# Patient Record
Sex: Male | Born: 1940
Health system: Southern US, Community
[De-identification: ages and names within clinical notes are randomized; demographics above are authoritative.]

## PROBLEM LIST (undated history)

## (undated) DIAGNOSIS — K219 Gastro-esophageal reflux disease without esophagitis: Secondary | ICD-10-CM

## (undated) DIAGNOSIS — I219 Acute myocardial infarction, unspecified: Secondary | ICD-10-CM

## (undated) DIAGNOSIS — J45909 Unspecified asthma, uncomplicated: Secondary | ICD-10-CM

## (undated) DIAGNOSIS — Z8701 Personal history of pneumonia (recurrent): Secondary | ICD-10-CM

## (undated) DIAGNOSIS — T8092XA Unspecified transfusion reaction, initial encounter: Secondary | ICD-10-CM

## (undated) DIAGNOSIS — E538 Deficiency of other specified B group vitamins: Principal | ICD-10-CM

## (undated) DIAGNOSIS — E039 Hypothyroidism, unspecified: Secondary | ICD-10-CM

## (undated) DIAGNOSIS — N289 Disorder of kidney and ureter, unspecified: Secondary | ICD-10-CM

## (undated) DIAGNOSIS — D472 Monoclonal gammopathy: Secondary | ICD-10-CM

## (undated) DIAGNOSIS — E785 Hyperlipidemia, unspecified: Secondary | ICD-10-CM

## (undated) DIAGNOSIS — I10 Essential (primary) hypertension: Secondary | ICD-10-CM

## (undated) HISTORY — DX: Deficiency of other specified B group vitamins: E53.8

## (undated) HISTORY — DX: Monoclonal gammopathy: D47.2

## (undated) HISTORY — DX: Personal history of pneumonia (recurrent): Z87.01

---
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
## 1977-11-22 DIAGNOSIS — T8092XA Unspecified transfusion reaction, initial encounter: Secondary | ICD-10-CM

## 1977-11-22 HISTORY — DX: Unspecified transfusion reaction, initial encounter: T80.92XA

## 1977-11-22 HISTORY — PX: CHOLECYSTECTOMY: SHX55

## 2001-03-22 ENCOUNTER — Ambulatory Visit (HOSPITAL_COMMUNITY): Admission: RE | Admit: 2001-03-22 | Discharge: 2001-03-22 | Payer: Self-pay | Admitting: Family Medicine

## 2001-03-22 ENCOUNTER — Encounter: Payer: Self-pay | Admitting: Family Medicine

## 2001-03-28 ENCOUNTER — Ambulatory Visit (HOSPITAL_COMMUNITY): Admission: RE | Admit: 2001-03-28 | Discharge: 2001-03-28 | Payer: Self-pay | Admitting: Family Medicine

## 2001-03-28 ENCOUNTER — Encounter: Payer: Self-pay | Admitting: Family Medicine

## 2001-04-10 ENCOUNTER — Encounter: Payer: Self-pay | Admitting: Family Medicine

## 2001-04-10 ENCOUNTER — Ambulatory Visit (HOSPITAL_COMMUNITY): Admission: RE | Admit: 2001-04-10 | Discharge: 2001-04-10 | Payer: Self-pay | Admitting: Family Medicine

## 2001-05-30 ENCOUNTER — Encounter: Payer: Self-pay | Admitting: *Deleted

## 2001-05-30 ENCOUNTER — Emergency Department (HOSPITAL_COMMUNITY): Admission: EM | Admit: 2001-05-30 | Discharge: 2001-05-31 | Payer: Self-pay | Admitting: *Deleted

## 2001-05-31 ENCOUNTER — Ambulatory Visit (HOSPITAL_COMMUNITY): Admission: RE | Admit: 2001-05-31 | Discharge: 2001-05-31 | Payer: Self-pay | Admitting: Internal Medicine

## 2001-05-31 ENCOUNTER — Encounter (INDEPENDENT_AMBULATORY_CARE_PROVIDER_SITE_OTHER): Payer: Self-pay | Admitting: Internal Medicine

## 2001-06-20 ENCOUNTER — Other Ambulatory Visit: Admission: RE | Admit: 2001-06-20 | Discharge: 2001-06-20 | Payer: Self-pay | Admitting: General Surgery

## 2001-07-12 ENCOUNTER — Ambulatory Visit (HOSPITAL_COMMUNITY): Admission: RE | Admit: 2001-07-12 | Discharge: 2001-07-12 | Payer: Self-pay | Admitting: Cardiology

## 2001-11-02 ENCOUNTER — Encounter: Payer: Self-pay | Admitting: *Deleted

## 2001-11-02 ENCOUNTER — Emergency Department (HOSPITAL_COMMUNITY): Admission: EM | Admit: 2001-11-02 | Discharge: 2001-11-02 | Payer: Self-pay | Admitting: *Deleted

## 2001-11-05 ENCOUNTER — Encounter: Payer: Self-pay | Admitting: *Deleted

## 2001-11-05 ENCOUNTER — Inpatient Hospital Stay (HOSPITAL_COMMUNITY): Admission: EM | Admit: 2001-11-05 | Discharge: 2001-11-09 | Payer: Self-pay | Admitting: *Deleted

## 2001-11-22 HISTORY — PX: STOMACH SURGERY: SHX791

## 2001-12-25 ENCOUNTER — Encounter: Payer: Self-pay | Admitting: General Surgery

## 2001-12-26 ENCOUNTER — Observation Stay (HOSPITAL_COMMUNITY): Admission: RE | Admit: 2001-12-26 | Discharge: 2001-12-27 | Payer: Self-pay | Admitting: General Surgery

## 2002-02-06 ENCOUNTER — Other Ambulatory Visit: Admission: RE | Admit: 2002-02-06 | Discharge: 2002-02-06 | Payer: Self-pay | Admitting: General Surgery

## 2003-05-31 ENCOUNTER — Emergency Department (HOSPITAL_COMMUNITY): Admission: EM | Admit: 2003-05-31 | Discharge: 2003-05-31 | Payer: Self-pay | Admitting: Emergency Medicine

## 2003-11-23 HISTORY — PX: HERNIA REPAIR: SHX51

## 2004-12-28 ENCOUNTER — Emergency Department (HOSPITAL_COMMUNITY): Admission: EM | Admit: 2004-12-28 | Discharge: 2004-12-28 | Payer: Self-pay | Admitting: Emergency Medicine

## 2005-11-29 ENCOUNTER — Ambulatory Visit (HOSPITAL_COMMUNITY): Admission: RE | Admit: 2005-11-29 | Discharge: 2005-11-29 | Payer: Self-pay | Admitting: Family Medicine

## 2005-12-18 ENCOUNTER — Emergency Department (HOSPITAL_COMMUNITY): Admission: EM | Admit: 2005-12-18 | Discharge: 2005-12-18 | Payer: Self-pay | Admitting: Emergency Medicine

## 2006-01-12 ENCOUNTER — Ambulatory Visit (HOSPITAL_COMMUNITY): Admission: RE | Admit: 2006-01-12 | Discharge: 2006-01-12 | Payer: Self-pay | Admitting: Family Medicine

## 2006-01-12 ENCOUNTER — Inpatient Hospital Stay (HOSPITAL_COMMUNITY): Admission: AD | Admit: 2006-01-12 | Discharge: 2006-01-15 | Payer: Self-pay | Admitting: Internal Medicine

## 2006-02-17 ENCOUNTER — Inpatient Hospital Stay (HOSPITAL_COMMUNITY): Admission: EM | Admit: 2006-02-17 | Discharge: 2006-02-19 | Payer: Self-pay | Admitting: Emergency Medicine

## 2006-03-15 ENCOUNTER — Ambulatory Visit (HOSPITAL_COMMUNITY): Admission: RE | Admit: 2006-03-15 | Discharge: 2006-03-15 | Payer: Self-pay | Admitting: Family Medicine

## 2006-04-21 ENCOUNTER — Encounter (HOSPITAL_COMMUNITY): Admission: RE | Admit: 2006-04-21 | Discharge: 2006-05-21 | Payer: Self-pay | Admitting: Endocrinology

## 2007-06-19 ENCOUNTER — Ambulatory Visit (HOSPITAL_COMMUNITY): Admission: RE | Admit: 2007-06-19 | Discharge: 2007-06-19 | Payer: Self-pay | Admitting: Family Medicine

## 2008-03-01 ENCOUNTER — Emergency Department (HOSPITAL_COMMUNITY): Admission: EM | Admit: 2008-03-01 | Discharge: 2008-03-01 | Payer: Self-pay | Admitting: Emergency Medicine

## 2008-03-03 ENCOUNTER — Emergency Department (HOSPITAL_COMMUNITY): Admission: EM | Admit: 2008-03-03 | Discharge: 2008-03-03 | Payer: Self-pay | Admitting: Emergency Medicine

## 2008-03-12 ENCOUNTER — Ambulatory Visit (HOSPITAL_COMMUNITY): Admission: RE | Admit: 2008-03-12 | Discharge: 2008-03-12 | Payer: Self-pay | Admitting: Family Medicine

## 2008-03-28 ENCOUNTER — Ambulatory Visit (HOSPITAL_COMMUNITY): Admission: RE | Admit: 2008-03-28 | Discharge: 2008-03-28 | Payer: Self-pay | Admitting: Internal Medicine

## 2008-03-28 ENCOUNTER — Encounter: Payer: Self-pay | Admitting: Internal Medicine

## 2008-03-28 ENCOUNTER — Ambulatory Visit: Payer: Self-pay | Admitting: Internal Medicine

## 2008-03-28 HISTORY — PX: COLONOSCOPY: SHX174

## 2008-12-05 ENCOUNTER — Ambulatory Visit (HOSPITAL_COMMUNITY): Admission: RE | Admit: 2008-12-05 | Discharge: 2008-12-05 | Payer: Self-pay | Admitting: Family Medicine

## 2010-12-13 ENCOUNTER — Encounter: Payer: Self-pay | Admitting: Endocrinology

## 2011-04-06 NOTE — Op Note (Signed)
NAMEMARILYN, [name]                ACCOUNT NO.:  [id number]   MEDICAL RECORD NO.:  [id number]          PATIENT TYPE:  AMB   LOCATION:  DAY                           FACILITY:  [facility]   PHYSICIAN:  R. Roetta Sessions, M.D. DATE OF BIRTH:  [birth year]   DATE OF PROCEDURE:  03/28/2008  DATE OF DISCHARGE:                               OPERATIVE REPORT   INDICATIONS FOR PROCEDURE:  A 70 year old gentleman seen in courtesy of  Dr. Nobie Putnam for colorectal cancer screening.  He is devoid of any lower  GI tract symptoms.  He has had a sigmoidoscopy by me back in 2000, which  revealed diverticula.  No family history of colorectal neoplasia.  Colonoscopy is now being done as standard screening maneuver.  Risks,  benefits, alternatives, and limitations have been reviewed, and  questions were answered.  He is agreeable.  Please see documentation in  the medical record.   PROCEDURE NOTE:  O2 saturation, blood pressure, pulse, and respirations  were monitored throughout the entire procedure.   CONSCIOUS SEDATION:  Versed 5 mg IV and fentanyl 100 mcg IV in divided  doses.   INSTRUMENTATION:  Pentax video chip system.   FINDINGS:  Digital rectal exam revealed no abnormalities.  Endoscopic  findings:  The prep was adequate.  Colon:  Colonic mucosa was surveyed  from the rectosigmoid junction through the left transverse right colon  to this orifice, ileocecal valve, and cecum.  These structures well seen  photographed for the record.  From this level the scope was slowly  cautiously withdrawn.  All previous mentioned mucosal surfaces were  again seen.  The patient was noted to have extensive left-sided  transverse diverticula.  There is a 4-mm polyp in the mid ascending  colon, which was cold biopsied/removed totally with one pass of cold  biopsy forceps.  Remainder colonic mucosa appeared normal.  The scope  was pulled down the rectum with examination of rectal mucosa including  retroflex view of the  anal verge demonstrated no abnormalities.  The  patient tolerated the procedure well and was reactive in endoscopy.   IMPRESSION:  Normal rectum; left-sided transverse diverticula;  diminutive polyp; descending colon, status post cold biopsy removal.  The remainder of colonic mucosa appeared normal.   RECOMMENDATIONS:  1. Diverticulosis/polyp literature provided to [name].  2. Follow up on path.  3. Further recommendations to follow.      [name], M.D.  Electronically Signed     RMR/MEDQ  D:  03/28/2008  T:  03/29/2008  Job:  [id number]   cc:   Patrica Duel, M.D.  Fax: [phone]

## 2011-04-09 NOTE — Consult Note (Signed)
[name], [name]                ACCOUNT NO.:  [id number]   MEDICAL RECORD NO.:  [id number]          PATIENT TYPE:  INP   LOCATION:  A330                          FACILITY:  [facility]   PHYSICIAN:  Edward L. Juanetta Gosling, M.D.DATE OF BIRTH:  May 25, 1941   DATE OF CONSULTATION:  02/18/2006  DATE OF DISCHARGE:  02/19/2006                                   CONSULTATION   INTERNAL MEDICINE CONSULTATION:   PRIMARY CARE PHYSICIANS:  Patient of Dr. Nobie Putnam and Dr. Regino Schultze.   REASON FOR CONSULTATION:  Shortness of breath and wheezing.   HISTORY:  [name] is a 70 year old who has had a several-day history of  shortness of breath.  He says that all of this started in August 2006 while  he was visiting the [facility] area.  He said he had an episode of  wheezing, became very congested and that this has persisted intermittently  since then.  He seemed to get a little better during the wintertime but he  has had more trouble since then.  He was in the hospital in February and was  undergoing cardiac workup.  It is of note that during these episodes he  complains of a feeling of something sitting on his chest that feels like a  pressure-like chest discomfort or a concrete block.  He coughs when he has  these and they almost always occur at night although he says yesterday he  had more trouble he thinks because he was putting out fertilizer and lime in  his yard.  He had been using a nebulizer at home.  When he came to the  emergency room, he was quite tight but got better.   MEDICAL HISTORY:  His past medical history is positive for hyperlipidemia  and hypertension.  He has been on Accupril but he has been off now for about  a month so I do not think that this is any form of ACE-related angioedema or  cough.  He has a history of several surgeries including gallbladder surgery  that apparently was complicated, surgery for abdominal obstruction that  apparently was related to lipomas and a history  of hernia surgery.   MEDICATIONS:  He has been on cephalexin, Vicodin, Singulair 10 mg daily,  Norvasc 5 mg daily, metoprolol 25 mg daily.   ALLERGIES:  He is allergic to [facility].   SOCIAL HISTORY:  He is retired.  He was a Chartered loss adjuster.  He never smoked.  He says he drinks alcohol maybe once every 2 months with a glass of wine.  He has had no drug use.  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   REVIEW OF SYSTEMS:  Review of systems otherwise is pretty much negative  except he says these things happen at night and he thinks he may have some  reflux symptoms.   PHYSICAL EXAMINATION:  GENERAL:  He is a well-developed, well-nourished male  who is in no acute distress now.  VITAL SIGNS:  Temperature is 98, pulse 119, respirations 20, blood pressure  103/66, O2 saturation is 98% on 2 L.  HEENT:  Pupils are equal, round, react to light and accommodation.  His nose  and throat are clear.  His neck is supple without masses.  He does not have  any jugular venous distension.  CHEST:  His chest is actually clear now.  His heart is regular without  gallop.  ABDOMEN:  Soft without masses.  EXTREMITIES:  No edema.  CNS:  Examination is grossly intact.   STUDIES:  Chest x-ray no active disease.  He had a CT angio of the chest in  February of pulmonary embolism protocol which showed normal pulmonary  arteries but did show an area that his thyroid did not look exactly normal.  He had echocardiogram that apparently was normal.   ASSESSMENT AND PLAN:  He has got shortness of breath.  I am concerned that  this may be reflux and aspiration or at least reflux causing bronchospasm.  I agree with use of Protonix.  He may need a GI consultation.  I agree with  going ahead with PFTs.  I do not think there is anything to add as far as  his therapy is concerned.      Edward L.  Juanetta Gosling, M.D.  Electronically Signed     ELH/MEDQ  D:  02/18/2006  T:  02/19/2006  Job:  [id number]

## 2011-04-09 NOTE — Op Note (Signed)
[facility]  Patient:    [name], [name] Visit Number: [id number] MRN: [id number]          Service Type: DSU Location: 3A A336 01 Attending Physician:  Dessa Phi Dictated by:   Elpidio Anis, M.D. Admit Date:  12/26/2001                             Operative Report  PREOPERATIVE DIAGNOSIS:  Left inguinal hernia.  POSTOPERATIVE DIAGNOSIS:  Left inguinal hernia.  PROCEDURE:  Mesh graft repair of left inguinal hernia.  SURGEON:  Elpidio Anis, M.D.  DESCRIPTION:  Under spinal anesthesia, the left inguinal area was prepped and draped in a sterile field.  A curvilinear lower inguinal incision was made. The incision was extended through the aponeurosis.  The aponeurosis was opened.  The cord was mobilized.  There was a very large amount of fat involving the cord.  There were two large lipomas of the cord.  The larger one was the protruding point, which appeared to be hernia.  It was explored, and there was no evidence of hernia sac.  It was dissected back to the internal ring, suture ligated, and excised. A small lipoma of the cord was excised in a similar manner.  Further exploration revealed no evidence of indirect sac. The inguinal floor was markedly attenuated.  A Marlex mesh graft was chosen. It was sutured to the Coopers ligament and the iliopubic tract and the conjoined tendon using running 0 Prolene.  The cord was placed in anatomic position.  The aponeurosis was closed with 3-0 ______.  The skin was closed with subcuticular 4-0 Dexon.  The patient tolerated the procedure well.  Local infiltration with 0.5% Marcaine with epinephrine was carried out.  A sterile dressing was placed.  He was awakened from anesthesia and transferred to a bed and taken to the postanesthetic care unit. Dictated by:   Elpidio Anis, M.D. Attending Physician:  Dessa Phi DD:  12/26/01 TD:  12/26/01 Job: [id number]

## 2011-04-09 NOTE — Discharge Summary (Signed)
[name], [name]                ACCOUNT NO.:  [id number]   MEDICAL RECORD NO.:  [id number]          PATIENT TYPE:  INP   LOCATION:  A208                          FACILITY:  [facility]   PHYSICIAN:  Madelin Rear. Sherwood Gambler, MD  DATE OF BIRTH:  [birth year]   DATE OF ADMISSION:  01/12/2006  DATE OF DISCHARGE:  02/24/2007LH                                 DISCHARGE SUMMARY   DISCHARGE MEDICATIONS:  1.  Keflex 500 mg t.i.d.  2.  Zithromax pack x5 days.  3.  Albuterol two puffs four times a day.  4.  Prednisone taper over 7 days.  5.  Metoprolol 12.5 mg p.o. b.i.d.  6.  Norvasc 5 mg p.o. daily.  7.  Accupril 5 mg p.o. daily.   DISCHARGE DIAGNOSES:  1.  Hypertension.  2.  Chest pain, noncardiac.  3.  Dyspnea secondary to exacerbation of bronchospasm.  4.  Hyperlipidemia.  5.  Hyperglycemia.   HOSPITAL COURSE:  The patient was admitted with shortness of breath and  orthopnea.  He also had some atypical chest pain and was worked up in depth  by cardiology.  He improved gratifyingly through interventions and was  discharged after successful treatment with bronchodilators and antibiotics.   FOLLOW UP:  Follow up in the office.      Madelin Rear. Sherwood Gambler, MD  Electronically Signed     LJF/MEDQ  D:  02/08/2006  T:  02/08/2006  Job:  [id number]

## 2011-04-09 NOTE — H&P (Signed)
NAMEYOBANI, SCHERTZER                ACCOUNT NO.:  [id number]   MEDICAL RECORD NO.:  [id number]          PATIENT TYPE:  INP   LOCATION:  A208                          FACILITY:  [facility]   PHYSICIAN:  Madelin Rear. Sherwood Gambler, MD  DATE OF BIRTH:  28-May-1941   DATE OF ADMISSION:  01/12/2006  DATE OF DISCHARGE:  LH                                HISTORY & PHYSICAL   CHIEF COMPLAINT:  [name] is admitted for shortness of breath,  orthopnea, and chest tightness.   HISTORY OF PRESENT ILLNESS:  Mr. Fedora is a 70 year old white male who is  admitted from our office with a four week history of cough, congestion, and  wheezing.  He has orthopnea and has to sleep in a recliner.  He also has  some chest tightness when he lays down.  The tightness goes away when  standing or sitting.  He has also had some associated head congestion  without mucus production.  He has also had some itching along his trunk off  and on for the past month.  He was seen in the emergency room and given I  believe a nebulizer treatment approximately one month ago with some relief.   REVIEW OF SYSTEMS:  He denies any fevers, chills, weight loss, or night  sweats.  No sore throat or earache.  No productive cough.  No nausea,  vomiting, constipation, diarrhea.  No reflux symptoms or abdominal pain.  No  urinary symptoms are reported.   PAST MEDICAL HISTORY:  1.  Hypertension.  2.  Hyperlipidemia.   PAST SURGICAL HISTORY:  1.  Tonsillectomy.  2.  Appendectomy.  3.  Cholecystectomy.   FAMILY HISTORY:  His dad is alive at 64 years old and has CHF and other  chronic medical conditions and is in declining health.  His mom died at 3  years old and had coronary artery disease.  Within the past year, he has  experienced the death of one of his sisters and a brother most recently in  the past two months.  His other siblings are alive and well except with  diagnosis of hypertension.  No diabetes or cancers are reported.   SOCIAL HISTORY:  Negative for tobacco, alcohol, or drug use.  He is retired  as a Chartered loss adjuster.  He is married with one son.   CURRENT MEDICATIONS:  Accupril 5 mg p.o. daily.   ALLERGIES:  1.  DEMEROL.  2.  AUGMENTIN.  3.  PHENERGAN.  4.  LEVAQUIN.  5.  He states STEROIDS cause fluid retention.   PHYSICAL EXAMINATION:  GENERAL:  He is alert and oriented, in no acute  distress.  Is slightly tearful when talking about the recent death in his  family.  VITAL SIGNS:  Temperature is 96.8, blood pressure is 140/84, weight is 206.  HEENT:  PERRLA.  EOMI.  Buccal mucosa is moist without lesions.  He does  have some post-nasal drip and throat is erythematous without exudate.  NECK:  Supple without adenopathy.  HEART:  Regular rate and rhythm without murmur.  LUNGS:  He does have  a few scattered wheezes throughout the lung fields.  ABDOMEN:  Bowel sounds are positive, soft, nontender, no masses or  organomegaly.  He does have well-healed surgical scars from the  cholecystectomy.  EXTREMITIES:  No cyanosis, clubbing, or edema.  BACK:  Negative for CVA tenderness.  SKIN:  Without rash, warm, and dry.   LABORATORY DATA:  EKG in our office showed nonspecific ST changes which is  slightly different from past examination in June 2006, in our office.  Chest  x-ray was negative for acute findings and no evidence of CHF present.   ASSESSMENT:  1.  Orthopnea.  2.  Dyspnea.  3.  Chest pain.  4.  Hypertension.  5.  Wheezing.   PLAN:  He is admitted for labs, including CBC, CMET, cardiac enzymes,  troponin, and D-dimer.  He will be given Solu-Medrol 20 mg intravenously  q.6h., and Xopenex 1.25 mg per nebulizer q.i.d.  Zithromax 500 mg daily,  Keflex 500 mg q.i.d., Accupril 5 mg p.o. daily, Atarax 25 mg p.o. q.i.d. for  itching, and consult Dr. Domingo Sep in the morning.      Melony Overly, PA      Madelin Rear. Sherwood Gambler, MD  Electronically Signed    TH/MEDQ  D:  01/12/2006  T:  01/12/2006   Job:  [id number]

## 2011-04-09 NOTE — H&P (Signed)
[facility]  Patient:    [name], [name] Visit Number: [id number] MRN: [id number]          Service Type: MED Location: 3A [phone] Attending Physician:  Kirk Ruths Dictated by:   Elpidio Anis, M.D. Admit Date:  11/05/2001 Discharge Date: 11/09/2001   CC:         Patrica Duel, M.D.   History and Physical  CHIEF COMPLAINT:  This is a 70 year old male, with a history of acute onset of pain and swelling in the left inguinal area.  HISTORY OF PRESENT ILLNESS:  The patient noted increased swelling with coughing, straining, and Valsalva movements.  He was seen by Dr. Nobie Putnam and referred to the office, where examination revealed a left inguinal hernia. Because of increasing symptoms he is scheduled for left inguinal hernia repair.  PAST MEDICAL HISTORY:  1. Hypertension.  2. Diagnosis of mesenteric adenitis, diagnosed in July 2001.  He has mild     baseline abdominal discomfort.  3. He was recently treated for pneumonia in December 2002 and this has     resolved.  4. History of peptic ulcer disease.  5. Diverticulosis diagnosed on sigmoidoscopy.  PAST SURGICAL HISTORY:  1. Exploratory laparotomy.  2. Cholecystectomy.  3. Hemorrhoidectomy.  4. Appendectomy.  ALLERGIES:  1. MEPERGAN.  2. CODEINE.  SOCIAL HISTORY:  He is married.  He does not drink or smoke.  He is a retired Merchandiser, retail.  FAMILY HISTORY:  Unremarkable.  PHYSICAL EXAMINATION:  VITAL SIGNS:  Blood pressure 130/80, pulse 80, respirations 20, temperature 98 degrees.  HEENT:  No abnormality noted.  NECK:  Supple.  No JVD or bruits.  No thyromegaly or adenopathy.  CHEST:  Good breath sounds bilaterally.  No rales, rubs, rhonchi, or wheezes.  HEART:  Regular rate and rhythm without murmur, gallop, or rub.  ABDOMEN:  Soft, nontender, no masses.  Normoactive bowel sounds.  Hernia easily palpable, enlarged left inguinal hernia which extends down to  the superior aspect of the scrotum.  Mild tenderness.  GU:  Genitalia reveals no testicular abnormality.  EXTREMITIES:  No joint deformity, no clubbing, cyanosis, or edema.  NEUROLOGIC:  No motor, sensory, or cerebellar deficit.  IMPRESSION:  1. Left inguinal hernia.  2. Hypertension.  3. History of mesenteric adenitis.  4. History of peptic ulcer disease.  5. Diverticulosis.  PLAN:  The patient will have left inguinal hernia repair under spinal anesthesia. Dictated by:   Elpidio Anis, M.D. Attending Physician:  Kirk Ruths DD:  12/25/01 TD:  12/25/01 Job: [id number]

## 2011-04-09 NOTE — Discharge Summary (Signed)
[facility]  Patient:    [name], [name] Visit Number: [id number] MRN: [id number]          Service Type: MED Location: 3A [phone] Attending Physician:  Kirk Ruths Dictated by:   Karleen Hampshire, M.D. Admit Date:  11/05/2001 Discharge Date: 11/09/2001                             Discharge Summary  DISCHARGE DIAGNOSES: 1. Respiratory stress secondary to severe bronchitis. 2. History of hypertension. 3. Left inguinal hernia.  HOSPITAL COURSE:  This 70 year old male was admitted to the emergency room and was seen there 4 days before admission.  She was treated with Augmentin, Robitussin, for upper respiratory infection.  The patient had progressive fever, aches, and chills with productive cough and shortness of breath.  On presentation on the day of admission he had a pO2 of 59 on room air.  X-ray was normal.  White count was 13,000.  The patient was admitted for atypical pneumonia and respiratory toiletry.  The patient on admission was given Solu-Medrol a one time dose and begun on IV Levaquin, and intensive nebulizer treatments and mucolytics.  The patient remained afebrile throughout his stay although his respiratory status was slow and improving.  He initially was placed also on p.o. prednisone 30 mg q.d. and at the time of discharge his lungs had cleared and no respiratory distress.  DISPOSITION/MEDICATIONS:  He is discharged home on his previous medications which included: 1. Accupril for hypertension. 2. Continued on Levaquin for 7 more days 3. Robitussin.  FOLLOWUP:  He will be followed in the office as needed. Dictated by:   Karleen Hampshire, M.D. Attending Physician:  Kirk Ruths DD:  11/09/01 TD:  11/09/01 Job: [id number]

## 2011-04-09 NOTE — Procedures (Signed)
NAMEBRYDON, [name]                ACCOUNT NO.:  [id number]   MEDICAL RECORD NO.:  [id number]          PATIENT TYPE:  INP   LOCATION:  A208                          FACILITY:  [facility]   PHYSICIAN:  Dani Gobble, MD       DATE OF BIRTH:  October 19, 1941   DATE OF PROCEDURE:  01/13/2006  DATE OF DISCHARGE:                                  ECHOCARDIOGRAM   REFERRING PHYSICIAN:  Madelin Rear. Sherwood Gambler, M.D./Ann Domingo Sep, M.D.   INDICATIONS:  A 70 year old gentleman with past medical history of  hypertension who was admitted with chest pain and shortness breath as well  as dyspnea on exertion who is referred for evaluation of LV function.   The technical quality of the study is reasonable.   M-MODE TRACINGS:  The aorta measures normally at 3.3 cm.   The left atrium is at the upper limits of normal at 4.1 cm.  No obvious  clots, masses were appreciated and the patient appeared to be in borderline  sinus tachycardia during the procedure.   The interventricular septum and posterior wall are mildly thickened measured  at 1.4 cm and 1.3 cm, respectively.   The aortic valve is not well-visualized, but is probably a trileaflet valve.  Leaflet excursion appears normal.  No significant aortic insufficiency is  noted.  Doppler interrogation of the aortic valve is within normal limits.   Mitral valve is not well-visualized, but appeared to be grossly structurally  normal.  No obvious mitral valve prolapse is appreciated. Mild mitral  regurgitation is noted.  Doppler interrogation of mitral valve reveals  minimally elevated velocities across the valve itself.   Pulmonic valve is not well-visualized.   Tricuspid valve appears grossly structurally normal.  Mild tricuspid  regurgitation is noted.   The left ventricle is normal in size with the LVIDD measured at 4.2 cm and  the LVISD measured at 2.3 cm.  Overall, left ventricular systolic function  appears quite vigorous to hyperdynamic.  No regional wall  motion  abnormalities are noted.  The presence of diastolic dysfunction is inferred  from pulse wave Doppler across the mitral valve.   The right atrium appears normal in size and the right ventricle appears  moderately dilated with moderate to marked RVH.  Right ventricle systolic  function is normal.   There is a suggestion of a small anterior echo-free space also at the apex  which may represent a fat pad versus a small pericardial effusion.  If  indeed it is a pericardial effusion, there is no hemodynamic compromise.  Additionally, the pericardium itself appears thickened, although not  particularly echogenic.   IMPRESSION:  1.  Left atrium at the upper limits of normal.  2.  Mild concentric left ventricular hypertrophy.  3.  The mitral valve structurally appears reasonably normal, although the      velocities across the valve on Doppler interrogation are mildly      elevated.  There does not appear to be limitation of leaflet excursion      nor does there appear to be significant prolapse.  Mild mitral  regurgitation is noted.  4.  Mild tricuspid regurgitation.  5.  Normal left ventricular size with vigorous to hyperdynamic left      ventricular systolic function.  No regional wall motion abnormalities      noted.  6.  Presence of diastolic dysfunction is inferred from pulsar Doppler across      the mitral valve.  7.  Moderate right ventricular enlargement with moderate to marked right      ventricular hypertrophy and normal right ventricle systolic function.  8.  There appears to be an echo-free space anteriorly and at the apex which      may represent fat pad versus a small pericardial effusion. If it indeed      it is a pericardial effusion, there is no hemodynamic compromise.  9.  Additionally, the pericardium appears itself to be thickened, although      not particularly echogenic.           ______________________________  Dani Gobble, MD     AB/MEDQ  D:   01/13/2006  T:  01/13/2006  Job:  [id number]

## 2011-04-09 NOTE — Discharge Summary (Signed)
NAMEPAULINE, [name]                ACCOUNT NO.:  [id number]   MEDICAL RECORD NO.:  [id number]          PATIENT TYPE:  INP   LOCATION:  A330                          FACILITY:  [facility]   PHYSICIAN:  Kirk Ruths, M.D.DATE OF BIRTH:  11-25-1940   DATE OF ADMISSION:  02/17/2006  DATE OF DISCHARGE:  03/31/2007LH                                 DISCHARGE SUMMARY   DISCHARGE DIAGNOSES:  1.  Status asthmaticus.  2.  Gastroesophageal reflux disease.  3.  Nontoxic thyroid goiter.  4.  Hypertension.   HOSPITAL COURSE:  This is a 70 year old male who had been previously  healthy.  The patient was admitted approximately a month ago for similar  problem breathing and while improved, he is gotten progressively worse over  the last several days.  The patient's blood gases on room air showed a pH  7.29, pCO2 43, pO2 67 on admission.  White count was 11,900 with a  hemoglobin of 14.6.  D-dimer was in the upper limits of 48.  Electrolytes  were within normal range.  Cardiac enzymes were negative.  The patient's  thyroid studies showed TSH slightly low at 0.052.  Free T4 of 1.51, free T3  of 2.8.  Thyroglobulin antibodies were 32.9 which is elevated.  Should be  about 20.  Thyroglobulin was 24.2, within normal range.  The patient's chest  x-ray showed no active disease or infiltrates .   The patient was admitted to the floor and put on IV Solu-Medrol, Xopenex  nebulizers, Xanax, Norvasc, Singulair, and supplemental oxygen.  He was seen  in consultation by Dr. Kari Baars from pulmonary who helped adjust his  medications.  Dr. Juanetta Gosling felt possibly COPD or asthma could be exacerbated  by silent GERD for which he was placed on Protonix.  The patient also was  noted on his previous admission to have a __________  thyroid. Ultrasound at  that time showed a 4.5 x 3 x 3.3 cm, mid/inferior left thyroid nodule,  He  was seen in consultation by Dr. Patrecia Pace for his thyroid problem.  Thyroid  __________   were ordered.  He will be followed by Dr. Patrecia Pace as an  outpatient for further definition of his problem.  The patient improved. He  had significant improvement in his wheezing, maintaining his oxygen  saturations.  He underwent pulmonary function studies which are consistent  with asthma.   CONDITION ON DISCHARGE:  The patient was discharged home in stable  condition.   DISCHARGE MEDICATIONS:  1.  Protonix was added to his regimen.  2.  Singulair 10 mg daily.  3.  Asmanex .  4.  Xopenex nebulizers.  5.  ProAir .   FOLLOW UP:  He will be followed in the office as needed.  An appointment was  made for him.  He is supposed to call Dr. Patrecia Pace office for an  appointment.      Kirk Ruths, M.D.  Electronically Signed     WMM/MEDQ  D:  04/14/2006  T:  04/14/2006  Job:  [id number]

## 2011-04-09 NOTE — H&P (Signed)
NAMEWYETT, NARINE                ACCOUNT NO.:  [id number]   MEDICAL RECORD NO.:  [id number]          PATIENT TYPE:  INP   LOCATION:  A330                          FACILITY:  [facility]   PHYSICIAN:  Kirk Ruths, M.D.DATE OF BIRTH:  [birth year]   DATE OF ADMISSION:  02/17/2006  DATE OF DISCHARGE:  LH                                HISTORY & PHYSICAL   CHIEF COMPLAINT:  Shortness of breath.   HISTORY OF PRESENT ILLNESS:  This is a 70 year old male who has had a  several day history of progressively shortness of breath. He had a similar  admission approximately a month ago. The patient states his home medications  were not relieving his shortness of breath. He presented to the emergency  room where he was found to be extremely tight with junky sounding congested  lungs. The patient finally responded to second nebulizer treatment and  morphine and Zofran. His blood gases are abnormal, and with the recurrent  nature of this significant problem, he is admitted for further evaluation  and treatment.   PAST MEDICAL HISTORY:  1.  He has a history of hyperlipidemia.  2.  Hypertension.  3.  Previous history of asthmatic bronchitis.   MEDICATIONS:  1.  Cephalexin.  2.  Vicodin as needed.  3.  Singulair 10 mg daily.  4.  Norvasc 5 mg daily.  5.  Metoprolol 25 once a day.   ALLERGIES:  The patient states he is allergic to [facility] IV; it makes him  go blind and wild.   REVIEW OF SYSTEMS:  Denies chest pain or nausea or vomiting or diarrhea.   SOCIAL HISTORY:  The patient is retired married state trooper who has never  smoked or drank or had drug use.   PHYSICAL EXAMINATION:  GENERAL:  Well-developed, middle-aged male who is  apprehensive.  VITAL SIGNS:  He is afebrile. Blood pressure is 140/80, pulse is 100 and  regular. Respirations 24 and somewhat shallow. O2 saturations are 98% on 2  liters.  HEENT:  TMs are normal. Pupils are equal and reactive to accommodation.  Oropharynx  benign.  NECK:  Supple without JVD, bruit or thyromegaly.  LUNGS:  Diffuse wheezing and rhonchi throughout.  HEART:  Regular sinus rhythm.  ABDOMEN:  Soft and nontender.  EXTREMITIES:  Without clubbing, cyanosis, or edema.  NEUROLOGICAL:  Grossly intact.   ASSESSMENT:  1.  Asthmatic bronchitis.  2.  Hypertension.   PLAN:  Will admit. Will hold Lopressor. Treat with IV steroids and  nebulizers. Pulmonary consult.      Kirk Ruths, M.D.  Electronically Signed     WMM/MEDQ  D:  02/17/2006  T:  02/18/2006  Job:  [id number]

## 2011-04-09 NOTE — Procedures (Signed)
[name], [name]                ACCOUNT NO.:  [id number]   MEDICAL RECORD NO.:  [id number]          PATIENT TYPE:  INP   LOCATION:  A330                          FACILITY:  [facility]   PHYSICIAN:  Edward L. Juanetta Gosling, M.D.DATE OF BIRTH:  October 09, 1941   DATE OF PROCEDURE:  DATE OF DISCHARGE:  02/19/2006                              PULMONARY FUNCTION TEST   1.  Spirometry shows a mild ventilatory defect with evidence of airflow      obstruction.  2.  Lung volumes are normal.  3.  DLCO is normal.  4.  There is significant bronchodilator response.      Edward L. Juanetta Gosling, M.D.  Electronically Signed     ELH/MEDQ  D:  02/21/2006  T:  02/21/2006  Job:  [id number]   cc:   Kirk Ruths, M.D.  Fax: [phone]

## 2011-04-09 NOTE — Group Therapy Note (Signed)
NAMEHERSON, PRICHARD                ACCOUNT NO.:  [id number]   MEDICAL RECORD NO.:  [id number]          PATIENT TYPE:  INP   LOCATION:  A330                          FACILITY:  [facility]   PHYSICIAN:  Edward L. Juanetta Gosling, M.D.DATE OF BIRTH:  03-15-1941   DATE OF PROCEDURE:  02/19/2006  DATE OF DISCHARGE:  02/19/2006                                   PROGRESS NOTE   Patient of Dr. Edison Simon.   Mr. Stamos says he feels great and wants to go home.  He had his pulmonary  function test done yesterday and it did show that he had what appears to be  asthma.  I think still he has asthma that is precipitated by him having  reflux and I think we should treat the reflux.  I have discussed all this  with the patient and with Dr. Regino Schultze.  Plan is for him to be discharged  home today on Protonix 40 mg daily, to continue with his other medications  as before.      Edward L. Juanetta Gosling, M.D.  Electronically Signed     ELH/MEDQ  D:  02/19/2006  T:  02/21/2006  Job:  [id number]

## 2011-04-09 NOTE — H&P (Signed)
[facility]  Patient:    [name], [name] Visit Number: [id number] MRN: [id number]          Service Type: EMS Location: ED Attending Physician:  Rosalyn Charters Dictated by:   Karleen Hampshire, M.D. Admit Date:  11/02/2001 Discharge Date: 11/02/2001                           History and Physical  CHIEF COMPLAINT:  Aching and fever.  HISTORY OF PRESENT ILLNESS:  This is a 70 year old white male who had been seen in the emergency room approximately four days before admission and treated with Augmentin and Robitussin for upper respiratory tract infection. The patient has progressively had aching and fever with progressive cough.  In the emergency room, he was found to have a diminished pO2 of 59 with a pH of 7.42 and a pCO2 of 42.  The patients chest x-ray was not significant.  The white count was 13,000.  He is admitted for presumable atypical pneumonia.  ALLERGIES:  He is allergic to [facility].  It makes his throat close up in the IV form.  Also, CODEINE makes him hurt in his chest.  The patient can tolerate Vicodin.  PAST MEDICAL HISTORY:  The patient has hypertension which is controlled with Accupril 10 mg.  The patient underwent an exploratory laparotomy approximately a year ago for lysis of adhesions.  SOCIAL HISTORY:  The patient is a retired Chartered loss adjuster.  He denies smoking and alcohol use.  PHYSICAL EXAMINATION:  GENERAL APPEARANCE:  A middle-aged white male who appears miserable.  VITAL SIGNS:  Temperature 98.6 degrees, pulse 110 and regular, respirations 20 and unlabored, but swallow, blood pressure 130/75.  The patients O2 saturation on 2 L is 96%.  HEENT:  Pupils are equal to light and accommodation.  Oropharynx benign.  NECK:  Supple without JVD, bruit, or thyromegaly.  LUNGS:  Rales, wheezes, and rhonchi, especially on the right.  HEART:  Regular sinus rhythm without murmurs, rubs, or gallops.  ABDOMEN:  Soft and  nontender.  EXTREMITIES:  Without clubbing, cyanosis, or edema.  NEUROLOGIC:  Grossly intact.  ASSESSMENT: 1. Atypical pneumonia. 2. Hypertension. Dictated by:   Karleen Hampshire, M.D. Attending Physician:  Rosalyn Charters DD:  11/05/01 TD:  11/05/01 Job: [id number]

## 2011-05-06 ENCOUNTER — Ambulatory Visit (HOSPITAL_COMMUNITY)
Admission: RE | Admit: 2011-05-06 | Discharge: 2011-05-06 | Disposition: A | Discharge status: Home / Self Care | Payer: BC Managed Care – PPO | Source: Ambulatory Visit | Attending: Orthopedic Surgery | Admitting: Orthopedic Surgery

## 2011-05-06 DIAGNOSIS — R262 Difficulty in walking, not elsewhere classified: Secondary | ICD-10-CM | POA: Insufficient documentation

## 2011-05-06 DIAGNOSIS — IMO0001 Reserved for inherently not codable concepts without codable children: Secondary | ICD-10-CM | POA: Insufficient documentation

## 2011-05-06 DIAGNOSIS — M25673 Stiffness of unspecified ankle, not elsewhere classified: Secondary | ICD-10-CM | POA: Insufficient documentation

## 2011-05-06 DIAGNOSIS — M25579 Pain in unspecified ankle and joints of unspecified foot: Secondary | ICD-10-CM | POA: Insufficient documentation

## 2011-05-06 DIAGNOSIS — M25676 Stiffness of unspecified foot, not elsewhere classified: Secondary | ICD-10-CM | POA: Insufficient documentation

## 2011-05-06 DIAGNOSIS — I10 Essential (primary) hypertension: Secondary | ICD-10-CM | POA: Insufficient documentation

## 2011-05-06 DIAGNOSIS — M6281 Muscle weakness (generalized): Secondary | ICD-10-CM | POA: Insufficient documentation

## 2011-05-12 ENCOUNTER — Ambulatory Visit (HOSPITAL_COMMUNITY)
Admission: RE | Admit: 2011-05-12 | Discharge: 2011-05-12 | Disposition: A | Discharge status: Home / Self Care | Payer: BC Managed Care – PPO | Source: Ambulatory Visit | Attending: Family Medicine | Admitting: Family Medicine

## 2011-05-13 ENCOUNTER — Ambulatory Visit (HOSPITAL_COMMUNITY)
Admission: RE | Admit: 2011-05-13 | Discharge: 2011-05-13 | Disposition: A | Discharge status: Home / Self Care | Payer: BC Managed Care – PPO | Source: Ambulatory Visit | Attending: Family Medicine | Admitting: Family Medicine

## 2011-05-19 ENCOUNTER — Ambulatory Visit (HOSPITAL_COMMUNITY): Payer: BC Managed Care – PPO | Admitting: Physical Therapy

## 2011-05-24 ENCOUNTER — Ambulatory Visit (HOSPITAL_COMMUNITY)
Admission: RE | Admit: 2011-05-24 | Discharge: 2011-05-24 | Disposition: A | Discharge status: Home / Self Care | Payer: BC Managed Care – PPO | Source: Ambulatory Visit | Attending: Family Medicine | Admitting: Family Medicine

## 2011-05-24 DIAGNOSIS — M6281 Muscle weakness (generalized): Secondary | ICD-10-CM | POA: Insufficient documentation

## 2011-05-24 DIAGNOSIS — M25676 Stiffness of unspecified foot, not elsewhere classified: Secondary | ICD-10-CM | POA: Insufficient documentation

## 2011-05-24 DIAGNOSIS — M25673 Stiffness of unspecified ankle, not elsewhere classified: Secondary | ICD-10-CM | POA: Insufficient documentation

## 2011-05-24 DIAGNOSIS — M25579 Pain in unspecified ankle and joints of unspecified foot: Secondary | ICD-10-CM | POA: Insufficient documentation

## 2011-05-24 DIAGNOSIS — R262 Difficulty in walking, not elsewhere classified: Secondary | ICD-10-CM | POA: Insufficient documentation

## 2011-05-24 DIAGNOSIS — IMO0001 Reserved for inherently not codable concepts without codable children: Secondary | ICD-10-CM | POA: Insufficient documentation

## 2011-05-24 DIAGNOSIS — I10 Essential (primary) hypertension: Secondary | ICD-10-CM | POA: Insufficient documentation

## 2011-05-27 ENCOUNTER — Ambulatory Visit (HOSPITAL_COMMUNITY)
Admission: RE | Admit: 2011-05-27 | Discharge: 2011-05-27 | Disposition: A | Discharge status: Home / Self Care | Payer: BC Managed Care – PPO | Source: Ambulatory Visit | Attending: Family Medicine | Admitting: Family Medicine

## 2011-06-02 ENCOUNTER — Ambulatory Visit (HOSPITAL_COMMUNITY): Payer: BC Managed Care – PPO | Admitting: *Deleted

## 2011-06-04 ENCOUNTER — Ambulatory Visit (HOSPITAL_COMMUNITY): Payer: BC Managed Care – PPO | Admitting: Physical Therapy

## 2011-06-07 ENCOUNTER — Ambulatory Visit (HOSPITAL_COMMUNITY)
Admission: RE | Admit: 2011-06-07 | Discharge: 2011-06-07 | Disposition: A | Discharge status: Home / Self Care | Payer: BC Managed Care – PPO | Source: Ambulatory Visit | Attending: Family Medicine | Admitting: Family Medicine

## 2011-06-10 ENCOUNTER — Ambulatory Visit (HOSPITAL_COMMUNITY)
Admission: RE | Admit: 2011-06-10 | Discharge: 2011-06-10 | Disposition: A | Discharge status: Home / Self Care | Payer: BC Managed Care – PPO | Source: Ambulatory Visit | Attending: Family Medicine | Admitting: Family Medicine

## 2011-06-10 DIAGNOSIS — S93409A Sprain of unspecified ligament of unspecified ankle, initial encounter: Secondary | ICD-10-CM | POA: Insufficient documentation

## 2011-06-10 DIAGNOSIS — M25579 Pain in unspecified ankle and joints of unspecified foot: Secondary | ICD-10-CM | POA: Insufficient documentation

## 2011-06-10 NOTE — Progress Notes (Signed)
Physical Therapy Treatment Patient Name: [name] [id number] Date: 06/10/2011 Initial Eval: 05/06/11 Re-eval completed on 06/07/11, next one due 06/21/11  Time: 9:05-935 Visit: #7 Charges: 30 min TE  HPI: Symptoms/Limitations Symptoms: "I was really well after the last visit on Monday.  I felt great on Tuesday and then I went out and played golf yesterday and my ankle was all swollen so I iced it for about 90 minutes" Pain Assessment Currently in Pain?: Yes Pain Score:   5 Pain Location: Ankle Pain Orientation: Left Pain Type: Acute pain Multiple Pain Sites: No   Mobility (including Balance) Ambulation/Gait Ambulation/Gait: Yes Ambulation/Gait Assistance: 7: Independent Gait Pattern: Antalgic;Decreased stance time - left     Exercise/Treatments Bike 6 min @ 3.0 STANDING Gastroc Stretch  3x30 sec Soleus Stretch 3x30 sec. SLS BAPS Level 3 A/P, EV/IN x10 ea BAPS Level 3 Clockwise/Counterwise 5x ea Tandem Gait 5RT Toe Walks 3RT Heel walks 3 RT Rebounder 20x Red ball Squats on BOSU 2x10 SEATED  Gastroc Press 3.5PL 2x10 Goals PT Short Term Goals Short Term Goal 1: Pt will be I with HEP in order to maximize therapeutic effect Short Term Goal 1 Progress: Met Short Term Goal 2: Pt will report pain less than or equal to 2/10 for 50% of his day. Short Term Goal 2 Progress: Met Short Term Goal 3: Pt will present with decreased pain and tenderness to L ankle. Short Term Goal 3 Progress: Met PT Long Term Goals Long Term Goal 1: Pt will report pain less than or equal to 2/10 while i walking for 60 minutes in order to participate in golf activities Long Term Goal 1 Progress: Not met Long Term Goal 2: Pt will demonstrate tandem walking on uneven surface x50 ft. in order to participate safely in outdoor activities.  Long Term Goal 2 Progress: Met Long Term Goal 4: Pt will ambulate with reports of decreased edeam for improved quality of life.  Long Term Goal 4 Progress: Partly  met End of Session Patient Active Problem List  Diagnoses  . Pain in joint, ankle and foot  . Sprain of ankle, unspecified site   PT - End of Session Activity Tolerance: Patient tolerated treatment well General Behavior During Session: [facility] for tasks performed Cognition: [facility] for tasks performed PT Assessment and Plan Clinical Impression Statement: Pt was able to complete all acitivities without an increase in pain.  Pt has improved his overall static and dynamic balance and had decreased edeam today.  Still has subjective reports of edema after  strenous golf activities.  PT Plan: Cont to progress strength and add hi-volt (for edema) or IFC e-stim (for pain control) if necessary.  Pt to continue for 2 more treatments and discuss D/C.    Eldene Plocher 06/10/2011, 9:44 AM

## 2011-06-10 NOTE — Patient Instructions (Signed)
Educated patient on proper use of ice modalities

## 2011-06-16 ENCOUNTER — Ambulatory Visit (HOSPITAL_COMMUNITY)
Admission: RE | Admit: 2011-06-16 | Discharge: 2011-06-16 | Disposition: A | Discharge status: Home / Self Care | Payer: BC Managed Care – PPO | Source: Ambulatory Visit | Attending: Family Medicine | Admitting: Family Medicine

## 2011-06-16 NOTE — Progress Notes (Signed)
Physical Therapy Treatment Patient Name: [name] JYNWG'N Date: 06/16/2011  Time In: 3:30 Time Out: 4:25 Visit #: 8 Next Re-eval: 06/21/2011, next session Charge: therex 40 Retro massage: 7 min  Subjective: Symptoms/Limitations Symptoms: I am not in any pain today just sore L heel,  L ankle ankle swollen.  Received letter in mail from [employer] stating that I may possibly be taking too high a doseage, have contacted MD believe that may be partial reason for swelling.  Pain Assessment Currently in Pain?: No/denies Pain Score: 0-No pain Pain Location: Ankle Pain Orientation: Left Pain Type: Acute pain Multiple Pain Sites: No  Objective:  Exercise/Treatments Bike 6 min @ 3.0  STANDING  Gastroc Stretch 3x30 sec  Soleus Stretch 3x30 sec.  SLS  34" Rockerboard A/P, R/L 10 reps BAPS Level 3 A/P, EV/IN x10 ea  BAPS Level 3 Clockwise/Counterwise 5x ea  Tandem Gait 3RT  Toe Walks 3RT  Heel walks 3 RT  Rebounder 15x yellow ball  Squats on BOSU 2x10  SEATED  Gastroc Press 3.5PL 2x10  Additional Hip Exercises SLS:  (34") Rebounder:  (15x yellow ball L SLS) Rocker Board:  (A/P, R/L 10reps) Additional Knee Exercises Functional Squat: 20 reps;Other (comment) (on BOSU) Rocker Board:  (A/P, R/L 10reps) SLS:  (34") Rebounder:  (15x yellow ball L SLS) Ankle Exercises Ankle Dorsiflexion:  (Gastroc press 3.5 PL 2setsx 10 reps) Ankle Plantar Flexion:  (Plantar fascia st 3x 30") Additional Ankle Exercises BAPS: Level 3;10 reps;Standing (10 reps DF/PF/INV/EV, 5 reps CW, CCW) SLS:  (34") Rebounder:  (15x yellow ball L SLS) Rocker Board:  (A/P, R/L 10reps) Heel Walk (Round Trip): 3RT Toe Walk (Round Trip): 3 RT Balance Exercises Tandem Walking: 3 round trips    Goals   End of Session Patient Active Problem List  Diagnoses  . Pain in joint, ankle and foot  . Sprain of ankle, unspecified site   PT - End of Session Activity Tolerance: Patient tolerated treatment  well General Behavior During Session: [facility] for tasks performed Cognition: [facility] for tasks performed PT Assessment and Plan Clinical Impression Statement: Pt able to complete all activities with no increase in pain, minor vc required for positioning.  Pt with improved gluteal and hamstring strength and improving ankle strategy shown with rockerboard/BAPS.  Pt tender on medial soleus with prone retro massage done in plantar fascia st position. Rehab Potential: Good PT Frequency: Min 2X/week PT Plan: Re-eval next session for possible D/C to HEP.  Juel Burrow 06/16/2011, 4:55 PM

## 2011-06-17 ENCOUNTER — Inpatient Hospital Stay (HOSPITAL_COMMUNITY): Admission: RE | Admit: 2011-06-17 | Payer: BC Managed Care – PPO | Source: Ambulatory Visit

## 2011-06-22 ENCOUNTER — Ambulatory Visit (HOSPITAL_COMMUNITY)
Admission: RE | Admit: 2011-06-22 | Discharge: 2011-06-22 | Disposition: A | Discharge status: Home / Self Care | Payer: BC Managed Care – PPO | Source: Ambulatory Visit | Attending: Family Medicine | Admitting: Family Medicine

## 2011-06-22 NOTE — Progress Notes (Signed)
  Patient Name: [name] MRN: [id number] Today's Date: 06/22/2011 Time: 4098-1191 Charges: 15 min TE There-ex Bike 6 min @ 5.0 Gastroc Stretch 3x30 sec Soleous Stretch 3x30 sec Plantar fascia Stretch 3x30 sec Tandem Gait on Grass x100 ft.  Golfer lift/half kneel to stand 5x - for demonstration to decrease risk of secondary impairments.    Physical Therapy Discharge Summary  Diagnosis: L ankle pain and plantar fascitis  ICD-9 Code: 719.47, 845.09 Referring practitioner: Lestine Box Date of next MD visit: unscheduled Date of initial PT Visit: 05/06/11 Patient seen for 6 sessions  Subjective:    Patient's response to therapy: Pt reports he is able to complete a full day of work with minimal swelling. Pt is able to independently demonstrate his HEP.    Objective:   Current condition: Standing: some stiffness in the am, able to stand for hours; Walk: hours.  Pt was able to go out and golf with moderate swelling without an increase in pain to his L foot and ankle, able to tandem walk on grass surface and demonstrate half kneel to stand for proper mechanics to retrieve a golf ball without an increase in pain.  He has been able to use his HEP in order to decrease his overall pain and improve his function.  Pt is able to perform higher level balance activities without an increase in pain.       Assessment:   Summary/analysis of evaluation: Pt was referred to PT for L ankle pain and L plantar fascitis.  At his initial evaluation he had body structure impairments including increased pain, impaired balance and decreased LE flexibility which have all been resolved with outpatient PT.    Progress toward previous goals:Pt has met all STG and LTG  Plan:   Plan D/C from PT w/HEP   Mayda Shippee 06/22/2011, 12:23 PM

## 2011-12-24 DIAGNOSIS — I4949 Other premature depolarization: Secondary | ICD-10-CM | POA: Diagnosis not present

## 2011-12-24 DIAGNOSIS — I10 Essential (primary) hypertension: Secondary | ICD-10-CM | POA: Diagnosis not present

## 2011-12-24 DIAGNOSIS — E782 Mixed hyperlipidemia: Secondary | ICD-10-CM | POA: Diagnosis not present

## 2011-12-24 DIAGNOSIS — R9431 Abnormal electrocardiogram [ECG] [EKG]: Secondary | ICD-10-CM | POA: Diagnosis not present

## 2012-01-28 DIAGNOSIS — Z6830 Body mass index (BMI) 30.0-30.9, adult: Secondary | ICD-10-CM | POA: Diagnosis not present

## 2012-01-28 DIAGNOSIS — R05 Cough: Secondary | ICD-10-CM | POA: Diagnosis not present

## 2012-01-28 DIAGNOSIS — J069 Acute upper respiratory infection, unspecified: Secondary | ICD-10-CM | POA: Diagnosis not present

## 2012-03-02 DIAGNOSIS — I2510 Atherosclerotic heart disease of native coronary artery without angina pectoris: Secondary | ICD-10-CM | POA: Diagnosis not present

## 2012-03-02 DIAGNOSIS — E782 Mixed hyperlipidemia: Secondary | ICD-10-CM | POA: Diagnosis not present

## 2012-03-02 DIAGNOSIS — R9431 Abnormal electrocardiogram [ECG] [EKG]: Secondary | ICD-10-CM | POA: Diagnosis not present

## 2012-06-27 ENCOUNTER — Other Ambulatory Visit (HOSPITAL_COMMUNITY): Payer: Self-pay | Admitting: Physician Assistant

## 2012-06-27 ENCOUNTER — Ambulatory Visit (HOSPITAL_COMMUNITY)
Admission: RE | Admit: 2012-06-27 | Discharge: 2012-06-27 | Disposition: A | Discharge status: Home / Self Care | Payer: Medicare Other | Source: Ambulatory Visit | Attending: Physician Assistant | Admitting: Physician Assistant

## 2012-06-27 DIAGNOSIS — M549 Dorsalgia, unspecified: Secondary | ICD-10-CM | POA: Diagnosis not present

## 2012-06-27 DIAGNOSIS — M79609 Pain in unspecified limb: Secondary | ICD-10-CM | POA: Diagnosis not present

## 2012-06-27 DIAGNOSIS — M779 Enthesopathy, unspecified: Secondary | ICD-10-CM

## 2012-06-27 DIAGNOSIS — M7989 Other specified soft tissue disorders: Secondary | ICD-10-CM | POA: Insufficient documentation

## 2012-06-27 DIAGNOSIS — Z6829 Body mass index (BMI) 29.0-29.9, adult: Secondary | ICD-10-CM | POA: Diagnosis not present

## 2012-07-06 DIAGNOSIS — M5126 Other intervertebral disc displacement, lumbar region: Secondary | ICD-10-CM | POA: Diagnosis not present

## 2012-07-13 DIAGNOSIS — M5126 Other intervertebral disc displacement, lumbar region: Secondary | ICD-10-CM | POA: Diagnosis not present

## 2012-07-14 DIAGNOSIS — M5126 Other intervertebral disc displacement, lumbar region: Secondary | ICD-10-CM | POA: Diagnosis not present

## 2012-07-17 ENCOUNTER — Other Ambulatory Visit: Payer: Self-pay | Admitting: Specialist

## 2012-07-17 DIAGNOSIS — M5126 Other intervertebral disc displacement, lumbar region: Secondary | ICD-10-CM | POA: Diagnosis not present

## 2012-07-18 ENCOUNTER — Ambulatory Visit (HOSPITAL_COMMUNITY)
Admission: RE | Admit: 2012-07-18 | Discharge: 2012-07-18 | Disposition: A | Discharge status: Home / Self Care | Payer: Medicare Other | Source: Ambulatory Visit | Attending: Specialist | Admitting: Specialist

## 2012-07-18 ENCOUNTER — Encounter (HOSPITAL_COMMUNITY): Payer: Self-pay

## 2012-07-18 ENCOUNTER — Encounter (HOSPITAL_COMMUNITY)
Admission: RE | Admit: 2012-07-18 | Discharge: 2012-07-18 | Disposition: A | Discharge status: Home / Self Care | Payer: Medicare Other | Source: Ambulatory Visit | Attending: Specialist | Admitting: Specialist

## 2012-07-18 ENCOUNTER — Encounter (HOSPITAL_COMMUNITY): Payer: Self-pay | Admitting: Pharmacy Technician

## 2012-07-18 ENCOUNTER — Other Ambulatory Visit: Payer: Self-pay | Admitting: Specialist

## 2012-07-18 DIAGNOSIS — M51379 Other intervertebral disc degeneration, lumbosacral region without mention of lumbar back pain or lower extremity pain: Secondary | ICD-10-CM | POA: Insufficient documentation

## 2012-07-18 DIAGNOSIS — M5137 Other intervertebral disc degeneration, lumbosacral region: Secondary | ICD-10-CM | POA: Insufficient documentation

## 2012-07-18 DIAGNOSIS — Z01812 Encounter for preprocedural laboratory examination: Secondary | ICD-10-CM | POA: Diagnosis not present

## 2012-07-18 DIAGNOSIS — M48061 Spinal stenosis, lumbar region without neurogenic claudication: Secondary | ICD-10-CM | POA: Diagnosis not present

## 2012-07-18 DIAGNOSIS — Z01818 Encounter for other preprocedural examination: Secondary | ICD-10-CM | POA: Diagnosis not present

## 2012-07-18 HISTORY — DX: Unspecified transfusion reaction, initial encounter: T80.92XA

## 2012-07-18 HISTORY — DX: Acute myocardial infarction, unspecified: I21.9

## 2012-07-18 HISTORY — DX: Hypothyroidism, unspecified: E03.9

## 2012-07-18 HISTORY — DX: Unspecified asthma, uncomplicated: J45.909

## 2012-07-18 HISTORY — DX: Essential (primary) hypertension: I10

## 2012-07-18 HISTORY — DX: Gastro-esophageal reflux disease without esophagitis: K21.9

## 2012-07-18 HISTORY — DX: Hyperlipidemia, unspecified: E78.5

## 2012-07-18 LAB — COMPREHENSIVE METABOLIC PANEL
ALT: 24 U/L (ref 0–53)
AST: 19 U/L (ref 0–37)
CO2: 28 mEq/L (ref 19–32)
Chloride: 101 mEq/L (ref 96–112)
Creatinine, Ser: 1.51 mg/dL — ABNORMAL HIGH (ref 0.50–1.35)
GFR calc non Af Amer: 45 mL/min — ABNORMAL LOW (ref >90)
Glucose, Bld: 110 mg/dL — ABNORMAL HIGH (ref 70–99)
Total Bilirubin: 0.7 mg/dL (ref 0.3–1.2)

## 2012-07-18 LAB — URINALYSIS, ROUTINE W REFLEX MICROSCOPIC
Bilirubin Urine: NEGATIVE
Glucose, UA: NEGATIVE mg/dL
Ketones, ur: NEGATIVE mg/dL
pH: 5.5 (ref 5.0–8.0)

## 2012-07-18 LAB — CBC
HCT: 44.2 % (ref 39.0–52.0)
MCV: 100.9 fL — ABNORMAL HIGH (ref 78.0–100.0)
Platelets: 292 10*3/uL (ref 150–400)

## 2012-07-18 LAB — PROTIME-INR: INR: 0.91 (ref 0.00–1.49)

## 2012-07-18 NOTE — Pre-Procedure Instructions (Signed)
EKG 12-24-11 [facility] on chart stress 03-02-12 on chart last office note Dr. Alanda Amass 21-13 on chart

## 2012-07-18 NOTE — Progress Notes (Signed)
07/18/12 1059  OBSTRUCTIVE SLEEP APNEA  Have you ever been diagnosed with sleep apnea through a sleep study? No  Do you snore loudly (loud enough to be heard through closed doors)?  1  Do you often feel tired, fatigued, or sleepy during the daytime? 0  Has anyone observed you stop breathing during your sleep? 0  Do you have, or are you being treated for high blood pressure? 1  BMI more than 35 kg/m2? 0  Age over 69 years old? 1  Neck circumference greater than 40 cm/18 inches? 0  Gender: 1  Obstructive Sleep Apnea Score 4   Score 4 or greater  Updated health history;Results sent to PCP

## 2012-07-18 NOTE — Patient Instructions (Signed)
20 WYN NETTLE  07/18/2012   Your procedure is scheduled on:  07-26-2012 surgery is 1100 am to 1309 pm  Report to [facility] at  0830 AM.  Call this number if you have problems the morning of surgery: [phone]   Remember:   Do not eat food or drink liquids:After Midnight.  .  Take these medicines the morning of surgery with A SIP OF WATER: proventil nebulizer if needed   Do not wear jewelry or make up.  Do not wear lotions, powders, or perfumes.Do not wear deodorant.    Do not bring valuables to the hospital.  Contacts, dentures or bridgework may not be worn into surgery.  Leave suitcase in the car. After surgery it may be brought to your room.  For patients admitted to the hospital, checkout time is 11:00 AM the day of discharge                             Patients discharged the day of surgery will not be allowed to drive home. If going home same day of surgery, you must have someone stay with you the first 24 hours at home and arrange for some one to drive you home from hospital.    Special Instructions: CHG Shower Use Special Wash: 1/2 bottle night before surgery and 1/2 bottle morning  of surgery, use regular soap on face and front and back private area. Women do not shave legs or underarms for 2 days before showers. Men may shave face morning of surgery.    Please read over the following fact sheets that you were given: MRSA Information, bllod fact sheet, incentive spirometer fact sheet  [facility] pre op nurse phone number [phone], call if needed

## 2012-07-20 ENCOUNTER — Other Ambulatory Visit: Payer: Self-pay | Admitting: Specialist

## 2012-07-20 DIAGNOSIS — E785 Hyperlipidemia, unspecified: Secondary | ICD-10-CM | POA: Diagnosis not present

## 2012-07-20 DIAGNOSIS — E039 Hypothyroidism, unspecified: Secondary | ICD-10-CM | POA: Diagnosis not present

## 2012-07-20 DIAGNOSIS — Z0000 Encounter for general adult medical examination without abnormal findings: Secondary | ICD-10-CM | POA: Diagnosis not present

## 2012-07-20 DIAGNOSIS — Z125 Encounter for screening for malignant neoplasm of prostate: Secondary | ICD-10-CM | POA: Diagnosis not present

## 2012-07-20 DIAGNOSIS — Z6830 Body mass index (BMI) 30.0-30.9, adult: Secondary | ICD-10-CM | POA: Diagnosis not present

## 2012-07-25 MED ORDER — CEFAZOLIN SODIUM-DEXTROSE 2-3 GM-% IV SOLR
2.0000 g | INTRAVENOUS | Status: AC
  Administered 2012-07-26: 2 g via INTRAVENOUS

## 2012-07-26 ENCOUNTER — Encounter (HOSPITAL_COMMUNITY): Payer: Self-pay | Admitting: Orthopedic Surgery

## 2012-07-26 ENCOUNTER — Encounter (HOSPITAL_COMMUNITY): Payer: Self-pay | Admitting: Anesthesiology

## 2012-07-26 ENCOUNTER — Ambulatory Visit (HOSPITAL_COMMUNITY): Payer: Medicare Other

## 2012-07-26 ENCOUNTER — Encounter (HOSPITAL_COMMUNITY): Payer: Self-pay | Admitting: *Deleted

## 2012-07-26 ENCOUNTER — Ambulatory Visit (HOSPITAL_COMMUNITY): Payer: Medicare Other | Admitting: Anesthesiology

## 2012-07-26 ENCOUNTER — Encounter (HOSPITAL_COMMUNITY)
Admission: RE | Disposition: A | Discharge status: Home / Self Care | Payer: Self-pay | Source: Ambulatory Visit | Attending: Specialist

## 2012-07-26 ENCOUNTER — Observation Stay (HOSPITAL_COMMUNITY)
Admission: RE | Admit: 2012-07-26 | Discharge: 2012-07-28 | Disposition: A | Discharge status: Home / Self Care | Payer: Medicare Other | Source: Ambulatory Visit | Attending: Specialist | Admitting: Specialist

## 2012-07-26 DIAGNOSIS — J45909 Unspecified asthma, uncomplicated: Secondary | ICD-10-CM | POA: Diagnosis not present

## 2012-07-26 DIAGNOSIS — K219 Gastro-esophageal reflux disease without esophagitis: Secondary | ICD-10-CM | POA: Diagnosis not present

## 2012-07-26 DIAGNOSIS — E785 Hyperlipidemia, unspecified: Secondary | ICD-10-CM | POA: Insufficient documentation

## 2012-07-26 DIAGNOSIS — M5126 Other intervertebral disc displacement, lumbar region: Principal | ICD-10-CM | POA: Insufficient documentation

## 2012-07-26 DIAGNOSIS — E039 Hypothyroidism, unspecified: Secondary | ICD-10-CM | POA: Diagnosis not present

## 2012-07-26 DIAGNOSIS — I10 Essential (primary) hypertension: Secondary | ICD-10-CM | POA: Insufficient documentation

## 2012-07-26 DIAGNOSIS — Z79899 Other long term (current) drug therapy: Secondary | ICD-10-CM | POA: Insufficient documentation

## 2012-07-26 DIAGNOSIS — M48062 Spinal stenosis, lumbar region with neurogenic claudication: Secondary | ICD-10-CM

## 2012-07-26 DIAGNOSIS — M48061 Spinal stenosis, lumbar region without neurogenic claudication: Secondary | ICD-10-CM | POA: Diagnosis not present

## 2012-07-26 DIAGNOSIS — Z4789 Encounter for other orthopedic aftercare: Secondary | ICD-10-CM | POA: Diagnosis not present

## 2012-07-26 DIAGNOSIS — I252 Old myocardial infarction: Secondary | ICD-10-CM | POA: Diagnosis not present

## 2012-07-26 HISTORY — PX: LUMBAR LAMINECTOMY/DECOMPRESSION MICRODISCECTOMY: SHX5026

## 2012-07-26 LAB — TYPE AND SCREEN
ABO/RH(D): B POS
Antibody Screen: NEGATIVE

## 2012-07-26 LAB — ABO/RH: ABO/RH(D): B POS

## 2012-07-26 SURGERY — LUMBAR LAMINECTOMY/DECOMPRESSION MICRODISCECTOMY
Anesthesia: General | Site: Back | Wound class: Clean

## 2012-07-26 MED ORDER — SODIUM CHLORIDE 0.9 % IR SOLN
Status: DC | PRN
  Administered 2012-07-26: 11:00:00

## 2012-07-26 MED ORDER — ACETAMINOPHEN 650 MG RE SUPP: 650.0000 mg | RECTAL | Status: DC | PRN

## 2012-07-26 MED ORDER — ONDANSETRON HCL 4 MG/2ML IJ SOLN
4.0000 mg | INTRAMUSCULAR | Status: DC | PRN
  Administered 2012-07-27: 4 mg via INTRAVENOUS
  Filled 2012-07-26: qty 2

## 2012-07-26 MED ORDER — SODIUM CHLORIDE 0.9 % IJ SOLN: 3.0000 mL | Freq: Two times a day (BID) | INTRAMUSCULAR | Status: DC

## 2012-07-26 MED ORDER — PHENYLEPHRINE HCL 10 MG/ML IJ SOLN
INTRAMUSCULAR | Status: DC | PRN
  Administered 2012-07-26: 40 ug via INTRAVENOUS
  Administered 2012-07-26 (×2): 20 ug via INTRAVENOUS
  Administered 2012-07-26: 40 ug via INTRAVENOUS
  Administered 2012-07-26: 20 ug via INTRAVENOUS

## 2012-07-26 MED ORDER — NEOSTIGMINE METHYLSULFATE 1 MG/ML IJ SOLN
INTRAMUSCULAR | Status: DC | PRN
  Administered 2012-07-26: 1 mg via INTRAVENOUS

## 2012-07-26 MED ORDER — LIDOCAINE HCL (CARDIAC) 20 MG/ML IV SOLN
INTRAVENOUS | Status: DC | PRN
  Administered 2012-07-26: 100 mg via INTRAVENOUS

## 2012-07-26 MED ORDER — SODIUM CHLORIDE 0.45 % IV SOLN
INTRAVENOUS | Status: DC
  Administered 2012-07-26 (×2): via INTRAVENOUS

## 2012-07-26 MED ORDER — HYDROCODONE-ACETAMINOPHEN 5-325 MG PO TABS: 1.0000 | ORAL_TABLET | ORAL | Status: AC | PRN

## 2012-07-26 MED ORDER — ALBUTEROL SULFATE (5 MG/ML) 0.5% IN NEBU: 2.5000 mg | INHALATION_SOLUTION | Freq: Four times a day (QID) | RESPIRATORY_TRACT | Status: DC | PRN

## 2012-07-26 MED ORDER — MIDAZOLAM HCL 5 MG/5ML IJ SOLN
INTRAMUSCULAR | Status: DC | PRN
  Administered 2012-07-26: 2 mg via INTRAVENOUS

## 2012-07-26 MED ORDER — LEVOTHYROXINE SODIUM 75 MCG PO TABS
75.0000 ug | ORAL_TABLET | Freq: Every day | ORAL | Status: DC
  Administered 2012-07-26 – 2012-07-27 (×2): 75 ug via ORAL
  Filled 2012-07-26 (×3): qty 1

## 2012-07-26 MED ORDER — ONDANSETRON HCL 4 MG/2ML IJ SOLN
INTRAMUSCULAR | Status: DC | PRN
  Administered 2012-07-26 (×2): 2 mg via INTRAVENOUS

## 2012-07-26 MED ORDER — PROMETHAZINE HCL 25 MG/ML IJ SOLN
6.2500 mg | INTRAMUSCULAR | Status: DC | PRN
  Administered 2012-07-26: 6.25 mg via INTRAVENOUS

## 2012-07-26 MED ORDER — EPHEDRINE SULFATE 50 MG/ML IJ SOLN
INTRAMUSCULAR | Status: DC | PRN
  Administered 2012-07-26: 10 mg via INTRAVENOUS

## 2012-07-26 MED ORDER — RAMIPRIL 2.5 MG PO CAPS
2.5000 mg | ORAL_CAPSULE | Freq: Every day | ORAL | Status: DC
  Administered 2012-07-26 – 2012-07-27 (×2): 2.5 mg via ORAL
  Filled 2012-07-26 (×4): qty 1

## 2012-07-26 MED ORDER — PROPOFOL 10 MG/ML IV EMUL
INTRAVENOUS | Status: DC | PRN
  Administered 2012-07-26: 150 mg via INTRAVENOUS

## 2012-07-26 MED ORDER — AMLODIPINE BESYLATE 10 MG PO TABS
10.0000 mg | ORAL_TABLET | Freq: Every day | ORAL | Status: DC
  Administered 2012-07-26 – 2012-07-27 (×2): 10 mg via ORAL
  Filled 2012-07-26 (×3): qty 1

## 2012-07-26 MED ORDER — OXYCODONE-ACETAMINOPHEN 5-325 MG PO TABS: 1.0000 | ORAL_TABLET | ORAL | Status: DC | PRN

## 2012-07-26 MED ORDER — HYDROMORPHONE HCL PF 1 MG/ML IJ SOLN
0.2500 mg | INTRAMUSCULAR | Status: DC | PRN
  Administered 2012-07-26 (×2): 0.5 mg via INTRAVENOUS

## 2012-07-26 MED ORDER — GLYCOPYRROLATE 0.2 MG/ML IJ SOLN
INTRAMUSCULAR | Status: DC | PRN
  Administered 2012-07-26: 0.2 mg via INTRAVENOUS

## 2012-07-26 MED ORDER — ACETAMINOPHEN 325 MG PO TABS: 650.0000 mg | ORAL_TABLET | ORAL | Status: DC | PRN

## 2012-07-26 MED ORDER — ACETAMINOPHEN 10 MG/ML IV SOLN
INTRAVENOUS | Status: DC | PRN
  Administered 2012-07-26: 1000 mg via INTRAVENOUS

## 2012-07-26 MED ORDER — PHENOL 1.4 % MT LIQD: 1.0000 | OROMUCOSAL | Status: DC | PRN

## 2012-07-26 MED ORDER — FENTANYL CITRATE 0.05 MG/ML IJ SOLN
INTRAMUSCULAR | Status: DC | PRN
  Administered 2012-07-26: 50 ug via INTRAVENOUS
  Administered 2012-07-26 (×2): 100 ug via INTRAVENOUS
  Administered 2012-07-26: 50 ug via INTRAVENOUS

## 2012-07-26 MED ORDER — THROMBIN 5000 UNITS EX SOLR
CUTANEOUS | Status: DC | PRN
  Administered 2012-07-26: 5000 [IU] via TOPICAL

## 2012-07-26 MED ORDER — CISATRACURIUM BESYLATE (PF) 10 MG/5ML IV SOLN
INTRAVENOUS | Status: DC | PRN
  Administered 2012-07-26: 2 mg via INTRAVENOUS
  Administered 2012-07-26: 8 mg via INTRAVENOUS
  Administered 2012-07-26: 2 mg via INTRAVENOUS

## 2012-07-26 MED ORDER — METHOCARBAMOL 500 MG PO TABS: 500.0000 mg | ORAL_TABLET | Freq: Three times a day (TID) | ORAL | Status: AC | PRN

## 2012-07-26 MED ORDER — METHOCARBAMOL 100 MG/ML IJ SOLN
500.0000 mg | Freq: Four times a day (QID) | INTRAVENOUS | Status: DC | PRN
  Administered 2012-07-26: 500 mg via INTRAVENOUS
  Filled 2012-07-26: qty 5

## 2012-07-26 MED ORDER — METHOCARBAMOL 500 MG PO TABS
500.0000 mg | ORAL_TABLET | Freq: Four times a day (QID) | ORAL | Status: DC | PRN
  Administered 2012-07-26 – 2012-07-28 (×3): 500 mg via ORAL
  Filled 2012-07-26 (×4): qty 1

## 2012-07-26 MED ORDER — CEFAZOLIN SODIUM-DEXTROSE 2-3 GM-% IV SOLR
2.0000 g | Freq: Three times a day (TID) | INTRAVENOUS | Status: AC
  Administered 2012-07-26 – 2012-07-27 (×2): 2 g via INTRAVENOUS
  Filled 2012-07-26 (×2): qty 50

## 2012-07-26 MED ORDER — BUPIVACAINE-EPINEPHRINE 0.5% -1:200000 IJ SOLN
INTRAMUSCULAR | Status: DC | PRN
  Administered 2012-07-26: 15 mL

## 2012-07-26 MED ORDER — MENTHOL 3 MG MT LOZG: 1.0000 | LOZENGE | OROMUCOSAL | Status: DC | PRN

## 2012-07-26 MED ORDER — SODIUM CHLORIDE 0.9 % IJ SOLN: 3.0000 mL | INTRAMUSCULAR | Status: DC | PRN

## 2012-07-26 MED ORDER — HYDROCODONE-ACETAMINOPHEN 5-325 MG PO TABS
1.0000 | ORAL_TABLET | ORAL | Status: DC | PRN
  Administered 2012-07-26 (×2): 1 via ORAL
  Administered 2012-07-27: 2 via ORAL
  Administered 2012-07-27 (×2): 1 via ORAL
  Administered 2012-07-27: 2 via ORAL
  Administered 2012-07-28: 1 via ORAL
  Filled 2012-07-26: qty 1
  Filled 2012-07-26: qty 2
  Filled 2012-07-26 (×2): qty 1
  Filled 2012-07-26: qty 2
  Filled 2012-07-26: qty 1
  Filled 2012-07-26: qty 2

## 2012-07-26 MED ORDER — DOCUSATE SODIUM 100 MG PO CAPS
100.0000 mg | ORAL_CAPSULE | Freq: Two times a day (BID) | ORAL | Status: DC
  Administered 2012-07-26 – 2012-07-28 (×3): 100 mg via ORAL

## 2012-07-26 MED ORDER — LACTATED RINGERS IV SOLN
INTRAVENOUS | Status: DC
  Administered 2012-07-26 (×3): via INTRAVENOUS

## 2012-07-26 MED ORDER — DEXAMETHASONE SODIUM PHOSPHATE 10 MG/ML IJ SOLN
INTRAMUSCULAR | Status: DC | PRN
  Administered 2012-07-26: 10 mg via INTRAVENOUS

## 2012-07-26 MED ORDER — HYDROMORPHONE HCL PF 1 MG/ML IJ SOLN: 0.5000 mg | INTRAMUSCULAR | Status: DC | PRN

## 2012-07-26 MED ORDER — SODIUM CHLORIDE 0.9 % IV SOLN: 250.0000 mL | INTRAVENOUS | Status: DC

## 2012-07-26 SURGICAL SUPPLY — 53 items
APL SKNCLS STERI-STRIP NONHPOA (GAUZE/BANDAGES/DRESSINGS) ×1
BAG SPEC THK2 15X12 ZIP CLS (MISCELLANEOUS) ×1
BAG ZIPLOCK 12X15 (MISCELLANEOUS) ×2 IMPLANT
BENZOIN TINCTURE PRP APPL 2/3 (GAUZE/BANDAGES/DRESSINGS) ×3 IMPLANT
CHLORAPREP W/TINT 26ML (MISCELLANEOUS) IMPLANT
CLEANER TIP ELECTROSURG 2X2 (MISCELLANEOUS) ×2 IMPLANT
CLOTH BEACON ORANGE TIMEOUT ST (SAFETY) ×2 IMPLANT
DECANTER SPIKE VIAL GLASS SM (MISCELLANEOUS) ×2 IMPLANT
DRAPE LG THREE QUARTER DISP (DRAPES) ×2 IMPLANT
DRAPE MICROSCOPE LEICA (MISCELLANEOUS) ×2 IMPLANT
DRAPE POUCH INSTRU U-SHP 10X18 (DRAPES) ×2 IMPLANT
DRAPE SURG 17X11 SM STRL (DRAPES) ×2 IMPLANT
DRSG EMULSION OIL 3X3 NADH (GAUZE/BANDAGES/DRESSINGS) IMPLANT
DRSG PAD ABDOMINAL 8X10 ST (GAUZE/BANDAGES/DRESSINGS) IMPLANT
DRSG TELFA 4X5 ISLAND ADH (GAUZE/BANDAGES/DRESSINGS) ×2 IMPLANT
DURAPREP 26ML APPLICATOR (WOUND CARE) ×2 IMPLANT
ELECT BLADE TIP CTD 4 INCH (ELECTRODE) ×1 IMPLANT
ELECT REM PT RETURN 9FT ADLT (ELECTROSURGICAL) ×2
ELECTRODE REM PT RTRN 9FT ADLT (ELECTROSURGICAL) ×1 IMPLANT
GLOVE BIOGEL PI IND STRL 8 (GLOVE) ×1 IMPLANT
GLOVE BIOGEL PI INDICATOR 8 (GLOVE) ×2
GLOVE ECLIPSE 6.5 STRL STRAW (GLOVE) ×1 IMPLANT
GLOVE ECLIPSE 8.0 STRL XLNG CF (GLOVE) ×2 IMPLANT
GLOVE INDICATOR 6.5 STRL GRN (GLOVE) ×2 IMPLANT
GLOVE SURG SS PI 7.5 STRL IVOR (GLOVE) ×2 IMPLANT
GLOVE SURG SS PI 8.0 STRL IVOR (GLOVE) ×4 IMPLANT
GOWN PREVENTION PLUS LG XLONG (DISPOSABLE) ×2 IMPLANT
GOWN STRL REIN XL XLG (GOWN DISPOSABLE) ×2 IMPLANT
KIT BASIN OR (CUSTOM PROCEDURE TRAY) ×3 IMPLANT
KIT POSITIONING SURG ANDREWS (MISCELLANEOUS) ×2 IMPLANT
MANIFOLD NEPTUNE II (INSTRUMENTS) ×2 IMPLANT
NDL SPNL 18GX3.5 QUINCKE PK (NEEDLE) ×3 IMPLANT
NEEDLE SPNL 18GX3.5 QUINCKE PK (NEEDLE) ×6 IMPLANT
PATTIES SURGICAL .5 X.5 (GAUZE/BANDAGES/DRESSINGS) IMPLANT
PATTIES SURGICAL .75X.75 (GAUZE/BANDAGES/DRESSINGS) IMPLANT
PATTIES SURGICAL 1X1 (DISPOSABLE) IMPLANT
SPONGE SURGIFOAM ABS GEL 100 (HEMOSTASIS) ×2 IMPLANT
STAPLER VISISTAT (STAPLE) IMPLANT
STRIP CLOSURE SKIN 1/2X4 (GAUZE/BANDAGES/DRESSINGS) ×2 IMPLANT
SUT PROLENE 3 0 PS 2 (SUTURE) ×1 IMPLANT
SUT VIC AB 0 CT1 27 (SUTURE)
SUT VIC AB 0 CT1 27XBRD ANTBC (SUTURE) IMPLANT
SUT VIC AB 1 CT1 27 (SUTURE)
SUT VIC AB 1 CT1 27XBRD ANTBC (SUTURE) ×1 IMPLANT
SUT VIC AB 1-0 CT2 27 (SUTURE) ×1 IMPLANT
SUT VIC AB 2-0 CT1 27 (SUTURE)
SUT VIC AB 2-0 CT1 TAPERPNT 27 (SUTURE) ×1 IMPLANT
SUT VIC AB 2-0 CT2 27 (SUTURE) ×3 IMPLANT
SUT VICRYL 0 27 CT2 27 ABS (SUTURE) ×2 IMPLANT
SUT VICRYL 0 UR6 27IN ABS (SUTURE) IMPLANT
SYRINGE 10CC LL (SYRINGE) ×4 IMPLANT
TRAY LAMINECTOMY (CUSTOM PROCEDURE TRAY) ×2 IMPLANT
YANKAUER SUCT BULB TIP NO VENT (SUCTIONS) ×1 IMPLANT

## 2012-07-26 NOTE — Preoperative (Signed)
Beta Blockers   Reason not to administer Beta Blockers:Not Applicable 

## 2012-07-26 NOTE — Anesthesia Preprocedure Evaluation (Signed)
Anesthesia Evaluation  Patient identified by MRN, date of birth, ID band Patient awake    Reviewed: Allergy & Precautions, H&P , NPO status , Patient's Chart, lab work & pertinent test results  Airway Mallampati: II TM Distance: >3 FB Neck ROM: Full    Dental No notable dental hx.    Pulmonary asthma ,  breath sounds clear to auscultation  Pulmonary exam normal       Cardiovascular hypertension, Pt. on medications + Past MI Rhythm:Regular Rate:Normal     Neuro/Psych negative neurological ROS  negative psych ROS   GI/Hepatic Neg liver ROS, GERD-  ,  Endo/Other  negative endocrine ROSHypothyroidism   Renal/GU negative Renal ROS  negative genitourinary   Musculoskeletal negative musculoskeletal ROS (+)   Abdominal (+) + obese,   Peds negative pediatric ROS (+)  Hematology negative hematology ROS (+)   Anesthesia Other Findings   Reproductive/Obstetrics negative OB ROS                           Anesthesia Physical Anesthesia Plan  ASA: III  Anesthesia Plan: General   Post-op Pain Management:    Induction: Intravenous  Airway Management Planned: Oral ETT  Additional Equipment:   Intra-op Plan:   Post-operative Plan: Extubation in OR  Informed Consent: I have reviewed the patients History and Physical, chart, labs and discussed the procedure including the risks, benefits and alternatives for the proposed anesthesia with the patient or authorized representative who has indicated his/her understanding and acceptance.   Dental advisory given  Plan Discussed with: CRNA  Anesthesia Plan Comments:         Anesthesia Quick Evaluation

## 2012-07-26 NOTE — Brief Op Note (Signed)
07/26/2012  12:42 PM  PATIENT:  [name]  71 y.o. male  PRE-OPERATIVE DIAGNOSIS:  stenosis l4-5  POST-OPERATIVE DIAGNOSIS:  stenosis l4-5  PROCEDURE:  Procedure(s) (LRB) with comments: LUMBAR LAMINECTOMY/DECOMPRESSION MICRODISCECTOMY (N/A) - Lumbar Decompression L4-L5  SURGEON:  Surgeon(s) and Role:    * Javier Docker, MD - Primary    * Drucilla Schmidt, MD - Assisting  PHYSICIAN ASSISTANT:   ASSISTANTS: [place]   ANESTHESIA:   general  EBL:  Total I/O In: 1000 [I.V.:1000] Out: 100 [Blood:100]  BLOOD ADMINISTERED:none  DRAINS: none   LOCAL MEDICATIONS USED:  MARCAINE     SPECIMEN:  No Specimen  DISPOSITION OF SPECIMEN:  N/A  COUNTS:  YES  TOURNIQUET:  * No tourniquets in log *  DICTATION: .Other Dictation: Dictation Number [id number]  PLAN OF CARE: Admit for overnight observation  PATIENT DISPOSITION:  PACU - hemodynamically stable.   Delay start of Pharmacological VTE agent (>24hrs) due to surgical blood loss or risk of bleeding: yes

## 2012-07-26 NOTE — Anesthesia Postprocedure Evaluation (Signed)
  Anesthesia Post-op Note  Patient: [name]  Procedure(s) Performed: Procedure(s) (LRB): LUMBAR LAMINECTOMY/DECOMPRESSION MICRODISCECTOMY (N/A)  Patient Location: PACU  Anesthesia Type: General  Level of Consciousness: awake and alert   Airway and Oxygen Therapy: Patient Spontanous Breathing  Post-op Pain: mild  Post-op Assessment: Post-op Vital signs reviewed, Patient's Cardiovascular Status Stable, Respiratory Function Stable, Patent Airway and No signs of Nausea or vomiting  Post-op Vital Signs: stable  Complications: No apparent anesthesia complications

## 2012-07-26 NOTE — Transfer of Care (Signed)
Immediate Anesthesia Transfer of Care Note  Patient: [name]  Procedure(s) Performed: Procedure(s) (LRB) with comments: LUMBAR LAMINECTOMY/DECOMPRESSION MICRODISCECTOMY (N/A) - Lumbar Decompression L4-L5  Patient Location: PACU  Anesthesia Type: General  Level of Consciousness: awake, oriented, patient cooperative, lethargic and responds to stimulation  Airway & Oxygen Therapy: Patient Spontanous Breathing and Patient connected to face mask oxygen  Post-op Assessment: Report given to PACU RN, Post -op Vital signs reviewed and stable and Patient moving all extremities  Post vital signs: Reviewed and stable  Complications: No apparent anesthesia complications

## 2012-07-26 NOTE — H&P (Signed)
[name] is an 71 y.o. male.   Chief Complaint: left leg pain numbness weakness HPI: Severe pain due to HNP stenosis L45 refractory  Past Medical History  Diagnosis Date  . Hypertension   . Myocardial infarction     hx of abnormal ekg showed prior myocardial infarction  . Hyperlipidemia   . Asthma   . Hypothyroidism   . GERD (gastroesophageal reflux disease)     occasional  . Blood transfusion abn reaction or complication, no procedure mishap 1979    reaction due to wrong blood type given    Past Surgical History  Procedure Date  . Tonsillectomy 1962  . Appendectomy 1963  . Cholecystectomy 1979  . Stomach surgery 2003  . Hernia repair 2005    No family history on file. Social History:  reports that he has never smoked. He has never used smokeless tobacco. He reports that he drinks alcohol. He reports that he does not use illicit drugs.  Allergies:  Allergies  Allergen Reactions  . Meperidine And Related Other (See Comments)    Makes him feel like he is having a heart attack  . Sulfa Antibiotics Rash    Medications Prior to Admission  Medication Sig Dispense Refill  . albuterol (PROVENTIL) (2.5 MG/3ML) 0.083% nebulizer solution Take 2.5 mg by nebulization every 6 (six) hours as needed. Wheezing and shortness of breath      . amLODipine (NORVASC) 10 MG tablet Take 10 mg by mouth at bedtime.      Marland Kitchen ibuprofen (ADVIL,MOTRIN) 200 MG tablet Take 400 mg by mouth every 6 (six) hours as needed. Pain      . levothyroxine (SYNTHROID, LEVOTHROID) 75 MCG tablet Take 75 mcg by mouth at bedtime.      . predniSONE (DELTASONE) 5 MG tablet Take 5 mg by mouth daily.      . ramipril (ALTACE) 2.5 MG capsule Take 2.5 mg by mouth at bedtime.      . simvastatin (ZOCOR) 40 MG tablet Take 40 mg by mouth at bedtime.        No results found for this or any previous visit (from the past 48 hour(s)). No results found.  Review of Systems  Neurological: Positive for sensory change and focal  weakness.  All other systems reviewed and are negative.    Blood pressure 146/82, pulse 82, temperature 97 F (36.1 C), temperature source Oral, resp. rate 18, SpO2 99.00%. Physical Exam  Vitals reviewed. Constitutional: He is oriented to person, place, and time. He appears well-developed.  HENT:  Head: Normocephalic.  Eyes: Pupils are equal, round, and reactive to light.  Neck: Normal range of motion.  Cardiovascular: Normal rate.   Respiratory: Effort normal.  GI: Soft.  Musculoskeletal: He exhibits tenderness.       +SLR bilat. EHL 5-/5. -DVT. 1+ DP PT  Neurological: He is alert and oriented to person, place, and time. No cranial nerve deficit.  Skin: Skin is warm and dry.  Psychiatric: He has a normal mood and affect.   MRI stenosis L45 HNP L45 left.  Assessment/Plan L5 radiculopathy due to L45 stenosis and HNP left refractory. Plan decompression L45 possible L5S1 left. Risks discussed.  Sariah Henkin C 07/26/2012, 8:19 AM

## 2012-07-27 ENCOUNTER — Encounter (HOSPITAL_COMMUNITY): Payer: Self-pay | Admitting: Specialist

## 2012-07-27 LAB — BASIC METABOLIC PANEL
CO2: 26 mEq/L (ref 19–32)
Calcium: 9 mg/dL (ref 8.4–10.5)
GFR calc Af Amer: 56 mL/min — ABNORMAL LOW (ref >90)
GFR calc non Af Amer: 48 mL/min — ABNORMAL LOW (ref >90)
Sodium: 137 mEq/L (ref 135–145)

## 2012-07-27 LAB — CBC
MCV: 99.7 fL (ref 78.0–100.0)
Platelets: 262 10*3/uL (ref 150–400)
RBC: 3.72 MIL/uL — ABNORMAL LOW (ref 4.22–5.81)
RDW: 12.9 % (ref 11.5–15.5)
WBC: 13.5 10*3/uL — ABNORMAL HIGH (ref 4.0–10.5)

## 2012-07-27 MED ORDER — METHOCARBAMOL 500 MG PO TABS: 500.0000 mg | ORAL_TABLET | Freq: Four times a day (QID) | ORAL | Status: AC | PRN

## 2012-07-27 MED ORDER — HYDROCODONE-ACETAMINOPHEN 5-325 MG PO TABS: 1.0000 | ORAL_TABLET | ORAL | Status: AC | PRN

## 2012-07-27 MED FILL — Thrombin For Soln 5000 Unit: CUTANEOUS | Qty: 5000 | Status: AC

## 2012-07-27 NOTE — Progress Notes (Signed)
Physical Therapy Treatment Patient Details Name: [name] MRN: [id number] DOB: 08-14-1941 Today's Date: 07/27/2012 Time: 1150-1207 PT Time Calculation (min): 17 min  PT Assessment / Plan / Recommendation Comments on Treatment Session  Improvement in dizziness this session. Able to ambulate without holding onto IV pole as well. Pt plans to stay overnight and d/c in am. Do not feel pt will need any follow-up PT.     Follow Up Recommendations  Supervision for mobility/OOB;No PT follow up    Barriers to Discharge        Equipment Recommendations  3 in 1 bedside comode    Recommendations for Other Services OT consult  Frequency Min 5X/week   Plan Discharge plan remains appropriate    Precautions / Restrictions Precautions Precautions: Back Precaution Comments: Verbally reviewd back precautions Restrictions Weight Bearing Restrictions: No   Pertinent Vitals/Pain 4/10 back    Mobility  Transfers Transfers: Sit to Stand;Stand to Sit Sit to Stand: With upper extremity assist;With armrests;From chair/3-in-1;5: Supervision Stand to Sit: With upper extremity assist;With armrests;To chair/3-in-1;5: Supervision Details for Transfer Assistance: VCs safety, technique, hand placement.  Ambulation/Gait Ambulation/Gait Assistance: 4: Min guard Ambulation Distance (Feet): 100 Feet Assistive device: None Ambulation/Gait Assistance Details: Pt able to ambulate without assistive device/IVpole this session. Reports dizziness is not nearly as bad as this a.m. Do not feel pt will need RW. Noted slight limp, with pt favoring L LE-pt  denied pain.  Gait Pattern: Step-through pattern    Exercises     PT Diagnosis: Difficulty walking  PT Problem List: Decreased activity tolerance;Decreased mobility;Decreased knowledge of precautions;Pain PT Treatment Interventions: Gait training;Stair training;Functional mobility training;Therapeutic activities;Therapeutic exercise;Patient/family education    PT Goals Acute Rehab PT Goals PT Goal Formulation: With patient/family Time For Goal Achievement: 08/03/12 Potential to Achieve Goals: Good Pt will go Supine/Side to Sit: with modified independence PT Goal: Supine/Side to Sit - Progress: Goal set today Pt will go Sit to Stand: with modified independence PT Goal: Sit to Stand - Progress: Progressing toward goal Pt will Ambulate: >150 feet;with modified independence;with least restrictive assistive device PT Goal: Ambulate - Progress: Progressing toward goal Pt will Go Up / Down Stairs: 1-2 stairs;with supervision;with least restrictive assistive device PT Goal: Up/Down Stairs - Progress: Goal set today  Visit Information  Last PT Received On: 07/27/12 Assistance Needed: +1    Subjective Data  Subjective: "Not nearly as dizzy as before" Patient Stated Goal: Home   Cognition  Overall Cognitive Status: Appears within functional limits for tasks assessed/performed Arousal/Alertness: Awake/[name] Orientation Level: Appears intact for tasks assessed Behavior During Session: [facility] for tasks performed    Balance     End of Session PT - End of Session Equipment Utilized During Treatment: Gait belt Activity Tolerance: Patient tolerated treatment well Patient left: in chair;with call bell/phone within reach;with family/visitor present   GP Functional Assessment Tool Used: Clinical judgement Functional Limitation: Mobility: Walking and moving around Mobility: Walking and Moving Around Current Status [phone]): At least 1 percent but less than 20 percent impaired, limited or restricted Mobility: Walking and Moving Around Goal Status [phone]): 0 percent impaired, limited or restricted   [name] [facility] 07/27/2012, 12:23 PM [phone]

## 2012-07-27 NOTE — Evaluation (Signed)
Occupational Therapy Evaluation Patient Details Name: [name] MRN: [id number] DOB: 1940/12/10 Today's Date: 07/27/2012 Time: 7846-9629 OT Time Calculation (min): 25 min  OT Assessment / Plan / Recommendation Clinical Impression  Pt is a 71 yo male 71 yo male s/p decompression, discectomy L4-L5. All edcuation completed. Pt will have necessary level of A from family upon d/c.     OT Assessment  Patient does not need any further OT services    Follow Up Recommendations  No OT follow up    Barriers to Discharge      Equipment Recommendations  None recommended by OT;Other (comment) (Pt is able to easily rise from std toilet. 3:1 not needed.)    Recommendations for Other Services    Frequency       Precautions / Restrictions Precautions Precautions: Back Precaution Booklet Issued: Yes (comment) Precaution Comments: Verbally reviewd back precautions Restrictions Weight Bearing Restrictions: No   Pertinent Vitals/Pain Pt c/o mild dizziness. BP 120/75.    ADL  Grooming: Simulated;Supervision/safety Where Assessed - Grooming: Unsupported standing Upper Body Bathing: Simulated;Supervision/safety Where Assessed - Upper Body Bathing: Unsupported standing Lower Body Bathing: Simulated;Supervision/safety Where Assessed - Lower Body Bathing: Unsupported sit to stand Upper Body Dressing: Simulated;Supervision/safety Where Assessed - Upper Body Dressing: Unsupported standing Lower Body Dressing: Simulated;Supervision/safety Where Assessed - Lower Body Dressing: Unsupported sit to stand Toilet Transfer: Performed;Supervision/safety Toilet Transfer Method: Sit to Barista: Regular height toilet Toileting - Clothing Manipulation and Hygiene: Simulated;Supervision/safety Where Assessed - Toileting Clothing Manipulation and Hygiene: Sit to stand from 3-in-1 or toilet Equipment Used: Gait belt Transfers/Ambulation Related to ADLs: Pt ambulated to the bathroom  with supervison. ADL Comments: Pt educated how to complete LB ADLs by crossing ankle over opposite knee. Also informed pt where to obtain tub transfer bench although this therapist does not believe he will actually need it.    OT Diagnosis:    OT Problem List:   OT Treatment Interventions:     OT Goals    Visit Information  Last OT Received On: 07/27/12 Assistance Needed: +1    Subjective Data  Subjective: Im not as dizzy Patient Stated Goal: I want to get back to playing golf.   Prior Functioning  Vision/Perception  Home Living Lives With: Spouse Available Help at Discharge: Family Type of Home: House Home Access: Stairs to enter [employer] of Steps: 2 Entrance Stairs-Rails: None Home Layout: Two level;Able to live on main level with bedroom/bathroom;Laundry or work area in basement [employer] Shower/Tub: Network engineer: None Prior Function Level of Independence: Independent Able to Take Stairs?: Yes Driving: Yes Vocation: Retired Musician: No difficulties Dominant Hand: Right      Cognition  Overall Cognitive Status: Appears within functional limits for tasks assessed/performed Arousal/Alertness: Awake/alert Orientation Level: Appears intact for tasks assessed Behavior During Session: [facility] for tasks performed    Extremity/Trunk Assessment Right Upper Extremity Assessment RUE ROM/Strength/Tone: [facility] for tasks assessed Left Upper Extremity Assessment LUE ROM/Strength/Tone: WFL for tasks assessed Right Lower Extremity Assessment RLE ROM/Strength/Tone: [facility] for tasks assessed Left Lower Extremity Assessment LLE ROM/Strength/Tone: [facility] for tasks assessed   Mobility  Shoulder Instructions  Bed Mobility Bed Mobility: Sit to Sidelying Right Rolling Right: 6: Modified independent (Device/Increase time) Right Sidelying to Sit: HOB flat;6: Modified independent (Device/Increase time) Sit  to Sidelying Right: 6: Modified independent (Device/Increase time);HOB flat Details for Bed Mobility Assistance: Pt was able to safely complete log roll Transfers Sit to Stand: 5:  Supervision;With upper extremity assist;From toilet;From bed Stand to Sit: 5: Supervision;With upper extremity assist;To toilet;To bed;To chair/3-in-1 Details for Transfer Assistance: VCs safety, technique, hand placement.        Exercise     Balance     End of Session OT - End of Session Equipment Utilized During Treatment: Gait belt Activity Tolerance: Patient tolerated treatment well Patient left: in bed;with call bell/phone within reach  GO Functional Assessment Tool Used: Clinical Observation Functional Limitation: Self care Self Care Current Status (Z6109): At least 20 percent but less than 40 percent impaired, limited or restricted Self Care Goal Status (U0454): At least 1 percent but less than 20 percent impaired, limited or restricted Self Care Discharge Status [phone]): At least 1 percent but less than 20 percent impaired, limited or restricted   [name] A OTR/L [phone] 07/27/2012, 1:32 PM

## 2012-07-27 NOTE — Discharge Summary (Signed)
Physician Discharge Summary   Patient ID: [name] MRN: [id number] DOB/AGE: Dec 12, 1940 70 y.o.  Admit date: 07/26/2012 Discharge date: 07/27/2012  Primary Diagnosis:   stenosis l4-5  Admission Diagnoses:  Past Medical History  Diagnosis Date  . Hypertension   . Myocardial infarction     hx of abnormal ekg showed prior myocardial infarction  . Hyperlipidemia   . Asthma   . Hypothyroidism   . GERD (gastroesophageal reflux disease)     occasional  . Blood transfusion abn reaction or complication, no procedure mishap 1979    reaction due to wrong blood type given   Discharge Diagnoses:  Same Active Problems:  * No active hospital problems. *   Procedure:  Procedure(s) (LRB): LUMBAR LAMINECTOMY/DECOMPRESSION MICRODISCECTOMY (N/A)   Consults: None  HPI:  See  H&P    Laboratory Data: Hospital Outpatient Visit on 07/18/2012  Component Date Value Range Status  . WBC 07/18/2012 9.1  4.0 - 10.5 K/uL Final  . RBC 07/18/2012 4.38  4.22 - 5.81 MIL/uL Final  . Hemoglobin 07/18/2012 15.1  13.0 - 17.0 g/dL Final  . HCT 40/98/1191 44.2  39.0 - 52.0 % Final  . MCV 07/18/2012 100.9* 78.0 - 100.0 fL Final  . MCH 07/18/2012 34.5* 26.0 - 34.0 pg Final  . MCHC 07/18/2012 34.2  30.0 - 36.0 g/dL Final  . RDW 47/82/9562 13.4  11.5 - 15.5 % Final  . Platelets 07/18/2012 292  150 - 400 K/uL Final  . Sodium 07/18/2012 139  135 - 145 mEq/L Final  . Potassium 07/18/2012 4.3  3.5 - 5.1 mEq/L Final  . Chloride 07/18/2012 101  96 - 112 mEq/L Final  . CO2 07/18/2012 28  19 - 32 mEq/L Final  . Glucose, Bld 07/18/2012 110* 70 - 99 mg/dL Final  . BUN 13/06/6577 19  6 - 23 mg/dL Final  . Creatinine, Ser 07/18/2012 1.51* 0.50 - 1.35 mg/dL Final  . Calcium 46/96/2952 9.6  8.4 - 10.5 mg/dL Final  . Total Protein 07/18/2012 7.7  6.0 - 8.3 g/dL Final  . Albumin 84/13/2440 4.1  3.5 - 5.2 g/dL Final  . AST 09/18/2535 19  0 - 37 U/L Final  . ALT 07/18/2012 24  0 - 53 U/L Final  . Alkaline  Phosphatase 07/18/2012 84  39 - 117 U/L Final  . Total Bilirubin 07/18/2012 0.7  0.3 - 1.2 mg/dL Final  . GFR calc non Af Amer 07/18/2012 45* >90 mL/min Final  . GFR calc Af Amer 07/18/2012 53* >90 mL/min Final   Comment:                                 The eGFR has been calculated                          using the CKD EPI equation.                          This calculation has not been                          validated in all clinical                          situations.  eGFR's persistently                          <90 mL/min signify                          possible Chronic Kidney Disease.  Marland Kitchen Prothrombin Time 07/18/2012 12.5  11.6 - 15.2 seconds Final  . INR 07/18/2012 0.91  0.00 - 1.49 Final  . Color, Urine 07/18/2012 YELLOW  YELLOW Final  . APPearance 07/18/2012 CLEAR  CLEAR Final  . Specific Gravity, Urine 07/18/2012 1.023  1.005 - 1.030 Final  . pH 07/18/2012 5.5  5.0 - 8.0 Final  . Glucose, UA 07/18/2012 NEGATIVE  NEGATIVE mg/dL Final  . Hgb urine dipstick 07/18/2012 NEGATIVE  NEGATIVE Final  . Bilirubin Urine 07/18/2012 NEGATIVE  NEGATIVE Final  . Ketones, ur 07/18/2012 NEGATIVE  NEGATIVE mg/dL Final  . Protein, ur 40/98/1191 NEGATIVE  NEGATIVE mg/dL Final  . Urobilinogen, UA 07/18/2012 0.2  0.0 - 1.0 mg/dL Final  . Nitrite 47/82/9562 NEGATIVE  NEGATIVE Final  . Leukocytes, UA 07/18/2012 NEGATIVE  NEGATIVE Final   MICROSCOPIC NOT DONE ON URINES WITH NEGATIVE PROTEIN, BLOOD, LEUKOCYTES, NITRITE, OR GLUCOSE <1000 mg/dL.  Marland Kitchen MRSA, PCR 07/18/2012 NEGATIVE  NEGATIVE Final  . Staphylococcus aureus 07/18/2012 NEGATIVE  NEGATIVE Final   Comment:                                 The Xpert SA Assay (FDA                          approved for NASAL specimens                          in patients over 72 years of age),                          is one component of                          a comprehensive surveillance                          program.  Test  performance has                          been validated by [employer] for patients greater                          than or equal to 6 year old.                          It is not intended                          to diagnose infection nor to                          guide or monitor treatment.  Basename 07/27/12 0353  HGB 12.7*    Basename 07/27/12 0353  WBC 13.5*  RBC 3.72*  HCT 37.1*  PLT 262    Basename 07/27/12 0353  NA 137  K 4.3  CL 101  CO2 26  BUN 16  CREATININE 1.44*  GLUCOSE 149*  CALCIUM 9.0   No results found for this basename: LABPT:2,INR:2 in the last 72 hours  X-Rays:Dg Chest 2 View  07/18/2012  *RADIOLOGY REPORT*  Clinical Data: Preoperative respiratory examination for back surgery.  CHEST - 2 VIEW  Comparison: 12/05/2008 radiographs.  Findings: The heart size and mediastinal contours are normal. The lungs are clear. There is no pleural effusion or pneumothorax. No acute osseous findings are identified.  There is conventional thoracic anatomy with 12 rib-bearing vertebral bodies.  Mild thoracic spine osteophyte formation appears stable. Cholecystectomy clips are noted.  IMPRESSION: Stable examination.  No active cardiopulmonary process.   Original Report Authenticated By: Gerrianne Scale, M.D.    Dg Lumbar Spine 2-3 Views  07/18/2012  *RADIOLOGY REPORT*  Clinical Data: 71 year old male preoperative study for lumbar surgery.  LUMBAR SPINE - 2-3 VIEW  Comparison: CT abdomen and pelvis 11/29/2005.  Findings: Normal lumbar segmentation.  Vertebral height and alignment appear stable.  Disc space loss and endplate spurring and sclerosis at L5-L1.  Disc space loss and lesser endplate changes at L2-L3. Visualized sacral ala and SI joints are within normal limits.  IMPRESSION: Normal lumbar segmentation.  Chronic disc degeneration. The lumbar levels on these images were numbered in anticipation of surgery.   Original Report Authenticated By:  Harley Hallmark, M.D.    US Venous Img Lower Unilateral Left  06/27/2012  *RADIOLOGY REPORT*  Clinical Data: The left lower extremity swelling and pain involving the calf and ankle  LEFT LOWER EXTREMITY VENOUS DUPLEX ULTRASOUND  Technique:  Gray-scale sonography with graded compression, as well as color Doppler and duplex ultrasound were performed to evaluate the deep venous system of the lower extremity from the level of the common femoral vein through the popliteal and proximal calf veins. Spectral Doppler was utilized to evaluate flow at rest and with distal augmentation maneuvers.  Comparison:  None.  Findings:  Normal compressibility of the common femoral, superficial femoral, and popliteal veins is demonstrated, as well as the visualized proximal calf veins.  No filling defects to suggest DVT on grayscale or color Doppler imaging.  Doppler waveforms show normal direction of venous flow, normal respiratory phasicity and response to augmentation.  There is edema of the lateral left lower extremity at site of injury.  IMPRESSION: No evidence of lower extremity deep vein thrombosis.  Original Report Authenticated By: Genevive Bi, M.D.   Dg Spine Portable 1 View  07/26/2012  *RADIOLOGY REPORT*  Clinical Data: 71 year old male undergoing lumbar surgery.  PORTABLE SPINE - 1 VIEW  Comparison: 1126 hours the same day and earlier.  Findings: Film #3 at 1227 hours.  Portable cross-table lateral intraoperative view.  Surgical probe now projects over the L4-L5 disc space where there is some vacuum disc phenomena.  A second surgical probe projects over the L5 pedicle.  IMPRESSION: Intraoperative localization as above.   Original Report Authenticated By: Harley Hallmark, M.D.    Dg Spine Portable 1 View  07/26/2012  *RADIOLOGY REPORT*  Clinical Data: L4-5 surgery  PORTABLE SPINE - 1 VIEW  Comparison: 07/18/2012  Findings: Upper needle is at the level of the spinous process L4. Lower level is directed at the lower  edge of the  spinous process L5.  IMPRESSION: Spinous processes L4 and L5 localized.   Original Report Authenticated By: Thomasenia Sales, M.D.    Dg Spine Portable 1 View  07/26/2012  *RADIOLOGY REPORT*  Clinical Data: L4-5 surgery  PORTABLE SPINE - 1 VIEW  Comparison: 07/18/2010  Findings: Tissue spreaders are in place posterior to L5 with a probe directed at the pedicle level of L5.  There is a clamp on the spinous process of L4.  IMPRESSION: Clamp on the spinous process of L4.  Probe directed at the pedicle level of L5.   Original Report Authenticated By: Thomasenia Sales, M.D.     EKG:No orders found for this or any previous visit.   Hospital Course: Patient was admitted to [facility] and taken to the OR and underwent the above state procedure without complications.  Patient tolerated the procedure well and was later transferred to the recovery room and then to the orthopaedic floor for postoperative care.  They were given PO and IV analgesics for pain control following their surgery.  They were given 24 hours of postoperative antibiotics.   PT was consulted postop to assist with mobility and transfers.  The patient was allowed to be WBAT with therapy and was taught back precautions. Discharge planning was consulted to help with postop disposition and equipment needs.  Patient had a good night on the evening of surgery and started to get up OOB with therapy on day one. Patient was seen in rounds and was ready to go home on day one.  They were given discharge instructions and dressing directions.  They were instructed on when to follow up in the office with Dr. Shelle Iron. Also F/U medical MD for glucose and Cr.  Discharge Medications: Prior to Admission medications   Medication Sig Start Date End Date Taking? Authorizing Provider  amLODipine (NORVASC) 10 MG tablet Take 10 mg by mouth at bedtime.   Yes Historical Provider, MD  levothyroxine (SYNTHROID, LEVOTHROID) 75 MCG tablet Take 75 mcg by mouth  at bedtime.   Yes Historical Provider, MD  ramipril (ALTACE) 2.5 MG capsule Take 2.5 mg by mouth at bedtime.   Yes Historical Provider, MD  simvastatin (ZOCOR) 40 MG tablet Take 40 mg by mouth at bedtime.   Yes Historical Provider, MD  albuterol (PROVENTIL) (2.5 MG/3ML) 0.083% nebulizer solution Take 2.5 mg by nebulization every 6 (six) hours as needed. Wheezing and shortness of breath    Historical Provider, MD  HYDROcodone-acetaminophen (NORCO) 5-325 MG per tablet Take 1-2 tablets by mouth every 4 (four) hours as needed for pain. 07/26/12 08/05/12  Javier Docker, MD  HYDROcodone-acetaminophen (NORCO/VICODIN) 5-325 MG per tablet Take 1-2 tablets by mouth every 4 (four) hours as needed. 07/27/12 08/06/12  Javier Docker, MD  methocarbamol (ROBAXIN) 500 MG tablet Take 1 tablet (500 mg total) by mouth 3 (three) times daily as needed. 07/26/12 08/05/12  Javier Docker, MD  methocarbamol (ROBAXIN) 500 MG tablet Take 1 tablet (500 mg total) by mouth every 6 (six) hours as needed. 07/27/12 08/06/12  Javier Docker, MD    Diet: Regular diet Activity:WBAT Follow-up:in 10 days Disposition - Home Discharged Condition: good    Medication List  As of 07/27/2012  6:43 AM   STOP taking these medications         ibuprofen 200 MG tablet      predniSONE 5 MG tablet         TAKE these medications  albuterol (2.5 MG/3ML) 0.083% nebulizer solution   Commonly known as: PROVENTIL   Take 2.5 mg by nebulization every 6 (six) hours as needed. Wheezing and shortness of breath      amLODipine 10 MG tablet   Commonly known as: NORVASC   Take 10 mg by mouth at bedtime.      HYDROcodone-acetaminophen 5-325 MG per tablet   Commonly known as: NORCO/VICODIN   Take 1-2 tablets by mouth every 4 (four) hours as needed for pain.      HYDROcodone-acetaminophen 5-325 MG per tablet   Commonly known as: NORCO/VICODIN   Take 1-2 tablets by mouth every 4 (four) hours as needed.      levothyroxine 75 MCG tablet    Commonly known as: SYNTHROID, LEVOTHROID   Take 75 mcg by mouth at bedtime.      methocarbamol 500 MG tablet   Commonly known as: ROBAXIN   Take 1 tablet (500 mg total) by mouth 3 (three) times daily as needed.      methocarbamol 500 MG tablet   Commonly known as: ROBAXIN   Take 1 tablet (500 mg total) by mouth every 6 (six) hours as needed.      ramipril 2.5 MG capsule   Commonly known as: ALTACE   Take 2.5 mg by mouth at bedtime.      simvastatin 40 MG tablet   Commonly known as: ZOCOR   Take 40 mg by mouth at bedtime.           Follow-up Information    Follow up with Vada Swift C, MD in 10 days.   Contact information:   [facility] [address], [address] [place] [address] [phone]          Signed: Javier Docker 07/27/2012, 6:43 AM

## 2012-07-27 NOTE — Progress Notes (Signed)
Subjective: 1 Day Post-Op Procedure(s) (LRB): LUMBAR LAMINECTOMY/DECOMPRESSION MICRODISCECTOMY (N/A) Patient reports pain as 2 on 0-10 scale.    Objective: Vital signs in last 24 hours: Temp:  [97 F (36.1 C)-98.6 F (37 C)] 98.2 F (36.8 C) (09/05 0145) Pulse Rate:  [73-90] 73  (09/05 0145) Resp:  [14-20] 16  (09/05 0145) BP: (101-146)/(61-90) 101/61 mmHg (09/05 0145) SpO2:  [94 %-99 %] 95 % (09/05 0145) Weight:  [99.338 kg (219 lb)] 99.338 kg (219 lb) (09/04 1414)  Intake/Output from previous day: 09/04 0701 - 09/05 0700 In: 3256.3 [P.O.:240; I.V.:2911.3; IV Piggyback:105] Out: 2230 [Urine:2130; Blood:100] Intake/Output this shift: Total I/O In: 471.3 [I.V.:471.3] Out: 1525 [Urine:1525]   Basename 07/27/12 0353  HGB 12.7*    Basename 07/27/12 0353  WBC 13.5*  RBC 3.72*  HCT 37.1*  PLT 262    Basename 07/27/12 0353  NA 137  K 4.3  CL 101  CO2 26  BUN 16  CREATININE 1.44*  GLUCOSE 149*  CALCIUM 9.0   No results found for this basename: LABPT:2,INR:2 in the last 72 hours  Dorsiflexion/Plantar flexion intact Incision: scant drainage Improve dorsiflexion and sensation. -DVT. Assessment/Plan: 1 Day Post-Op Procedure(s) (LRB): LUMBAR LAMINECTOMY/DECOMPRESSION MICRODISCECTOMY (N/A) Discharge home with home health No home health needed. Discussed with patient. F/U medical MD for sl increase in Cr. Dsg supplies.  Zane Pellecchia C 07/27/2012, 6:35 AM

## 2012-07-27 NOTE — Evaluation (Signed)
Physical Therapy Evaluation Patient Details Name: [name] MRN: [id number] DOB: 30-Aug-1941 Today's Date: 07/27/2012 Time: 0930-0950 PT Time Calculation (min): 20 min  PT Assessment / Plan / Recommendation Clinical Impression  71 yo male s/p decompression, discectomy L4-L5. Mobilizing fairly well but limited by dizziness, nausea. Will follow-up for a 2nd session today. Ambulated fairly well without RW, but pt did rely on IV pole for support. Will assess gait again during next session.     PT Assessment  Patient needs continued PT services    Follow Up Recommendations  Supervision for mobility/OOB;No PT follow up    Barriers to Discharge        Equipment Recommendations  3 in 1 bedside comode (RW need to be determined on next session)    Recommendations for Other Services OT consult   Frequency Min 5X/week    Precautions / Restrictions Precautions Precautions: Fall;Back Precaution Comments: Verbally reviewd back precautions Restrictions Weight Bearing Restrictions: No   Pertinent Vitals/Pain 4/10 back      Mobility  Bed Mobility Bed Mobility: Rolling Right;Right Sidelying to Sit Rolling Right: 5: Supervision Right Sidelying to Sit: 4: Min assist;HOB flat Details for Bed Mobility Assistance: Assist for trunk to upright due to pt too close to EOB.  Transfers Transfers: Sit to Stand;Stand to Sit Sit to Stand: 4: Min guard;With upper extremity assist;With armrests;From bed Stand to Sit: 4: Min guard;With upper extremity assist;With armrests;To chair/3-in-1 Details for Transfer Assistance: VCs safety, technique, hand placement. Pt reports dizziness with activity that does not resolve. Ambulation/Gait Ambulation/Gait Assistance: 4: Min guard Ambulation Distance (Feet): 200 Feet Assistive device: 1 person hand held assist Ambulation/Gait Assistance Details: Pt pushed IV pole for stability. Very guarded. Continues to report dizziness (min-mod) that does not resolve.  Followed with recliner for safety.  Gait Pattern: Step-through pattern    Exercises     PT Diagnosis: Difficulty walking  PT Problem List: Decreased activity tolerance;Decreased mobility;Decreased knowledge of precautions;Pain PT Treatment Interventions: Gait training;Stair training;Functional mobility training;Therapeutic activities;Therapeutic exercise;Patient/family education   PT Goals Acute Rehab PT Goals PT Goal Formulation: With patient/family Time For Goal Achievement: 08/03/12 Potential to Achieve Goals: Good Pt will go Supine/Side to Sit: with modified independence PT Goal: Supine/Side to Sit - Progress: Goal set today Pt will go Sit to Stand: with modified independence PT Goal: Sit to Stand - Progress: Goal set today Pt will Ambulate: >150 feet;with modified independence;with least restrictive assistive device PT Goal: Ambulate - Progress: Goal set today Pt will Go Up / Down Stairs: 1-2 stairs;with supervision;with least restrictive assistive device PT Goal: Up/Down Stairs - Progress: Goal set today  Visit Information  Last PT Received On: 07/27/12 Assistance Needed: +1    Subjective Data  Subjective: "The pain is not that bad. I just feel dizzy" Patient Stated Goal: Home today or tomorrow   Prior Functioning  Home Living Lives With: Spouse Available Help at Discharge: Family Type of Home: House Home Access: Stairs to enter [employer] of Steps: 2 Entrance Stairs-Rails: None Home Layout: Two level;Laundry or work area in basement;Able to live on main level with bedroom/bathroom Bathroom Shower/Tub: Network engineer: None Prior Function Level of Independence: Independent Able to Take Stairs?: Yes Driving: Yes Communication Communication: No difficulties    Cognition  Overall Cognitive Status: Appears within functional limits for tasks assessed/performed Arousal/Alertness: Awake/alert Orientation  Level: Appears intact for tasks assessed Behavior During Session: [facility] for tasks performed    Extremity/Trunk Assessment Right  Lower Extremity Assessment RLE ROM/Strength/Tone: [facility] for tasks assessed Left Lower Extremity Assessment LLE ROM/Strength/Tone: WFL for tasks assessed   Balance    End of Session PT - End of Session Equipment Utilized During Treatment: Gait belt Activity Tolerance:  (Limited by dizziness) Patient left: in chair;with call bell/phone within reach;with family/visitor present  GP Functional Assessment Tool Used: Clinical judgement Functional Limitation: Mobility: Walking and moving around Mobility: Walking and Moving Around Current Status ([id number]): At least 1 percent but less than 20 percent impaired, limited or restricted Mobility: Walking and Moving Around Goal Status [phone]): 0 percent impaired, limited or restricted   [name] [facility] 07/27/2012, 11:17 AM [phone]

## 2012-07-27 NOTE — Op Note (Signed)
NAMEMEDHANSH, BRINKMEIER NO.:  [id number]  MEDICAL RECORD NO.:  [id number]  LOCATION:  1615                         FACILITY:  [facility]  PHYSICIAN:  Jene Every, M.D.    DATE OF BIRTH:  July 11, 1941  DATE OF PROCEDURE:  07/26/2012 DATE OF DISCHARGE:                              OPERATIVE REPORT   PREOPERATIVE DIAGNOSES:  Herniated nucleus pulposus, spinal stenosis at L4-5.  POSTOPERATIVE DIAGNOSES:  Herniated nucleus pulposus, spinal stenosis at L4-5.  PROCEDURES PERFORMED: 1. Central decompression at L4-5 with bilateral hemi-laminotomies,     foraminotomies of L4 and L5. 2. Microdiskectomy at L4-5, left.  ANESTHESIA:  General.  ASSISTANT:  Marlowe Kays, MD  HISTORY:  A 71 year old with severe left lower extremity radicular pain. Extruded fragment at 4-5 to the left migrating into the foramen associated with severe lateral recess stenosis bilaterally at 4-5, symptomatic on the left, EHL, weakness.  No tension signs.  Apparently, a small interlaminar window with retrolisthesis at 4 on 5.  We felt a central decompression would facilitate retrieval of the free fragment laterally without undue compression on the thecal sac.  Risks and benefits discussed including bleeding, infection, damage to neurovascular structures, CSF leakage, epidural fibrosis, adjacent segment disease, need for fusion in the future, DVT, PE, anesthetic complications, etc.  TECHNIQUE:  With the patient in supine position and after induction of adequate general anesthesia, 2 g Kefzol, placed prone on the [place] frame.  All bony prominences well padded.  Lumbar region was prepped and draped in usual sterile fashion.  Two 18-gauge spinal needles were utilized to localize 4-5 interspace, confirmed with x-ray.  Incision was made from the spinous process of 4 to below 5.  Subcutaneous tissue was dissected.  Electrocautery was utilized to achieve hemostasis.  A 0.25% Marcaine with  epinephrine was infiltrated in the paraspinous musculature.  Paraspinous muscle elevated from lamina of 4 and 5. McCullough retractor was placed.  Kochers confirmed the 4-5 interspace. Operating microscope was draped, brought into the surgical field.  A very small interlaminar window was noted.  Therefore, we removed a portion of the spinous processes of L4 and L5 and the interspinous ligament which was attenuated.  Hypertrophic ligamentum flavum was noted.  We detached the ligamentum flavum from the cephalad edge of L5 with a straight curette.  Hemi-laminotomies at the caudad edge of 4 were performed bilaterally.  We entered centrally first, and with neural protection, we removed ligamentum flavum meticulously bilaterally. Severe lateral recess stenosis was noted bilaterally.  We decompressed the right first to allow access to the free fragment on the left.  Neuro Patty was placed beneath the ligamentum flavum.  After decompressing the lateral recess just to the medial border of the pedicle and performing foraminotomies at 5, we had severe compression of the 5 root noted. There was a large extruded fragment migrating caudad into the foramen of 5.  This was retrieved with a micropituitary and a micro-nerve hook with multiple fragments that were retrieved.  Approximately 7 fragments were retrieved out into the foramen under the thecal sac and into the disk space.  There was no fragment within the axilla noted.  We did trace the  fragment to the disk space, performed an annulotomy, and removed disk material from the disk space with a micropituitary, further mobilized with an Epstein.  Following the decompression, we utilized bipolar cautery for the epidural venous plexus.  Following that, there was 1 cm of excursion of the 5 root medial to the pedicle without tension.  Then, a probe passed freely up the foramen of 4 and 5 beneath thecal sac and the axilla without residual disk herniation  noted.  The fragment was seen migrating caudad behind the vertebral body of 5 consistent with that seen on the MRI and confirmed by a final confirmatory radiograph. Disk space copiously irrigated with antibiotic irrigation.  Inspection revealed no evidence of CSF leakage or active bleeding.  Thrombin-soaked Gelfoam was placed in laminotomy defect.  McCullough retractor was removed.  Paraspinous muscles inspected, no evidence of active bleeding. We copiously irrigated the paraspinous musculature, repaired the dorsolumbar fascia with 1 Vicryl interrupted figure-of-eight sutures, subcu with 2-0 Vicryl simple sutures.  Skin was reapproximated with 4-0 subcuticular Prolene.  Wound reinforced with Steri-Strips.  Sterile dressing applied.  Placed supine on the hospital bed, extubated without difficulty, and transported to the recovery room in satisfactory condition.  The patient tolerated the procedure well.  No complications.  Blood loss was 20 mL.     Jene Every, M.D.     Cordelia Pen  D:  07/26/2012  T:  07/27/2012  Job:  [id number]

## 2012-07-27 NOTE — Progress Notes (Signed)
Utilization review completed.  

## 2012-07-28 NOTE — Care Management Note (Signed)
    Page 1 of 2   07/28/2012     2:45:55 PM   CARE MANAGEMENT NOTE 07/28/2012  Patient:  ARLANDO, LEISINGER   Account Number:  [id number]  Date Initiated:  07/28/2012  Documentation initiated by:  Colleen Can  Subjective/Objective Assessment:   DX SPINAL STENOSIS L4-5; LUMABAR LAMINECTOMY/DECOMPRESSION     Action/Plan:   HOME UPON DISCHARGE. NO DME OR HH NEEDS   Anticipated DC Date:  07/28/2012   Anticipated DC Plan:  HOME/SELF CARE  In-house referral  NA      DC Planning Services  NA      PAC Choice  NA   Choice offered to / List presented to:  NA   DME arranged  NA      DME agency  NA     HH arranged  NA      HH agency  NA   Status of service:  Completed, signed off Medicare Important Message given?  NA - LOS <3 / Initial given by admissions (If response is "NO", the following Medicare IM given date fields will be blank) Date Medicare IM given:   Date Additional Medicare IM given:    Discharge Disposition:  HOME/SELF CARE  Per UR Regulation:    If discussed at Long Length of Stay Meetings, dates discussed:    Comments:

## 2012-07-28 NOTE — Discharge Summary (Signed)
Physician Discharge Summary   Patient ID: CORDARIOUS ZEEK MRN: [id number] DOB/AGE: 05/20/1941 71 y.o.  Admit date: 07/26/2012 Discharge date: 07/28/2012  Primary Diagnosis:   stenosis l4-5  Admission Diagnoses:  Past Medical History  Diagnosis Date  . Hypertension   . Myocardial infarction     hx of abnormal ekg showed prior myocardial infarction  . Hyperlipidemia   . Asthma   . Hypothyroidism   . GERD (gastroesophageal reflux disease)     occasional  . Blood transfusion abn reaction or complication, no procedure mishap 1979    reaction due to wrong blood type given   Discharge Diagnoses:  Same Active Problems:  * No active hospital problems. *   Procedure:  Procedure(s) (LRB): LUMBAR LAMINECTOMY/DECOMPRESSION MICRODISCECTOMY (N/A)   Consults: None  HPI:  HNP stenosis L45    Laboratory Data: Hospital Outpatient Visit on 07/18/2012  Component Date Value Range Status  . WBC 07/18/2012 9.1  4.0 - 10.5 K/uL Final  . RBC 07/18/2012 4.38  4.22 - 5.81 MIL/uL Final  . Hemoglobin 07/18/2012 15.1  13.0 - 17.0 g/dL Final  . HCT 56/21/3086 44.2  39.0 - 52.0 % Final  . MCV 07/18/2012 100.9* 78.0 - 100.0 fL Final  . MCH 07/18/2012 34.5* 26.0 - 34.0 pg Final  . MCHC 07/18/2012 34.2  30.0 - 36.0 g/dL Final  . RDW 57/84/6962 13.4  11.5 - 15.5 % Final  . Platelets 07/18/2012 292  150 - 400 K/uL Final  . Sodium 07/18/2012 139  135 - 145 mEq/L Final  . Potassium 07/18/2012 4.3  3.5 - 5.1 mEq/L Final  . Chloride 07/18/2012 101  96 - 112 mEq/L Final  . CO2 07/18/2012 28  19 - 32 mEq/L Final  . Glucose, Bld 07/18/2012 110* 70 - 99 mg/dL Final  . BUN 95/28/4132 19  6 - 23 mg/dL Final  . Creatinine, Ser 07/18/2012 1.51* 0.50 - 1.35 mg/dL Final  . Calcium 44/11/270 9.6  8.4 - 10.5 mg/dL Final  . Total Protein 07/18/2012 7.7  6.0 - 8.3 g/dL Final  . Albumin 53/66/4403 4.1  3.5 - 5.2 g/dL Final  . AST 47/42/5956 19  0 - 37 U/L Final  . ALT 07/18/2012 24  0 - 53 U/L Final  . Alkaline  Phosphatase 07/18/2012 84  39 - 117 U/L Final  . Total Bilirubin 07/18/2012 0.7  0.3 - 1.2 mg/dL Final  . GFR calc non Af Amer 07/18/2012 45* >90 mL/min Final  . GFR calc Af Amer 07/18/2012 53* >90 mL/min Final   Comment:                                 The eGFR has been calculated                          using the CKD EPI equation.                          This calculation has not been                          validated in all clinical                          situations.  eGFR's persistently                          <90 mL/min signify                          possible Chronic Kidney Disease.  Marland Kitchen Prothrombin Time 07/18/2012 12.5  11.6 - 15.2 seconds Final  . INR 07/18/2012 0.91  0.00 - 1.49 Final  . Color, Urine 07/18/2012 YELLOW  YELLOW Final  . APPearance 07/18/2012 CLEAR  CLEAR Final  . Specific Gravity, Urine 07/18/2012 1.023  1.005 - 1.030 Final  . pH 07/18/2012 5.5  5.0 - 8.0 Final  . Glucose, UA 07/18/2012 NEGATIVE  NEGATIVE mg/dL Final  . Hgb urine dipstick 07/18/2012 NEGATIVE  NEGATIVE Final  . Bilirubin Urine 07/18/2012 NEGATIVE  NEGATIVE Final  . Ketones, ur 07/18/2012 NEGATIVE  NEGATIVE mg/dL Final  . Protein, ur 16/08/9603 NEGATIVE  NEGATIVE mg/dL Final  . Urobilinogen, UA 07/18/2012 0.2  0.0 - 1.0 mg/dL Final  . Nitrite 54/07/8118 NEGATIVE  NEGATIVE Final  . Leukocytes, UA 07/18/2012 NEGATIVE  NEGATIVE Final   MICROSCOPIC NOT DONE ON URINES WITH NEGATIVE PROTEIN, BLOOD, LEUKOCYTES, NITRITE, OR GLUCOSE <1000 mg/dL.  Marland Kitchen MRSA, PCR 07/18/2012 NEGATIVE  NEGATIVE Final  . Staphylococcus aureus 07/18/2012 NEGATIVE  NEGATIVE Final   Comment:                                 The Xpert SA Assay (FDA                          approved for NASAL specimens                          in patients over 30 years of age),                          is one component of                          a comprehensive surveillance                          program.  Test  performance has                          been validated by [employer] for patients greater                          than or equal to 52 year old.                          It is not intended                          to diagnose infection nor to                          guide or monitor treatment.  Basename 07/27/12 0353  HGB 12.7*    Basename 07/27/12 0353  WBC 13.5*  RBC 3.72*  HCT 37.1*  PLT 262    Basename 07/27/12 0353  NA 137  K 4.3  CL 101  CO2 26  BUN 16  CREATININE 1.44*  GLUCOSE 149*  CALCIUM 9.0   No results found for this basename: LABPT:2,INR:2 in the last 72 hours  X-Rays:Dg Chest 2 View  07/18/2012  *RADIOLOGY REPORT*  Clinical Data: Preoperative respiratory examination for back surgery.  CHEST - 2 VIEW  Comparison: 12/05/2008 radiographs.  Findings: The heart size and mediastinal contours are normal. The lungs are clear. There is no pleural effusion or pneumothorax. No acute osseous findings are identified.  There is conventional thoracic anatomy with 12 rib-bearing vertebral bodies.  Mild thoracic spine osteophyte formation appears stable. Cholecystectomy clips are noted.  IMPRESSION: Stable examination.  No active cardiopulmonary process.   Original Report Authenticated By: Gerrianne Scale, M.D.    Dg Lumbar Spine 2-3 Views  07/18/2012  *RADIOLOGY REPORT*  Clinical Data: 71 year old male preoperative study for lumbar surgery.  LUMBAR SPINE - 2-3 VIEW  Comparison: CT abdomen and pelvis 11/29/2005.  Findings: Normal lumbar segmentation.  Vertebral height and alignment appear stable.  Disc space loss and endplate spurring and sclerosis at L5-L1.  Disc space loss and lesser endplate changes at L2-L3. Visualized sacral ala and SI joints are within normal limits.  IMPRESSION: Normal lumbar segmentation.  Chronic disc degeneration. The lumbar levels on these images were numbered in anticipation of surgery.   Original Report Authenticated By:  Harley Hallmark, M.D.    Dg Spine Portable 1 View  07/26/2012  *RADIOLOGY REPORT*  Clinical Data: 71 year old male undergoing lumbar surgery.  PORTABLE SPINE - 1 VIEW  Comparison: 1126 hours the same day and earlier.  Findings: Film #3 at 1227 hours.  Portable cross-table lateral intraoperative view.  Surgical probe now projects over the L4-L5 disc space where there is some vacuum disc phenomena.  A second surgical probe projects over the L5 pedicle.  IMPRESSION: Intraoperative localization as above.   Original Report Authenticated By: Harley Hallmark, M.D.    Dg Spine Portable 1 View  07/26/2012  *RADIOLOGY REPORT*  Clinical Data: L4-5 surgery  PORTABLE SPINE - 1 VIEW  Comparison: 07/18/2012  Findings: Upper needle is at the level of the spinous process L4. Lower level is directed at the lower edge of the spinous process L5.  IMPRESSION: Spinous processes L4 and L5 localized.   Original Report Authenticated By: Thomasenia Sales, M.D.    Dg Spine Portable 1 View  07/26/2012  *RADIOLOGY REPORT*  Clinical Data: L4-5 surgery  PORTABLE SPINE - 1 VIEW  Comparison: 07/18/2010  Findings: Tissue spreaders are in place posterior to L5 with a probe directed at the pedicle level of L5.  There is a clamp on the spinous process of L4.  IMPRESSION: Clamp on the spinous process of L4.  Probe directed at the pedicle level of L5.   Original Report Authenticated By: Thomasenia Sales, M.D.     EKG:No orders found for this or any previous visit.   Hospital Course: Patient was admitted to [facility] and taken to the OR and underwent the above state procedure without complications.  Patient tolerated the procedure well and was later transferred to the recovery room and then to the orthopaedic floor for postoperative care.  They were given PO and IV analgesics for pain control following their surgery.  They were given 24 hours of postoperative antibiotics.   PT was consulted postop to assist with mobility and transfers.   The patient was allowed to be WBAT with therapy and was taught back precautions. Discharge planning was consulted to help with postop disposition and equipment needs.  Patient had a good night on the evening of surgery and started to get up OOB with therapy on day one. Patient was seen in rounds and was ready to go home on day two.  They were given discharge instructions and dressing directions.  They were instructed on when to follow up in the office with Dr. Shelle Iron. One episode of dizziness when up with PT. Resolved.  Discharge Medications: Prior to Admission medications   Medication Sig Start Date End Date Taking? Authorizing Provider  amLODipine (NORVASC) 10 MG tablet Take 10 mg by mouth at bedtime.   Yes Historical Provider, MD  levothyroxine (SYNTHROID, LEVOTHROID) 75 MCG tablet Take 75 mcg by mouth at bedtime.   Yes Historical Provider, MD  ramipril (ALTACE) 2.5 MG capsule Take 2.5 mg by mouth at bedtime.   Yes Historical Provider, MD  simvastatin (ZOCOR) 40 MG tablet Take 40 mg by mouth at bedtime.   Yes Historical Provider, MD  albuterol (PROVENTIL) (2.5 MG/3ML) 0.083% nebulizer solution Take 2.5 mg by nebulization every 6 (six) hours as needed. Wheezing and shortness of breath    Historical Provider, MD  HYDROcodone-acetaminophen (NORCO) 5-325 MG per tablet Take 1-2 tablets by mouth every 4 (four) hours as needed for pain. 07/26/12 08/05/12  Javier Docker, MD  HYDROcodone-acetaminophen (NORCO/VICODIN) 5-325 MG per tablet Take 1-2 tablets by mouth every 4 (four) hours as needed. 07/27/12 08/06/12  Javier Docker, MD  methocarbamol (ROBAXIN) 500 MG tablet Take 1 tablet (500 mg total) by mouth 3 (three) times daily as needed. 07/26/12 08/05/12  Javier Docker, MD  methocarbamol (ROBAXIN) 500 MG tablet Take 1 tablet (500 mg total) by mouth every 6 (six) hours as needed. 07/27/12 08/06/12  Javier Docker, MD    Diet: Regular diet Activity:WBAT  Follow-up:in 10 days Disposition - Home Discharged  Condition: good   Discharge Orders    Future Orders Please Complete By Expires   Diet - low sodium heart healthy      Call MD / Call 911      Comments:   If you experience chest pain or shortness of breath, CALL 911 and be transported to the hospital emergency room.  If you develope a fever above 101 F, pus (white drainage) or increased drainage or redness at the wound, or calf pain, call your surgeon's office.   Constipation Prevention      Comments:   Drink plenty of fluids.  Prune juice may be helpful.  You may use a stool softener, such as Colace (over the counter) 100 mg twice a day.  Use MiraLax (over the counter) for constipation as needed.   Increase activity slowly as tolerated        Medication List  As of 07/28/2012  6:58 AM   STOP taking these medications         ibuprofen 200 MG tablet      predniSONE 5 MG tablet         TAKE these medications         albuterol (2.5 MG/3ML) 0.083% nebulizer solution   Commonly known as: PROVENTIL   Take 2.5 mg by nebulization every 6 (six) hours as needed. Wheezing and shortness of breath  amLODipine 10 MG tablet   Commonly known as: NORVASC   Take 10 mg by mouth at bedtime.      HYDROcodone-acetaminophen 5-325 MG per tablet   Commonly known as: NORCO/VICODIN   Take 1-2 tablets by mouth every 4 (four) hours as needed for pain.      HYDROcodone-acetaminophen 5-325 MG per tablet   Commonly known as: NORCO/VICODIN   Take 1-2 tablets by mouth every 4 (four) hours as needed.      levothyroxine 75 MCG tablet   Commonly known as: SYNTHROID, LEVOTHROID   Take 75 mcg by mouth at bedtime.      methocarbamol 500 MG tablet   Commonly known as: ROBAXIN   Take 1 tablet (500 mg total) by mouth 3 (three) times daily as needed.      methocarbamol 500 MG tablet   Commonly known as: ROBAXIN   Take 1 tablet (500 mg total) by mouth every 6 (six) hours as needed.      ramipril 2.5 MG capsule   Commonly known as: ALTACE   Take 2.5 mg  by mouth at bedtime.      simvastatin 40 MG tablet   Commonly known as: ZOCOR   Take 40 mg by mouth at bedtime.           Follow-up Information    Follow up with Shavelle Runkel C, MD in 10 days.   Contact information:   [facility] [address], [address] [place] [address] [phone]         F/U with medical MD to monitor glucose and creatinine. Signed: Josephina Melcher C 07/28/2012, 6:58 AM

## 2012-07-28 NOTE — Progress Notes (Addendum)
Subjective: 2 Days Post-Op Procedure(s) (LRB): LUMBAR LAMINECTOMY/DECOMPRESSION MICRODISCECTOMY (N/A) Patient reports pain as 2 on 0-10 scale.    Objective: Vital signs in last 24 hours: Temp:  [97.6 F (36.4 C)-97.9 F (36.6 C)] 97.9 F (36.6 C) (09/06 0604) Pulse Rate:  [61-67] 67  (09/06 0604) Resp:  [14-16] 16  (09/06 0604) BP: (127-130)/(75) 130/75 mmHg (09/06 0604) SpO2:  [95 %-97 %] 97 % (09/06 0604)  Intake/Output from previous day: 09/05 0701 - 09/06 0700 In: 1200 [P.O.:1200] Out: 2525 [Urine:2525] Intake/Output this shift: Total I/O In: 240 [P.O.:240] Out: 1225 [Urine:1225]   Basename 07/27/12 0353  HGB 12.7*    Basename 07/27/12 0353  WBC 13.5*  RBC 3.72*  HCT 37.1*  PLT 262    Basename 07/27/12 0353  NA 137  K 4.3  CL 101  CO2 26  BUN 16  CREATININE 1.44*  GLUCOSE 149*  CALCIUM 9.0   No results found for this basename: LABPT:2,INR:2 in the last 72 hours  Neurovascular intact Sensation intact distally Dorsiflexion/Plantar flexion intact Incision: dressing C/D/I  Assessment/Plan: 2 Days Post-Op Procedure(s) (LRB): LUMBAR LAMINECTOMY/DECOMPRESSION MICRODISCECTOMY (N/A) Discharge home with home health No dizziness CP or nausea DC instructions given WBC elevated due to steroid. Cr less than preop. [name] C 07/28/2012, 6:51 AM

## 2012-07-28 NOTE — Progress Notes (Signed)
Physical Therapy Treatment Patient Details Name: [name] MRN: [id number] DOB: September 19, 1941 Today's Date: 07/28/2012 Time: 2725-3664 PT Time Calculation (min): 14 min  PT Assessment / Plan / Recommendation Comments on Treatment Session  Continuing to perform well. Reports some increased soreness today along incision site. Reviewed back precautions and car transfer technique. All education completed. Plan to d/c home today.     Follow Up Recommendations  No PT follow up    Barriers to Discharge        Equipment Recommendations  None recommended by PT    Recommendations for Other Services    Frequency Min 5X/week   Plan Discharge plan remains appropriate    Precautions / Restrictions Precautions Precautions: Back Precaution Comments: Verbally reviewed back precautions.  Restrictions Weight Bearing Restrictions: No   Pertinent Vitals/Pain 4/10 lower back    Mobility  Bed Mobility Bed Mobility: Not assessed Details for Bed Mobility Assistance: Pt sitting in recliner. Able to verbalize logroll technique Transfers Transfers: Sit to Stand;Stand to Sit Sit to Stand: 6: Modified independent (Device/Increase time) Stand to Sit: 6: Modified independent (Device/Increase time) Details for Transfer Assistance: Slow to rise, descend. Good use of hands.  Ambulation/Gait Ambulation/Gait Assistance: 5: Supervision Ambulation Distance (Feet): 175 Feet Assistive device: None Ambulation/Gait Assistance Details: slow gait speed. Decreased arm swing. No LOB. Pt still somewhat guarded.  Gait Pattern: Step-through pattern;Decreased stride length;Decreased step length - right;Decreased step length - left Stairs: Yes Stairs Assistance: 4: Min assist Stairs Assistance Details (indicate cue type and reason): Son present and provided 1 HHA.  Stair Management Technique: No rails;Forwards Number of Stairs: 2     Exercises     PT Diagnosis:    PT Problem List:   PT Treatment Interventions:      PT Goals Acute Rehab PT Goals Pt will go Sit to Stand: with modified independence PT Goal: Sit to Stand - Progress: Met Pt will Ambulate: >150 feet;with modified independence;with least restrictive assistive device PT Goal: Ambulate - Progress: Progressing toward goal Pt will Go Up / Down Stairs: 1-2 stairs;with supervision;with least restrictive assistive device PT Goal: Up/Down Stairs - Progress: Progressing toward goal  Visit Information  Last PT Received On: 07/28/12 Assistance Needed: +1    Subjective Data  Subjective: "Just really sore today" Patient Stated Goal: Home   Cognition  Overall Cognitive Status: Appears within functional limits for tasks assessed/performed Arousal/Alertness: Awake/[name] Orientation Level: Appears intact for tasks assessed Behavior During Session: [name] for tasks performed    Balance     End of Session PT - End of Session Equipment Utilized During Treatment: Gait belt Activity Tolerance: Patient tolerated treatment well Patient left: in chair;with call bell/phone within reach;with family/visitor present   GP     [name] [name] 07/28/2012, 9:48 AM [phone]

## 2012-07-28 NOTE — Progress Notes (Signed)
Pt to d/c home. AVS reviewed. Pt capable of verbalizing medications and follow-up appointments. Remains hemodynamically stable. No signs and symptoms of distress. Educated pt to return to ER in the case of SOB, dizziness, or chest pain.   

## 2012-08-15 DIAGNOSIS — M5126 Other intervertebral disc displacement, lumbar region: Secondary | ICD-10-CM | POA: Diagnosis not present

## 2012-08-21 DIAGNOSIS — M5126 Other intervertebral disc displacement, lumbar region: Secondary | ICD-10-CM | POA: Diagnosis not present

## 2012-08-25 DIAGNOSIS — M5126 Other intervertebral disc displacement, lumbar region: Secondary | ICD-10-CM | POA: Diagnosis not present

## 2012-08-28 DIAGNOSIS — M5126 Other intervertebral disc displacement, lumbar region: Secondary | ICD-10-CM | POA: Diagnosis not present

## 2012-09-04 DIAGNOSIS — M5126 Other intervertebral disc displacement, lumbar region: Secondary | ICD-10-CM | POA: Diagnosis not present

## 2012-09-11 DIAGNOSIS — Z23 Encounter for immunization: Secondary | ICD-10-CM | POA: Diagnosis not present

## 2013-01-25 ENCOUNTER — Other Ambulatory Visit (HOSPITAL_COMMUNITY): Payer: Medicare Other

## 2013-01-25 DIAGNOSIS — N429 Disorder of prostate, unspecified: Secondary | ICD-10-CM | POA: Diagnosis not present

## 2013-01-25 DIAGNOSIS — R5381 Other malaise: Secondary | ICD-10-CM | POA: Diagnosis not present

## 2013-01-25 DIAGNOSIS — E782 Mixed hyperlipidemia: Secondary | ICD-10-CM | POA: Diagnosis not present

## 2013-02-01 ENCOUNTER — Ambulatory Visit (HOSPITAL_COMMUNITY)
Admission: RE | Admit: 2013-02-01 | Discharge: 2013-02-01 | Disposition: A | Discharge status: Home / Self Care | Payer: Medicare Other | Source: Ambulatory Visit | Attending: Cardiovascular Disease | Admitting: Cardiovascular Disease

## 2013-02-01 DIAGNOSIS — R011 Cardiac murmur, unspecified: Secondary | ICD-10-CM | POA: Diagnosis not present

## 2013-02-01 DIAGNOSIS — I452 Bifascicular block: Secondary | ICD-10-CM | POA: Diagnosis not present

## 2013-02-01 NOTE — Progress Notes (Signed)
*  PRELIMINARY RESULTS* Echocardiogram 2D Echocardiogram has been performed.  Conrad Gillette 02/01/2013, 9:58 AM

## 2013-03-26 DIAGNOSIS — J45909 Unspecified asthma, uncomplicated: Secondary | ICD-10-CM | POA: Diagnosis not present

## 2013-03-26 DIAGNOSIS — Z6830 Body mass index (BMI) 30.0-30.9, adult: Secondary | ICD-10-CM | POA: Diagnosis not present

## 2013-03-26 DIAGNOSIS — J019 Acute sinusitis, unspecified: Secondary | ICD-10-CM | POA: Diagnosis not present

## 2013-03-26 DIAGNOSIS — E039 Hypothyroidism, unspecified: Secondary | ICD-10-CM | POA: Diagnosis not present

## 2013-03-26 DIAGNOSIS — E785 Hyperlipidemia, unspecified: Secondary | ICD-10-CM | POA: Diagnosis not present

## 2013-03-30 DIAGNOSIS — M5126 Other intervertebral disc displacement, lumbar region: Secondary | ICD-10-CM | POA: Diagnosis not present

## 2013-04-18 ENCOUNTER — Encounter: Payer: Self-pay | Admitting: Internal Medicine

## 2013-05-03 ENCOUNTER — Encounter: Payer: Self-pay | Admitting: Internal Medicine

## 2013-05-07 ENCOUNTER — Ambulatory Visit: Payer: Medicare Other | Admitting: Gastroenterology

## 2013-05-21 ENCOUNTER — Encounter: Payer: Self-pay | Admitting: Gastroenterology

## 2013-05-21 ENCOUNTER — Ambulatory Visit (INDEPENDENT_AMBULATORY_CARE_PROVIDER_SITE_OTHER): Payer: Medicare Other | Admitting: Gastroenterology

## 2013-05-21 ENCOUNTER — Encounter (HOSPITAL_COMMUNITY): Payer: Self-pay | Admitting: Pharmacy Technician

## 2013-05-21 VITALS — BP 128/71 | HR 69 | Temp 97.8°F | Ht 70.5 in | Wt 219.6 lb

## 2013-05-21 DIAGNOSIS — Z860100 Personal history of colon polyps, unspecified: Secondary | ICD-10-CM

## 2013-05-21 MED ORDER — PEG-KCL-NACL-NASULF-NA ASC-C 100 G PO SOLR: 1.0000 | ORAL | Status: DC

## 2013-05-21 NOTE — Progress Notes (Signed)
Cc PCP 

## 2013-05-21 NOTE — Progress Notes (Signed)
Primary Care Physician:  Kirk Ruths, MD  Primary Gastroenterologist:  Roetta Sessions, MD   Chief Complaint  Patient presents with  . Colonoscopy    HPI:  [name] is a 72 y.o. male here to schedule his surveillance colonoscopy. He has a history of colonic adenomas. His last colonoscopy was in 2009 by Dr. Jena Gauss. He had left-sided transverse diverticula, 4 mm adenomatous polyp in the mid descending colon.  No constipation, diarrhea, melena, brbpr. Rare heartburn with certain foods, Zantac. No abdominal pain. No dysphagia.   He has seen Dr. Theodoro Doing within the last few months. EKG showed RBBB. Stress test, no blockage.   Current Outpatient Prescriptions  Medication Sig Dispense Refill  . albuterol (PROVENTIL) (2.5 MG/3ML) 0.083% nebulizer solution Take 2.5 mg by nebulization every 6 (six) hours as needed. Wheezing and shortness of breath      . amLODipine (NORVASC) 10 MG tablet Take 10 mg by mouth at bedtime.      Marland Kitchen aspirin 81 MG tablet Take 81 mg by mouth daily.      Marland Kitchen levothyroxine (SYNTHROID, LEVOTHROID) 75 MCG tablet Take 75 mcg by mouth at bedtime.      . ramipril (ALTACE) 2.5 MG capsule Take 2.5 mg by mouth at bedtime.      . simvastatin (ZOCOR) 40 MG tablet Take 40 mg by mouth at bedtime.      . triamterene-hydrochlorothiazide (DYAZIDE) 37.5-25 MG per capsule Take 1 capsule by mouth every other day.       . vitamin B-12 (CYANOCOBALAMIN) 1000 MCG tablet Take 1,000 mcg by mouth daily.       No current facility-administered medications for this visit.    Allergies as of 05/21/2013 - Review Complete 05/21/2013  Allergen Reaction Noted  . Meperidine and related Other (See Comments) 07/18/2012  . Sulfa antibiotics Rash 07/18/2012    Past Medical History  Diagnosis Date  . Hypertension   . Myocardial infarction     hx of abnormal ekg showed prior myocardial infarction  . Hyperlipidemia   . Asthma   . Hypothyroidism   . GERD (gastroesophageal reflux disease)      occasional  . Blood transfusion abn reaction or complication, no procedure mishap 1979    reaction due to wrong blood type given    Past Surgical History  Procedure Laterality Date  . Tonsillectomy  1962  . Appendectomy  1963  . Cholecystectomy  1979  . Stomach surgery  2003    exploratory surgery, fatty tumors with obstruction?  . Hernia repair  2005  . Lumbar laminectomy/decompression microdiscectomy  07/26/2012    Procedure: LUMBAR LAMINECTOMY/DECOMPRESSION MICRODISCECTOMY;  Surgeon: Javier Docker, MD;  Location: [facility] ORS;  Service: Orthopedics;  Laterality: N/A;  Lumbar Decompression L4-L5  . Colonoscopy  03/28/2008    ZOX:WRUEAV rectum; left-sided transverse diverticula diminutive polyp; descending colon. Adenomas.    Family History  Problem Relation Age of Onset  . Colon cancer Neg Hx   . Thyroid disease Mother   . Heart disease Mother     History   Social History  . Marital Status: Married    Spouse Name: N/A    Number of Children: N/A  . Years of Education: N/A   Occupational History  . Not on file.   Social History Main Topics  . Smoking status: Never Smoker   . Smokeless tobacco: Never Used  . Alcohol Use: Yes     Comment: occasional  . Drug Use: No  . Sexually Active:  Other Topics Concern  . Not on file   Social History Narrative  . No narrative on file      ROS:  General: Negative for anorexia, weight loss, fever, chills, fatigue, weakness. Eyes: Negative for vision changes.  ENT: Negative for hoarseness, difficulty swallowing , nasal congestion. CV: Negative for chest pain, angina, palpitations, dyspnea on exertion, peripheral edema.  Respiratory: Negative for dyspnea at rest, dyspnea on exertion, cough, sputum, wheezing.  GI: See history of present illness. GU:  Negative for dysuria, hematuria, urinary incontinence, urinary frequency, nocturnal urination.  MS: Negative for joint pain, low back pain.  Derm: Negative for rash or itching.   Neuro: Negative for weakness, abnormal sensation, seizure, frequent headaches, memory loss, confusion.  Psych: Negative for anxiety, depression, suicidal ideation, hallucinations.  Endo: Negative for unusual weight change.  Heme: Negative for bruising or bleeding. Allergy: Negative for rash or hives.    Physical Examination:  BP 128/71  Pulse 69  Temp(Src) 97.8 F (36.6 C) (Oral)  Ht 5' 10.5" (1.791 m)  Wt 219 lb 9.6 oz (99.61 kg)  BMI 31.05 kg/m2   General: Well-nourished, well-developed in no acute distress.  Head: Normocephalic, atraumatic.   Eyes: Conjunctiva pink, no icterus. Mouth: Oropharyngeal mucosa moist and pink , no lesions erythema or exudate. Neck: Supple without thyromegaly, masses, or lymphadenopathy.  Lungs: Clear to auscultation bilaterally.  Heart: Regular rate and rhythm, no murmurs rubs or gallops.  Abdomen: Bowel sounds are normal, nontender, nondistended, no hepatosplenomegaly or masses, no abdominal bruits or    hernia , no rebound or guarding.   Rectal: not performed Extremities: No lower extremity edema. No clubbing or deformities.  Neuro: Alert and oriented x 4 , grossly normal neurologically.  Skin: Warm and dry, no rash or jaundice.   Psych: Alert and cooperative, normal mood and affect.

## 2013-05-21 NOTE — Patient Instructions (Addendum)
1. Colonoscopy as schedule. Please see separate instructions.

## 2013-05-21 NOTE — Assessment & Plan Note (Signed)
Due for 5 years surveillance colonoscopy.  I have discussed the risks, alternatives, benefits with regards to but not limited to the risk of reaction to medication, bleeding, infection, perforation and the patient is agreeable to proceed. Written consent to be obtained.

## 2013-06-07 ENCOUNTER — Encounter (HOSPITAL_COMMUNITY)
Admission: RE | Disposition: A | Discharge status: Home / Self Care | Payer: Self-pay | Source: Ambulatory Visit | Attending: Internal Medicine

## 2013-06-07 ENCOUNTER — Ambulatory Visit (HOSPITAL_COMMUNITY)
Admission: RE | Admit: 2013-06-07 | Discharge: 2013-06-07 | Disposition: A | Discharge status: Home / Self Care | Payer: Medicare Other | Source: Ambulatory Visit | Attending: Internal Medicine | Admitting: Internal Medicine

## 2013-06-07 ENCOUNTER — Encounter (HOSPITAL_COMMUNITY): Payer: Self-pay

## 2013-06-07 DIAGNOSIS — Z79899 Other long term (current) drug therapy: Secondary | ICD-10-CM | POA: Diagnosis not present

## 2013-06-07 DIAGNOSIS — Z860100 Personal history of colon polyps, unspecified: Secondary | ICD-10-CM | POA: Insufficient documentation

## 2013-06-07 DIAGNOSIS — K5730 Diverticulosis of large intestine without perforation or abscess without bleeding: Secondary | ICD-10-CM | POA: Insufficient documentation

## 2013-06-07 DIAGNOSIS — J45909 Unspecified asthma, uncomplicated: Secondary | ICD-10-CM | POA: Diagnosis not present

## 2013-06-07 DIAGNOSIS — E785 Hyperlipidemia, unspecified: Secondary | ICD-10-CM | POA: Insufficient documentation

## 2013-06-07 DIAGNOSIS — K219 Gastro-esophageal reflux disease without esophagitis: Secondary | ICD-10-CM | POA: Insufficient documentation

## 2013-06-07 DIAGNOSIS — Z1211 Encounter for screening for malignant neoplasm of colon: Secondary | ICD-10-CM | POA: Diagnosis not present

## 2013-06-07 DIAGNOSIS — Z888 Allergy status to other drugs, medicaments and biological substances status: Secondary | ICD-10-CM | POA: Diagnosis not present

## 2013-06-07 DIAGNOSIS — I4510 Unspecified right bundle-branch block: Secondary | ICD-10-CM | POA: Insufficient documentation

## 2013-06-07 DIAGNOSIS — D126 Benign neoplasm of colon, unspecified: Secondary | ICD-10-CM | POA: Insufficient documentation

## 2013-06-07 DIAGNOSIS — K621 Rectal polyp: Secondary | ICD-10-CM

## 2013-06-07 DIAGNOSIS — I10 Essential (primary) hypertension: Secondary | ICD-10-CM | POA: Diagnosis not present

## 2013-06-07 DIAGNOSIS — Z882 Allergy status to sulfonamides status: Secondary | ICD-10-CM | POA: Insufficient documentation

## 2013-06-07 DIAGNOSIS — E039 Hypothyroidism, unspecified: Secondary | ICD-10-CM | POA: Diagnosis not present

## 2013-06-07 DIAGNOSIS — K620 Anal polyp: Secondary | ICD-10-CM | POA: Diagnosis not present

## 2013-06-07 DIAGNOSIS — I252 Old myocardial infarction: Secondary | ICD-10-CM | POA: Diagnosis not present

## 2013-06-07 HISTORY — PX: COLONOSCOPY: SHX5424

## 2013-06-07 SURGERY — COLONOSCOPY
Anesthesia: Moderate Sedation

## 2013-06-07 MED ORDER — MIDAZOLAM HCL 5 MG/5ML IJ SOLN
INTRAMUSCULAR | Status: AC
  Filled 2013-06-07: qty 10

## 2013-06-07 MED ORDER — FENTANYL CITRATE 0.05 MG/ML IJ SOLN
INTRAMUSCULAR | Status: AC
  Filled 2013-06-07: qty 4

## 2013-06-07 MED ORDER — MIDAZOLAM HCL 5 MG/5ML IJ SOLN
INTRAMUSCULAR | Status: DC | PRN
  Administered 2013-06-07 (×2): 1 mg via INTRAVENOUS
  Administered 2013-06-07 (×2): 2 mg via INTRAVENOUS

## 2013-06-07 MED ORDER — ONDANSETRON HCL 4 MG/2ML IJ SOLN
INTRAMUSCULAR | Status: AC
  Filled 2013-06-07: qty 2

## 2013-06-07 MED ORDER — FENTANYL CITRATE 0.05 MG/ML IJ SOLN
INTRAMUSCULAR | Status: DC | PRN
  Administered 2013-06-07 (×2): 50 ug via INTRAVENOUS

## 2013-06-07 MED ORDER — MEPERIDINE HCL 100 MG/ML IJ SOLN
INTRAMUSCULAR | Status: AC
  Filled 2013-06-07: qty 2

## 2013-06-07 MED ORDER — SODIUM CHLORIDE 0.9 % IV SOLN
INTRAVENOUS | Status: DC
  Administered 2013-06-07: 09:00:00 via INTRAVENOUS

## 2013-06-07 MED ORDER — ONDANSETRON HCL 4 MG/2ML IJ SOLN
INTRAMUSCULAR | Status: DC | PRN
  Administered 2013-06-07: 4 mg via INTRAVENOUS

## 2013-06-07 MED ORDER — STERILE WATER FOR IRRIGATION IR SOLN
Status: DC | PRN
  Administered 2013-06-07: 10:00:00

## 2013-06-07 NOTE — Op Note (Signed)
[facility] [address] [place], [address]   COLONOSCOPY PROCEDURE REPORT  PATIENT: [name], [name]  MR#:         [id number] BIRTHDATE: [birth year] , 72  yrs. old GENDER: Male ENDOSCOPIST: R.  Roetta Sessions, MD FACP FACG REFERRED BY:  Karleen Hampshire, M.D. PROCEDURE DATE:  06/07/2013 PROCEDURE:     Colonoscopy with biopsy  INDICATIONS: history of colonic adenoma  INFORMED CONSENT:  The risks, benefits, alternatives and imponderables including but not limited to bleeding, perforation as well as the possibility of a missed lesion have been reviewed.  The potential for biopsy, lesion removal, etc. have also been discussed.  Questions have been answered.  All parties agreeable. Please see the history and physical in the medical record for more information.  MEDICATIONS: Versed 6 mg IV and fentanyl 100 mcg IV in divided doses. Zofran 4 mg IV  DESCRIPTION OF PROCEDURE:  After a digital rectal exam was performed, the EC-[id number] ([id number])  colonoscope was advanced from the anus through the rectum and colon to the area of the cecum, ileocecal valve and appendiceal orifice.  The cecum was deeply intubated.  These structures were well-seen and photographed for the record.  From the level of the cecum and ileocecal valve, the scope was slowly and cautiously withdrawn.  The mucosal surfaces were carefully surveyed utilizing scope tip deflection to facilitate fold flattening as needed.  The scope was pulled down into the rectum where a thorough examination including retroflexion was performed.    FINDINGS:  Suboptimal preparation. (1) diminutive polyp in the rectum at 12 cm from the anal verge; otherwise, the remainder of the colonic mucosa appeared normal.   (2) 4 mm polyps in the base of the cecum; (1) 4 mm polyp in the mid ascending segment. The patient has pan-colonic diverticulosis. Remainder of colonic mucosa appeared normal although relatively poor prep of the  right side made exam more difficult.  THERAPEUTIC / DIAGNOSTIC MANEUVERS PERFORMED:  The above-mentioned posture cold biopsied/removed.  COMPLICATIONS: none  CECAL WITHDRAWAL TIME:  15 minutes  IMPRESSION:  Colonic diverticulosis. Rectal and colonic polyps-removed as described above  RECOMMENDATIONS:   Followup on pathology.   _______________________________ eSigned:  R. Roetta Sessions, MD FACP [facility] 06/07/2013 10:53 AM   CC:

## 2013-06-07 NOTE — H&P (View-Only) (Signed)
Primary Care Physician:  MCGOUGH,WILLIAM M, MD  Primary Gastroenterologist:  Michael Rourk, MD   Chief Complaint  Patient presents with  . Colonoscopy    HPI:  [name] is a 72 y.o. male here to schedule his surveillance colonoscopy. He has a history of colonic adenomas. His last colonoscopy was in 2009 by Dr. Rourk. He had left-sided transverse diverticula, 4 mm adenomatous polyp in the mid descending colon.  No constipation, diarrhea, melena, brbpr. Rare heartburn with certain foods, Zantac. No abdominal pain. No dysphagia.   He has seen Dr. Weintrub within the last few months. EKG showed RBBB. Stress test, no blockage.   Current Outpatient Prescriptions  Medication Sig Dispense Refill  . albuterol (PROVENTIL) (2.5 MG/3ML) 0.083% nebulizer solution Take 2.5 mg by nebulization every 6 (six) hours as needed. Wheezing and shortness of breath      . amLODipine (NORVASC) 10 MG tablet Take 10 mg by mouth at bedtime.      . aspirin 81 MG tablet Take 81 mg by mouth daily.      . levothyroxine (SYNTHROID, LEVOTHROID) 75 MCG tablet Take 75 mcg by mouth at bedtime.      . ramipril (ALTACE) 2.5 MG capsule Take 2.5 mg by mouth at bedtime.      . simvastatin (ZOCOR) 40 MG tablet Take 40 mg by mouth at bedtime.      . triamterene-hydrochlorothiazide (DYAZIDE) 37.5-25 MG per capsule Take 1 capsule by mouth every other day.       . vitamin B-12 (CYANOCOBALAMIN) 1000 MCG tablet Take 1,000 mcg by mouth daily.       No current facility-administered medications for this visit.    Allergies as of 05/21/2013 - Review Complete 05/21/2013  Allergen Reaction Noted  . Meperidine and related Other (See Comments) 07/18/2012  . Sulfa antibiotics Rash 07/18/2012    Past Medical History  Diagnosis Date  . Hypertension   . Myocardial infarction     hx of abnormal ekg showed prior myocardial infarction  . Hyperlipidemia   . Asthma   . Hypothyroidism   . GERD (gastroesophageal reflux disease)      occasional  . Blood transfusion abn reaction or complication, no procedure mishap 1979    reaction due to wrong blood type given    Past Surgical History  Procedure Laterality Date  . Tonsillectomy  1962  . Appendectomy  1963  . Cholecystectomy  1979  . Stomach surgery  2003    exploratory surgery, fatty tumors with obstruction?  . Hernia repair  2005  . Lumbar laminectomy/decompression microdiscectomy  07/26/2012    Procedure: LUMBAR LAMINECTOMY/DECOMPRESSION MICRODISCECTOMY;  Surgeon: Jeffrey C Beane, MD;  Location: [facility];  Service: Orthopedics;  Laterality: N/A;  Lumbar Decompression L4-L5  . Colonoscopy  03/28/2008    RMR:Normal rectum; left-sided transverse diverticula diminutive polyp; descending colon. Adenomas.    Family History  Problem Relation Age of Onset  . Colon cancer Neg Hx   . Thyroid disease Mother   . Heart disease Mother     History   Social History  . Marital Status: Married    Spouse Name: N/A    Number of Children: N/A  . Years of Education: N/A   Occupational History  . Not on file.   Social History Main Topics  . Smoking status: Never Smoker   . Smokeless tobacco: Never Used  . Alcohol Use: Yes     Comment: occasional  . Drug Use: No  . Sexually Active:      Other Topics Concern  . Not on file   Social History Narrative  . No narrative on file      ROS:  General: Negative for anorexia, weight loss, fever, chills, fatigue, weakness. Eyes: Negative for vision changes.  ENT: Negative for hoarseness, difficulty swallowing , nasal congestion. CV: Negative for chest pain, angina, palpitations, dyspnea on exertion, peripheral edema.  Respiratory: Negative for dyspnea at rest, dyspnea on exertion, cough, sputum, wheezing.  GI: See history of present illness. GU:  Negative for dysuria, hematuria, urinary incontinence, urinary frequency, nocturnal urination.  MS: Negative for joint pain, low back pain.  Derm: Negative for rash or itching.   Neuro: Negative for weakness, abnormal sensation, seizure, frequent headaches, memory loss, confusion.  Psych: Negative for anxiety, depression, suicidal ideation, hallucinations.  Endo: Negative for unusual weight change.  Heme: Negative for bruising or bleeding. Allergy: Negative for rash or hives.    Physical Examination:  BP 128/71  Pulse 69  Temp(Src) 97.8 F (36.6 C) (Oral)  Ht 5' 10.5" (1.791 m)  Wt 219 lb 9.6 oz (99.61 kg)  BMI 31.05 kg/m2   General: Well-nourished, well-developed in no acute distress.  Head: Normocephalic, atraumatic.   Eyes: Conjunctiva pink, no icterus. Mouth: Oropharyngeal mucosa moist and pink , no lesions erythema or exudate. Neck: Supple without thyromegaly, masses, or lymphadenopathy.  Lungs: Clear to auscultation bilaterally.  Heart: Regular rate and rhythm, no murmurs rubs or gallops.  Abdomen: Bowel sounds are normal, nontender, nondistended, no hepatosplenomegaly or masses, no abdominal bruits or    hernia , no rebound or guarding.   Rectal: not performed Extremities: No lower extremity edema. No clubbing or deformities.  Neuro: Alert and oriented x 4 , grossly normal neurologically.  Skin: Warm and dry, no rash or jaundice.   Psych: Alert and cooperative, normal mood and affect.    

## 2013-06-07 NOTE — Interval H&P Note (Signed)
History and Physical Interval Note:  06/07/2013 10:02 AM  [name]  has presented today for surgery, with the diagnosis of Adenomatous Colon Polyps  The various methods of treatment have been discussed with the patient and family. After consideration of risks, benefits and other options for treatment, the patient has consented to  Procedure(s) with comments: COLONOSCOPY (N/A) - 9:45 as a surgical intervention .  The patient's history has been reviewed, patient examined, no change in status, stable for surgery.  I have reviewed the patient's chart and labs.  Questions were answered to the patient's satisfaction.     Colonoscopy. History of colonic polyps.The risks, benefits, limitations, alternatives and imponderables have been reviewed with the patient. Questions have been answered. All parties are agreeable.   [name]

## 2013-06-08 ENCOUNTER — Encounter (HOSPITAL_COMMUNITY): Payer: Self-pay | Admitting: Internal Medicine

## 2013-06-08 ENCOUNTER — Encounter: Payer: Self-pay | Admitting: Internal Medicine

## 2013-06-29 ENCOUNTER — Other Ambulatory Visit: Payer: Self-pay | Admitting: Cardiovascular Disease

## 2013-06-29 DIAGNOSIS — E782 Mixed hyperlipidemia: Secondary | ICD-10-CM | POA: Diagnosis not present

## 2013-06-29 DIAGNOSIS — I10 Essential (primary) hypertension: Secondary | ICD-10-CM | POA: Diagnosis not present

## 2013-06-29 DIAGNOSIS — R6889 Other general symptoms and signs: Secondary | ICD-10-CM | POA: Diagnosis not present

## 2013-06-30 LAB — COMPREHENSIVE METABOLIC PANEL
Albumin: 4.6 g/dL (ref 3.5–5.2)
Alkaline Phosphatase: 78 U/L (ref 39–117)
BUN: 16 mg/dL (ref 6–23)
CO2: 31 mEq/L (ref 19–32)
Calcium: 9.9 mg/dL (ref 8.4–10.5)
Chloride: 101 mEq/L (ref 96–112)
Glucose, Bld: 86 mg/dL (ref 70–99)
Potassium: 4.8 mEq/L (ref 3.5–5.3)
Sodium: 138 mEq/L (ref 135–145)
Total Protein: 7.3 g/dL (ref 6.0–8.3)

## 2013-07-04 ENCOUNTER — Encounter: Payer: Self-pay | Admitting: Cardiovascular Disease

## 2013-08-22 DIAGNOSIS — Z23 Encounter for immunization: Secondary | ICD-10-CM | POA: Diagnosis not present

## 2013-09-07 DIAGNOSIS — D3130 Benign neoplasm of unspecified choroid: Secondary | ICD-10-CM | POA: Diagnosis not present

## 2013-10-10 ENCOUNTER — Other Ambulatory Visit: Payer: Self-pay | Admitting: *Deleted

## 2013-10-10 MED ORDER — AMLODIPINE BESYLATE 5 MG PO TABS: 5.0000 mg | ORAL_TABLET | Freq: Every day | ORAL | Status: DC

## 2013-10-10 NOTE — Telephone Encounter (Signed)
Rx was sent to pharmacy electronically. 

## 2013-10-11 ENCOUNTER — Other Ambulatory Visit: Payer: Self-pay | Admitting: *Deleted

## 2013-11-23 NOTE — Progress Notes (Signed)
REVIEWED.  

## 2014-02-05 ENCOUNTER — Ambulatory Visit (INDEPENDENT_AMBULATORY_CARE_PROVIDER_SITE_OTHER): Payer: Medicare Other | Admitting: Urology

## 2014-02-05 DIAGNOSIS — N529 Male erectile dysfunction, unspecified: Secondary | ICD-10-CM

## 2014-02-06 ENCOUNTER — Other Ambulatory Visit: Payer: Self-pay

## 2014-02-06 MED ORDER — TRIAMTERENE-HCTZ 37.5-25 MG PO CAPS: 1.0000 | ORAL_CAPSULE | ORAL | Status: DC

## 2014-02-06 NOTE — Telephone Encounter (Signed)
Rx was sent to pharmacy electronically. 

## 2014-04-04 ENCOUNTER — Other Ambulatory Visit: Payer: Self-pay | Admitting: Internal Medicine

## 2014-04-08 NOTE — Telephone Encounter (Signed)
Rx was sent to pharmacy electronically. 

## 2014-04-10 ENCOUNTER — Other Ambulatory Visit: Payer: Self-pay | Admitting: Internal Medicine

## 2014-04-11 ENCOUNTER — Other Ambulatory Visit: Payer: Self-pay | Admitting: *Deleted

## 2014-04-11 MED ORDER — TRIAMTERENE-HCTZ 37.5-25 MG PO CAPS: 1.0000 | ORAL_CAPSULE | ORAL | Status: DC

## 2014-04-11 NOTE — Telephone Encounter (Signed)
Pt. Med reordered

## 2014-07-25 DIAGNOSIS — R7309 Other abnormal glucose: Secondary | ICD-10-CM | POA: Diagnosis not present

## 2014-07-25 DIAGNOSIS — G609 Hereditary and idiopathic neuropathy, unspecified: Secondary | ICD-10-CM | POA: Diagnosis not present

## 2014-07-30 DIAGNOSIS — Z6829 Body mass index (BMI) 29.0-29.9, adult: Secondary | ICD-10-CM | POA: Diagnosis not present

## 2014-07-30 DIAGNOSIS — J301 Allergic rhinitis due to pollen: Secondary | ICD-10-CM | POA: Diagnosis not present

## 2014-08-02 DIAGNOSIS — J0100 Acute maxillary sinusitis, unspecified: Secondary | ICD-10-CM | POA: Diagnosis not present

## 2014-08-02 DIAGNOSIS — J392 Other diseases of pharynx: Secondary | ICD-10-CM | POA: Diagnosis not present

## 2014-08-20 DIAGNOSIS — R809 Proteinuria, unspecified: Secondary | ICD-10-CM | POA: Diagnosis not present

## 2014-08-20 DIAGNOSIS — N183 Chronic kidney disease, stage 3 unspecified: Secondary | ICD-10-CM | POA: Diagnosis not present

## 2014-08-20 DIAGNOSIS — Z23 Encounter for immunization: Secondary | ICD-10-CM | POA: Diagnosis not present

## 2014-08-20 DIAGNOSIS — I10 Essential (primary) hypertension: Secondary | ICD-10-CM | POA: Diagnosis not present

## 2014-08-21 ENCOUNTER — Other Ambulatory Visit (HOSPITAL_COMMUNITY): Payer: Self-pay | Admitting: Nephrology

## 2014-08-21 DIAGNOSIS — N183 Chronic kidney disease, stage 3 unspecified: Secondary | ICD-10-CM

## 2014-08-28 DIAGNOSIS — Z0000 Encounter for general adult medical examination without abnormal findings: Secondary | ICD-10-CM | POA: Diagnosis not present

## 2014-08-28 DIAGNOSIS — D492 Neoplasm of unspecified behavior of bone, soft tissue, and skin: Secondary | ICD-10-CM | POA: Diagnosis not present

## 2014-08-29 ENCOUNTER — Ambulatory Visit (HOSPITAL_COMMUNITY)
Admission: RE | Admit: 2014-08-29 | Discharge: 2014-08-29 | Disposition: A | Discharge status: Home / Self Care | Payer: Medicare Other | Source: Ambulatory Visit | Attending: Nephrology | Admitting: Nephrology

## 2014-08-29 DIAGNOSIS — Z79899 Other long term (current) drug therapy: Secondary | ICD-10-CM | POA: Diagnosis not present

## 2014-08-29 DIAGNOSIS — Z125 Encounter for screening for malignant neoplasm of prostate: Secondary | ICD-10-CM | POA: Diagnosis not present

## 2014-08-29 DIAGNOSIS — E785 Hyperlipidemia, unspecified: Secondary | ICD-10-CM | POA: Diagnosis not present

## 2014-08-29 DIAGNOSIS — E663 Overweight: Secondary | ICD-10-CM | POA: Diagnosis not present

## 2014-08-29 DIAGNOSIS — N183 Chronic kidney disease, stage 3 unspecified: Secondary | ICD-10-CM

## 2014-08-29 DIAGNOSIS — I10 Essential (primary) hypertension: Secondary | ICD-10-CM | POA: Diagnosis not present

## 2014-08-29 DIAGNOSIS — Z6829 Body mass index (BMI) 29.0-29.9, adult: Secondary | ICD-10-CM | POA: Diagnosis not present

## 2014-08-29 DIAGNOSIS — D649 Anemia, unspecified: Secondary | ICD-10-CM | POA: Diagnosis not present

## 2014-08-29 DIAGNOSIS — E1129 Type 2 diabetes mellitus with other diabetic kidney complication: Secondary | ICD-10-CM | POA: Diagnosis not present

## 2014-08-29 DIAGNOSIS — I129 Hypertensive chronic kidney disease with stage 1 through stage 4 chronic kidney disease, or unspecified chronic kidney disease: Secondary | ICD-10-CM | POA: Diagnosis not present

## 2014-08-29 DIAGNOSIS — I252 Old myocardial infarction: Secondary | ICD-10-CM | POA: Diagnosis not present

## 2014-08-29 DIAGNOSIS — Z0000 Encounter for general adult medical examination without abnormal findings: Secondary | ICD-10-CM | POA: Diagnosis not present

## 2014-08-29 DIAGNOSIS — R809 Proteinuria, unspecified: Secondary | ICD-10-CM | POA: Diagnosis not present

## 2014-08-29 DIAGNOSIS — E039 Hypothyroidism, unspecified: Secondary | ICD-10-CM | POA: Diagnosis not present

## 2014-09-02 DIAGNOSIS — L989 Disorder of the skin and subcutaneous tissue, unspecified: Secondary | ICD-10-CM | POA: Diagnosis not present

## 2014-09-04 DIAGNOSIS — L570 Actinic keratosis: Secondary | ICD-10-CM | POA: Diagnosis not present

## 2014-09-04 DIAGNOSIS — D225 Melanocytic nevi of trunk: Secondary | ICD-10-CM | POA: Diagnosis not present

## 2014-09-04 DIAGNOSIS — D224 Melanocytic nevi of scalp and neck: Secondary | ICD-10-CM | POA: Diagnosis not present

## 2014-09-04 DIAGNOSIS — X32XXXA Exposure to sunlight, initial encounter: Secondary | ICD-10-CM | POA: Diagnosis not present

## 2014-09-07 ENCOUNTER — Emergency Department (HOSPITAL_COMMUNITY): Payer: Medicare Other

## 2014-09-07 ENCOUNTER — Encounter (HOSPITAL_COMMUNITY): Payer: Self-pay | Admitting: Emergency Medicine

## 2014-09-07 ENCOUNTER — Emergency Department (HOSPITAL_COMMUNITY)
Admission: EM | Admit: 2014-09-07 | Discharge: 2014-09-07 | Disposition: A | Discharge status: Home / Self Care | Payer: Medicare Other | Attending: Emergency Medicine | Admitting: Emergency Medicine

## 2014-09-07 DIAGNOSIS — R110 Nausea: Secondary | ICD-10-CM | POA: Diagnosis not present

## 2014-09-07 DIAGNOSIS — I10 Essential (primary) hypertension: Secondary | ICD-10-CM | POA: Diagnosis not present

## 2014-09-07 DIAGNOSIS — E785 Hyperlipidemia, unspecified: Secondary | ICD-10-CM | POA: Insufficient documentation

## 2014-09-07 DIAGNOSIS — G4489 Other headache syndrome: Secondary | ICD-10-CM | POA: Diagnosis not present

## 2014-09-07 DIAGNOSIS — R51 Headache: Secondary | ICD-10-CM | POA: Diagnosis not present

## 2014-09-07 DIAGNOSIS — R112 Nausea with vomiting, unspecified: Secondary | ICD-10-CM | POA: Diagnosis not present

## 2014-09-07 DIAGNOSIS — I252 Old myocardial infarction: Secondary | ICD-10-CM | POA: Insufficient documentation

## 2014-09-07 DIAGNOSIS — Z7982 Long term (current) use of aspirin: Secondary | ICD-10-CM | POA: Insufficient documentation

## 2014-09-07 DIAGNOSIS — Z87448 Personal history of other diseases of urinary system: Secondary | ICD-10-CM | POA: Insufficient documentation

## 2014-09-07 DIAGNOSIS — E039 Hypothyroidism, unspecified: Secondary | ICD-10-CM | POA: Insufficient documentation

## 2014-09-07 DIAGNOSIS — Z79899 Other long term (current) drug therapy: Secondary | ICD-10-CM | POA: Diagnosis not present

## 2014-09-07 DIAGNOSIS — Z8719 Personal history of other diseases of the digestive system: Secondary | ICD-10-CM | POA: Insufficient documentation

## 2014-09-07 DIAGNOSIS — R197 Diarrhea, unspecified: Secondary | ICD-10-CM | POA: Diagnosis not present

## 2014-09-07 DIAGNOSIS — R42 Dizziness and giddiness: Secondary | ICD-10-CM | POA: Diagnosis not present

## 2014-09-07 DIAGNOSIS — R1110 Vomiting, unspecified: Secondary | ICD-10-CM

## 2014-09-07 DIAGNOSIS — J45909 Unspecified asthma, uncomplicated: Secondary | ICD-10-CM | POA: Diagnosis not present

## 2014-09-07 HISTORY — DX: Disorder of kidney and ureter, unspecified: N28.9

## 2014-09-07 LAB — CBC WITH DIFFERENTIAL/PLATELET
BASOS ABS: 0 10*3/uL (ref 0.0–0.1)
Basophils Relative: 0 % (ref 0–1)
EOS PCT: 2 % (ref 0–5)
Eosinophils Absolute: 0.2 10*3/uL (ref 0.0–0.7)
HEMATOCRIT: 44.6 % (ref 39.0–52.0)
Hemoglobin: 15.6 g/dL (ref 13.0–17.0)
LYMPHS PCT: 12 % (ref 12–46)
Lymphs Abs: 1.2 10*3/uL (ref 0.7–4.0)
MCH: 35.4 pg — ABNORMAL HIGH (ref 26.0–34.0)
MCHC: 35 g/dL (ref 30.0–36.0)
MCV: 101.1 fL — AB (ref 78.0–100.0)
MONO ABS: 1.1 10*3/uL — AB (ref 0.1–1.0)
MONOS PCT: 11 % (ref 3–12)
Neutro Abs: 7.7 10*3/uL (ref 1.7–7.7)
Neutrophils Relative %: 75 % (ref 43–77)
Platelets: 330 10*3/uL (ref 150–400)
RBC: 4.41 MIL/uL (ref 4.22–5.81)
RDW: 13.1 % (ref 11.5–15.5)
WBC: 10.2 10*3/uL (ref 4.0–10.5)

## 2014-09-07 LAB — TROPONIN I: Troponin I: <0.3 ng/mL (ref <0.30)

## 2014-09-07 LAB — COMPREHENSIVE METABOLIC PANEL
ALT: 44 U/L (ref 0–53)
ANION GAP: 12 (ref 5–15)
AST: 47 U/L — AB (ref 0–37)
Albumin: 3.9 g/dL (ref 3.5–5.2)
Alkaline Phosphatase: 127 U/L — ABNORMAL HIGH (ref 39–117)
BUN: 23 mg/dL (ref 6–23)
CALCIUM: 9.7 mg/dL (ref 8.4–10.5)
CO2: 29 meq/L (ref 19–32)
CREATININE: 2 mg/dL — AB (ref 0.50–1.35)
Chloride: 98 mEq/L (ref 96–112)
GFR, EST AFRICAN AMERICAN: 37 mL/min — AB (ref >90)
GFR, EST NON AFRICAN AMERICAN: 32 mL/min — AB (ref >90)
Glucose, Bld: 153 mg/dL — ABNORMAL HIGH (ref 70–99)
Potassium: 3.7 mEq/L (ref 3.7–5.3)
Sodium: 139 mEq/L (ref 137–147)
Total Bilirubin: 0.4 mg/dL (ref 0.3–1.2)
Total Protein: 8.1 g/dL (ref 6.0–8.3)

## 2014-09-07 LAB — SEDIMENTATION RATE: Sed Rate: 22 mm/hr — ABNORMAL HIGH (ref 0–16)

## 2014-09-07 LAB — LIPASE, BLOOD: LIPASE: 38 U/L (ref 11–59)

## 2014-09-07 LAB — I-STAT CG4 LACTIC ACID, ED: LACTIC ACID, VENOUS: 0.84 mmol/L (ref 0.5–2.2)

## 2014-09-07 MED ORDER — KETOROLAC TROMETHAMINE 15 MG/ML IJ SOLN
15.0000 mg | Freq: Once | INTRAMUSCULAR | Status: AC
  Administered 2014-09-07: 15 mg via INTRAVENOUS
  Filled 2014-09-07: qty 1

## 2014-09-07 MED ORDER — METOCLOPRAMIDE HCL 5 MG/ML IJ SOLN
5.0000 mg | Freq: Once | INTRAMUSCULAR | Status: AC
  Administered 2014-09-07: 5 mg via INTRAVENOUS
  Filled 2014-09-07: qty 2

## 2014-09-07 MED ORDER — ONDANSETRON HCL 4 MG PO TABS: 4.0000 mg | ORAL_TABLET | Freq: Four times a day (QID) | ORAL | Status: DC

## 2014-09-07 MED ORDER — SODIUM CHLORIDE 0.9 % IV BOLUS (SEPSIS)
500.0000 mL | Freq: Once | INTRAVENOUS | Status: AC
  Administered 2014-09-07: 21:00:00 via INTRAVENOUS

## 2014-09-07 MED ORDER — KETOROLAC TROMETHAMINE 30 MG/ML IJ SOLN
INTRAMUSCULAR | Status: AC
  Filled 2014-09-07: qty 1

## 2014-09-07 NOTE — Discharge Instructions (Signed)
General Headache Without Cause °A headache is pain or discomfort felt around the head or neck area. The specific cause of a headache may not be found. There are many causes and types of headaches. A few common ones are: °· Tension headaches. °· Migraine headaches. °· Cluster headaches. °· Chronic daily headaches. °HOME CARE INSTRUCTIONS  °· Keep all follow-up appointments with your caregiver or any specialist referral. °· Only take over-the-counter or prescription medicines for pain or discomfort as directed by your caregiver. °· Lie down in a dark, quiet room when you have a headache. °· Keep a headache journal to find out what may trigger your migraine headaches. For example, write down: °¨ What you eat and drink. °¨ How much sleep you get. °¨ Any change to your diet or medicines. °· Try massage or other relaxation techniques. °· Put ice packs or heat on the head and neck. Use these 3 to 4 times per day for 15 to 20 minutes each time, or as needed. °· Limit stress. °· Sit up straight, and do not tense your muscles. °· Quit smoking if you smoke. °· Limit alcohol use. °· Decrease the amount of caffeine you drink, or stop drinking caffeine. °· Eat and sleep on a regular schedule. °· Get 7 to 9 hours of sleep, or as recommended by your caregiver. °· Keep lights dim if bright lights bother you and make your headaches worse. °SEEK MEDICAL CARE IF:  °· You have problems with the medicines you were prescribed. °· Your medicines are not working. °· You have a change from the usual headache. °· You have nausea or vomiting. °SEEK IMMEDIATE MEDICAL CARE IF:  °· Your headache becomes severe. °· You have a fever. °· You have a stiff neck. °· You have loss of vision. °· You have muscular weakness or loss of muscle control. °· You start losing your balance or have trouble walking. °· You feel faint or pass out. °· You have severe symptoms that are different from your first symptoms. °MAKE SURE YOU:  °· Understand these  instructions. °· Will watch your condition. °· Will get help right away if you are not doing well or get worse. °Document Released: 11/08/2005 Document Revised: 01/31/2012 Document Reviewed: 11/24/2011 °Exit[employer]® Patient Information ©2015 [employer]. This information is not intended to replace advice given to you by your health care provider. Make sure you discuss any questions you have with your health care provider. °Nausea and Vomiting °Nausea is a sick feeling that often comes before throwing up (vomiting). Vomiting is a reflex where stomach contents come out of your mouth. Vomiting can cause severe loss of body fluids (dehydration). Children and elderly adults can become dehydrated quickly, especially if they also have diarrhea. Nausea and vomiting are symptoms of a condition or disease. It is important to find the cause of your symptoms. °CAUSES  °· Direct irritation of the stomach lining. This irritation can result from increased acid production (gastroesophageal reflux disease), infection, food poisoning, taking certain medicines (such as nonsteroidal anti-inflammatory drugs), alcohol use, or tobacco use. °· Signals from the brain. These signals could be caused by a headache, heat exposure, an inner ear disturbance, increased pressure in the brain from injury, infection, a tumor, or a concussion, pain, emotional stimulus, or metabolic problems. °· An obstruction in the gastrointestinal tract (bowel obstruction). °· Illnesses such as diabetes, hepatitis, gallbladder problems, appendicitis, kidney problems, cancer, sepsis, atypical symptoms of a heart attack, or eating disorders. °· Medical treatments such as chemotherapy and   radiation. °· Receiving medicine that makes you sleep (general anesthetic) during surgery. °DIAGNOSIS °Your caregiver may ask for tests to be done if the problems do not improve after a few days. Tests may also be done if symptoms are severe or if the reason for the nausea and vomiting  is not clear. Tests may include: °· Urine tests. °· Blood tests. °· Stool tests. °· Cultures (to look for evidence of infection). °· X-rays or other imaging studies. °Test results can help your caregiver make decisions about treatment or the need for additional tests. °TREATMENT °You need to stay well hydrated. Drink frequently but in small amounts. You may wish to drink water, sports drinks, clear broth, or eat frozen ice pops or gelatin dessert to help stay hydrated. When you eat, eating slowly may help prevent nausea. There are also some antinausea medicines that may help prevent nausea. °HOME CARE INSTRUCTIONS  °· Take all medicine as directed by your caregiver. °· If you do not have an appetite, do not force yourself to eat. However, you must continue to drink fluids. °· If you have an appetite, eat a normal diet unless your caregiver tells you differently. °· Eat a variety of complex carbohydrates (rice, wheat, potatoes, bread), lean meats, yogurt, fruits, and vegetables. °· Avoid high-fat foods because they are more difficult to digest. °· Drink enough water and fluids to keep your urine clear or pale yellow. °· If you are dehydrated, ask your caregiver for specific rehydration instructions. Signs of dehydration may include: °· Severe thirst. °· Dry lips and mouth. °· Dizziness. °· Dark urine. °· Decreasing urine frequency and amount. °· Confusion. °· Rapid breathing or pulse. °SEEK IMMEDIATE MEDICAL CARE IF:  °· You have blood or brown flecks (like coffee grounds) in your vomit. °· You have black or bloody stools. °· You have a severe headache or stiff neck. °· You are confused. °· You have severe abdominal pain. °· You have chest pain or trouble breathing. °· You do not urinate at least once every 8 hours. °· You develop cold or clammy skin. °· You continue to vomit for longer than 24 to 48 hours. °· You have a fever. °MAKE SURE YOU:  °· Understand these instructions. °· Will watch your condition. °· Will  get help right away if you are not doing well or get worse. °Document Released: 11/08/2005 Document Revised: 01/31/2012 Document Reviewed: 04/07/2011 °[employer]® Patient Information ©2015 [employer]. This information is not intended to replace advice given to you by your health care provider. Make sure you discuss any questions you have with your health care provider. ° °

## 2014-09-07 NOTE — ED Notes (Signed)
Pt states he started about 2 weeks ago with pain to behind his left ear, states he saw his primary care doctor for same and told it was arteritis, given a shot for pain.  Pt did not improve and then went to urgent care and then dr hall's PA and told it was shingles.  Pt states he is in severe pain and since starting taking the medication has been nauseated and not able to keep anything down.

## 2014-09-07 NOTE — ED Provider Notes (Signed)
CSN: [id number]     Arrival date & time 09/07/14  1929 History  This chart was scribed for Orpah Greek, * by Jeanell Sparrow, ED Scribe. This patient was seen in room APA05/APA05 and the patient's care was started at 7:56 PM.   Chief Complaint  Patient presents with  . Headache   The history is provided by the patient and the spouse. No language interpreter was used.   HPI Comments: [name] is a 73 y.o. male who presents to the Emergency Department complaining of a constant moderate headache that started about two weeks ago. He reports that he has pain behind his ears that radiates to his neck and spine area. He states that he went to his PCP and was given a shot for pain without any relief. He reports that he went to Urgent Care two days ago and told he had lesion that were possibly cancerous. He states that he was told by Dr. Juel Burrow PA that he has shingles. He reports associated diarrhea and nausea. He states that he has a hx of abdominal surgery.   Past Medical History  Diagnosis Date  . Hypertension   . Myocardial infarction     hx of abnormal ekg showed prior myocardial infarction  . Hyperlipidemia   . Asthma   . Hypothyroidism   . GERD (gastroesophageal reflux disease)     occasional  . Blood transfusion abn reaction or complication, no procedure mishap 1979    reaction due to wrong blood type given  . Renal disorder    Past Surgical History  Procedure Laterality Date  . Tonsillectomy  1962  . Appendectomy  1963  . Cholecystectomy  1979  . Stomach surgery  2003    exploratory surgery, fatty tumors with obstruction?  . Hernia repair  2005  . Lumbar laminectomy/decompression microdiscectomy  07/26/2012    Procedure: LUMBAR LAMINECTOMY/DECOMPRESSION MICRODISCECTOMY;  Surgeon: Johnn Hai, MD;  Location: [facility];  Service: Orthopedics;  Laterality: N/A;  Lumbar Decompression L4-L5  . Colonoscopy  03/28/2008    VQQ:VZDGLO rectum; left-sided transverse  diverticula diminutive polyp; descending colon. Adenomas.  . Colonoscopy N/A 06/07/2013    Procedure: COLONOSCOPY;  Surgeon: Daneil Dolin, MD;  Location: [facility];  Service: Endoscopy;  Laterality: N/A;  9:45   Family History  Problem Relation Age of Onset  . Colon cancer Neg Hx   . Thyroid disease Mother   . Heart disease Mother    History  Substance Use Topics  . Smoking status: Never Smoker   . Smokeless tobacco: Never Used  . Alcohol Use: Yes     Comment: occasional    Review of Systems  Gastrointestinal: Positive for nausea and diarrhea.  Neurological: Positive for headaches.  All other systems reviewed and are negative.  Allergies  Meperidine and related and Sulfa antibiotics  Home Medications   Prior to Admission medications   Medication Sig Start Date End Date Taking? Authorizing Provider  albuterol (PROVENTIL) (2.5 MG/3ML) 0.083% nebulizer solution Take 2.5 mg by nebulization every 6 (six) hours as needed. Wheezing and shortness of breath   Yes Historical Provider, MD  amLODipine (NORVASC) 10 MG tablet Take 10 mg by mouth daily.   Yes Historical Provider, MD  aspirin EC 81 MG tablet Take 81 mg by mouth daily.   Yes Historical Provider, MD  EDECRIN 25 MG tablet Take 50 mg by mouth daily.  09/02/14  Yes Historical Provider, MD  levothyroxine (SYNTHROID, LEVOTHROID) 75 MCG tablet Take  75 mcg by mouth at bedtime.   Yes Historical Provider, MD  Loperamide HCl ([employer] ANTI-DIARRHEA PO) Take 1 tablet by mouth daily as needed (for diarrhea).   Yes Historical Provider, MD  simvastatin (ZOCOR) 40 MG tablet Take 40 mg by mouth at bedtime.   Yes Historical Provider, MD  tamsulosin (FLOMAX) 0.4 MG CAPS capsule Take 0.4 mg by mouth daily.   Yes Historical Provider, MD  valACYclovir (VALTREX) 1000 MG tablet Take 1 g by mouth 2 (two) times daily. 7 day course starting on 09/04/2014 09/04/14  Yes Historical Provider, MD  vitamin B-12 (CYANOCOBALAMIN) 1000 MCG tablet Take 1,000 mcg  by mouth daily.   Yes Historical Provider, MD  levalbuterol (XOPENEX) 1.25 MG/3ML nebulizer solution Inhale 1.25 mg into the lungs 3 (three) times daily as needed. 04/24/13   Historical Provider, MD   BP 117/70  Temp(Src) 98.1 F (36.7 C) (Oral)  Resp 18  Ht 5\' 11"  (1.803 m)  Wt 211 lb (95.709 kg)  BMI 29.44 kg/m2  SpO2 96% Physical Exam  Nursing note and vitals reviewed. Constitutional: He is oriented to person, place, and time. He appears well-developed and well-nourished. No distress.  HENT:  Head: Normocephalic and atraumatic.  Mouth/Throat: No oropharyngeal exudate.  TTP to left occipital scalp with no rash.  Eyes: Pupils are equal, round, and reactive to light.  Neck: Normal range of motion. Neck supple. No spinous process tenderness and no muscular tenderness present. No Brudzinski's sign and no Kernig's sign noted.  Cardiovascular: Normal rate, regular rhythm and normal heart sounds.  Exam reveals no gallop and no friction rub.   No murmur heard. Pulmonary/Chest: Effort normal and breath sounds normal. No respiratory distress. He has no wheezes. He has no rales.  Abdominal: Soft. Bowel sounds are normal. He exhibits no distension and no mass. There is no tenderness. There is no rebound and no guarding.  Musculoskeletal: Normal range of motion. He exhibits no edema and no tenderness.  Neurological: He is alert and oriented to person, place, and time.  Skin: Skin is warm and dry.  Psychiatric: He has a normal mood and affect.    ED Course  Procedures (including critical care time) DIAGNOSTIC STUDIES: Oxygen Saturation is 96% on RA, normal by my interpretation.    COORDINATION OF CARE: 8:00 PM- Pt advised of plan for treatment and pt agrees.  Labs Review Labs Reviewed  CBC WITH DIFFERENTIAL - Abnormal; Notable for the following:    MCV 101.1 (*)    MCH 35.4 (*)    Monocytes Absolute 1.1 (*)    All other components within normal limits  COMPREHENSIVE METABOLIC PANEL -  Abnormal; Notable for the following:    Glucose, Bld 153 (*)    Creatinine, Ser 2.00 (*)    AST 47 (*)    Alkaline Phosphatase 127 (*)    GFR calc non Af Amer 32 (*)    GFR calc Af Amer 37 (*)    All other components within normal limits  SEDIMENTATION RATE - Abnormal; Notable for the following:    Sed Rate 22 (*)    All other components within normal limits  LIPASE, BLOOD  TROPONIN I  URINALYSIS, ROUTINE W REFLEX MICROSCOPIC  I-STAT CG4 LACTIC ACID, ED    Imaging Review Ct Head Wo Contrast  09/07/2014   CLINICAL DATA:  Headache, nausea and dizziness for the past 2 days.  EXAM: CT HEAD WITHOUT CONTRAST  TECHNIQUE: Contiguous axial images were obtained from the base of the  skull through the vertex without intravenous contrast.  COMPARISON:  12/28/2004.  FINDINGS: Diffusely enlarged ventricles and subarachnoid spaces. Patchy white matter low density in both cerebral hemispheres. No intracranial hemorrhage, mass lesion or CT evidence of acute infarction. Right maxillary sinus retention cyst. Unremarkable bones.  IMPRESSION: 1. No acute abnormality. 2. Mild atrophy and minimal chronic small vessel white matter ischemic changes.   Electronically Signed   By: Enrique Sack M.D.   On: 09/07/2014 21:25     EKG Interpretation   Date/Time:  Saturday September 07 2014 20:15:06 EDT Ventricular Rate:  89 PR Interval:  169 QRS Duration: 95 QT Interval:  347 QTC Calculation: 422 R Axis:   175 Text Interpretation:  Sinus rhythm Left posterior fascicular block  Abnormal R-wave progression, late transition Borderline T wave  abnormalities Baseline wander in lead(s) V5 V6 Non-specific ST-t changes  No previous tracing Confirmed by Corlette Ciano  MD, Eduardo Honor ([id number]) on  09/07/2014 8:19:12 PM      MDM   Final diagnoses:  None   headache  Vomiting  Patient presents to the ER for evaluation of headache. Patient reports the symptoms began 2 weeks ago. He has been seen by primary care, urgent care  and dermatology. Dermatology for the shingles, started him on Valtrex. Patient reports that he has significant vomiting every time he tries to take Valtrex, however. Now he hasn't been able to hold anything down, thinks he is dehydrated.  Cornelia Copa is unremarkable. Abdominal exam is benign, no signs of acute abdominal process. Recent vital signs are stable. No fever. Normal blood pressure. Patient does have tenderness of the skin of the scalp without any overlying lesions.  The head was unremarkable. Blood work is unremarkable. Sedimentation rate is not significantly elevated, symptoms are not consistent with a temporal arteritis. Pain is in the posterior occipital region, no vision changes, eye pain, jaw claudication.  She is feeling much better after small dose of IV Toradol, IV fluids, IV Reglan. No further nausea. Headache is improved. Patient will be discharged with Zofran, follow up with primary doctor in the office. Return if symptoms worsen. He was advised that he can stop the Valtrex.    I personally performed the services described in this documentation, which was scribed in my presence. The recorded information has been reviewed and is accurate.      Orpah Greek, MD 09/07/14 2218

## 2014-09-10 DIAGNOSIS — E559 Vitamin D deficiency, unspecified: Secondary | ICD-10-CM | POA: Diagnosis not present

## 2014-09-10 DIAGNOSIS — I10 Essential (primary) hypertension: Secondary | ICD-10-CM | POA: Diagnosis not present

## 2014-09-10 DIAGNOSIS — N183 Chronic kidney disease, stage 3 (moderate): Secondary | ICD-10-CM | POA: Diagnosis not present

## 2014-09-10 DIAGNOSIS — N2581 Secondary hyperparathyroidism of renal origin: Secondary | ICD-10-CM | POA: Diagnosis not present

## 2014-09-10 DIAGNOSIS — B0222 Postherpetic trigeminal neuralgia: Secondary | ICD-10-CM | POA: Diagnosis not present

## 2014-09-10 DIAGNOSIS — R7301 Impaired fasting glucose: Secondary | ICD-10-CM | POA: Diagnosis not present

## 2014-09-30 ENCOUNTER — Ambulatory Visit (HOSPITAL_COMMUNITY): Payer: Medicare Other

## 2014-10-04 NOTE — Progress Notes (Signed)
This encounter was created in error - please disregard.  This encounter was created in error - please disregard.

## 2014-10-07 ENCOUNTER — Ambulatory Visit (HOSPITAL_COMMUNITY): Payer: Medicare Other

## 2014-10-15 ENCOUNTER — Ambulatory Visit (INDEPENDENT_AMBULATORY_CARE_PROVIDER_SITE_OTHER): Payer: Medicare Other | Admitting: Urology

## 2014-10-15 ENCOUNTER — Other Ambulatory Visit: Payer: Self-pay | Admitting: Urology

## 2014-10-15 DIAGNOSIS — N529 Male erectile dysfunction, unspecified: Secondary | ICD-10-CM | POA: Diagnosis not present

## 2014-10-15 DIAGNOSIS — N281 Cyst of kidney, acquired: Secondary | ICD-10-CM

## 2014-10-21 ENCOUNTER — Ambulatory Visit (HOSPITAL_COMMUNITY)
Admission: RE | Admit: 2014-10-21 | Discharge: 2014-10-21 | Disposition: A | Discharge status: Home / Self Care | Payer: Medicare Other | Source: Ambulatory Visit | Attending: Hematology and Oncology | Admitting: Hematology and Oncology

## 2014-10-21 ENCOUNTER — Encounter (HOSPITAL_COMMUNITY): Payer: Self-pay

## 2014-10-21 ENCOUNTER — Encounter (HOSPITAL_COMMUNITY): Payer: Medicare Other | Attending: Hematology and Oncology

## 2014-10-21 VITALS — BP 144/78 | HR 87 | Temp 98.3°F | Resp 20 | Wt 213.7 lb

## 2014-10-21 DIAGNOSIS — E8809 Other disorders of plasma-protein metabolism, not elsewhere classified: Secondary | ICD-10-CM

## 2014-10-21 DIAGNOSIS — N183 Chronic kidney disease, stage 3 unspecified: Secondary | ICD-10-CM

## 2014-10-21 DIAGNOSIS — C9000 Multiple myeloma not having achieved remission: Secondary | ICD-10-CM | POA: Diagnosis not present

## 2014-10-21 DIAGNOSIS — D892 Hypergammaglobulinemia, unspecified: Secondary | ICD-10-CM | POA: Diagnosis not present

## 2014-10-21 DIAGNOSIS — I10 Essential (primary) hypertension: Secondary | ICD-10-CM

## 2014-10-21 DIAGNOSIS — E039 Hypothyroidism, unspecified: Secondary | ICD-10-CM | POA: Diagnosis not present

## 2014-10-21 DIAGNOSIS — I700 Atherosclerosis of aorta: Secondary | ICD-10-CM | POA: Diagnosis not present

## 2014-10-21 DIAGNOSIS — N189 Chronic kidney disease, unspecified: Secondary | ICD-10-CM | POA: Insufficient documentation

## 2014-10-21 DIAGNOSIS — D8989 Other specified disorders involving the immune mechanism, not elsewhere classified: Secondary | ICD-10-CM

## 2014-10-21 DIAGNOSIS — Z9049 Acquired absence of other specified parts of digestive tract: Secondary | ICD-10-CM | POA: Diagnosis not present

## 2014-10-21 DIAGNOSIS — D891 Cryoglobulinemia: Secondary | ICD-10-CM | POA: Diagnosis not present

## 2014-10-21 DIAGNOSIS — E038 Other specified hypothyroidism: Secondary | ICD-10-CM

## 2014-10-21 LAB — COMPREHENSIVE METABOLIC PANEL
ALK PHOS: 102 U/L (ref 39–117)
ALT: 19 U/L (ref 0–53)
AST: 30 U/L (ref 0–37)
Albumin: 4.4 g/dL (ref 3.5–5.2)
Anion gap: 14 (ref 5–15)
BUN: 18 mg/dL (ref 6–23)
CO2: 29 meq/L (ref 19–32)
Calcium: 10 mg/dL (ref 8.4–10.5)
Chloride: 96 mEq/L (ref 96–112)
Creatinine, Ser: 1.56 mg/dL — ABNORMAL HIGH (ref 0.50–1.35)
GFR calc non Af Amer: 43 mL/min — ABNORMAL LOW (ref >90)
GFR, EST AFRICAN AMERICAN: 50 mL/min — AB (ref >90)
GLUCOSE: 106 mg/dL — AB (ref 70–99)
Potassium: 4.2 mEq/L (ref 3.7–5.3)
Sodium: 139 mEq/L (ref 137–147)
Total Bilirubin: 0.6 mg/dL (ref 0.3–1.2)
Total Protein: 8.5 g/dL — ABNORMAL HIGH (ref 6.0–8.3)

## 2014-10-21 LAB — CBC WITH DIFFERENTIAL/PLATELET
Basophils Absolute: 0 10*3/uL (ref 0.0–0.1)
Basophils Relative: 1 % (ref 0–1)
Eosinophils Absolute: 0.3 10*3/uL (ref 0.0–0.7)
Eosinophils Relative: 3 % (ref 0–5)
HCT: 42.8 % (ref 39.0–52.0)
Hemoglobin: 14.9 g/dL (ref 13.0–17.0)
Lymphocytes Relative: 27 % (ref 12–46)
Lymphs Abs: 2.2 10*3/uL (ref 0.7–4.0)
MCH: 35.4 pg — ABNORMAL HIGH (ref 26.0–34.0)
MCHC: 34.8 g/dL (ref 30.0–36.0)
MCV: 101.7 fL — ABNORMAL HIGH (ref 78.0–100.0)
Monocytes Absolute: 0.7 10*3/uL (ref 0.1–1.0)
Monocytes Relative: 9 % (ref 3–12)
NEUTROS PCT: 60 % (ref 43–77)
Neutro Abs: 5 10*3/uL (ref 1.7–7.7)
PLATELETS: 350 10*3/uL (ref 150–400)
RBC: 4.21 MIL/uL — ABNORMAL LOW (ref 4.22–5.81)
RDW: 13.7 % (ref 11.5–15.5)
WBC: 8.2 10*3/uL (ref 4.0–10.5)

## 2014-10-21 LAB — LACTATE DEHYDROGENASE: LDH: 319 U/L — ABNORMAL HIGH (ref 94–250)

## 2014-10-21 NOTE — Progress Notes (Signed)
[place] A. Barnet Glasgow, M.D.  NEW PATIENT EVALUATION   Name: [name] Date: 10/21/2014 MRN: [id number] DOB: 1941/07/15  PCP: Delphina Cahill, MD   REFERRING PHYSICIAN: Delphina Cahill, MD  REASON FOR REFERRAL: Elevated Kappa to  lambda light chain ratio of 5.07 in October 2015     HISTORY OF PRESENT ILLNESS:[name] is a 73 y.o. male who is referred by her nephrologist because of elevated Kappa to Lambda ratio of 5.07 in October 2015 in the setting of stage III chronic kidney disease and hypertension. In conjunction with a routine physical examination, the patient had lab work done in October 2015 with findings of an abnormal Kappa to Lambda light chain ratio. He was seen by urologist who saw some abnormalities on renal ultrasound and for that reason the patient is to undergo an MRI of the kidneys tomorrow. PSA was normal according to the patient. He feels well with excellent appetite and no nausea, vomiting, fever, night sweats, change in weight, bone pain, lower extremity swelling or redness, diarrhea, constipation, urinary hesitancy, urine incontinence, cough, wheezing, skin rash, headache, or seizures.   PAST MEDICAL HISTORY:  has a past medical history of Hypertension; Myocardial infarction; Hyperlipidemia; Asthma; Hypothyroidism; GERD (gastroesophageal reflux disease); Blood transfusion abn reaction or complication, no procedure mishap (1979); and Renal disorder.     PAST SURGICAL HISTORY: Past Surgical History  Procedure Laterality Date  . Tonsillectomy  1962  . Appendectomy  1963  . Cholecystectomy  1979  . Stomach surgery  2003    exploratory surgery, fatty tumors with obstruction?  . Hernia repair  2005  . Lumbar laminectomy/decompression microdiscectomy  07/26/2012    Procedure: LUMBAR LAMINECTOMY/DECOMPRESSION MICRODISCECTOMY;  Surgeon: Johnn Hai, MD;  Location: [facility];  Service: Orthopedics;  Laterality: N/A;   Lumbar Decompression L4-L5  . Colonoscopy  03/28/2008    YSH:UOHFGB rectum; left-sided transverse diverticula diminutive polyp; descending colon. Adenomas.  . Colonoscopy N/A 06/07/2013    Procedure: COLONOSCOPY;  Surgeon: Daneil Dolin, MD;  Location: [facility];  Service: Endoscopy;  Laterality: N/A;  9:45     CURRENT MEDICATIONS: has a current medication list which includes the following prescription(s): acetaminophen, albuterol, amlodipine, aspirin ec, cholecalciferol, levalbuterol, levothyroxine, simvastatin, tamsulosin, triamterene-hydrochlorothiazide, edecrin, loperamide hcl, ondansetron, valacyclovir, and vitamin b-12.   ALLERGIES: Meperidine and related and Sulfa antibiotics   SOCIAL HISTORY:  reports that he has never smoked. He has never used smokeless tobacco. He reports that he drinks alcohol. He reports that he does not use illicit drugs.   FAMILY HISTORY: family history includes Cancer in his sister; Clotting disorder in his father; Heart disease in his mother; Hypertension in his father, mother, and sister; Thyroid disease in his mother. There is no history of Colon cancer.    REVIEW OF SYSTEMS:  Other than that discussed above is noncontributory.    PHYSICAL EXAM:  weight is 213 lb 11.2 oz (96.934 kg). His oral temperature is 98.3 F (36.8 C). His blood pressure is 144/78 and his pulse is 87. His respiration is 20 and oxygen saturation is 98%.    GENERAL:alert, no distress and comfortable SKIN: skin color, texture, turgor are normal, no rashes or significant lesions EYES: normal, Conjunctiva are pink and non-injected, sclera clear OROPHARYNX:no exudate, no erythema and lips, buccal mucosa, and tongue normal  NECK: supple, thyroid normal size, non-tender, without nodularity CHEST: Normal AP diameter with no breast masses. LYMPH:  no palpable lymphadenopathy in the cervical, axillary or inguinal LUNGS: clear to auscultation and percussion with normal breathing  effort HEART: regular rate & rhythm and no murmurs ABDOMEN:abdomen soft, non-tender and normal bowel sounds. Liver and spleen not palpable. MUSCULOSKELETALl:no cyanosis of digits, no clubbing or edema  NEURO: alert & oriented x 3 with fluent speech, no focal motor/sensory deficits    LABORATORY DATA:   08/29/2014:  Kappa to Lambda light chain ratio 5.07 with normal being up to 1.65. 08/29/2014:  24 hour urine protein electrophoresis showed no significant bands.  Office Visit on 10/21/2014  Component Date Value Ref Range Status  . WBC 10/21/2014 8.2  4.0 - 10.5 K/uL Final  . RBC 10/21/2014 4.21* 4.22 - 5.81 MIL/uL Final  . Hemoglobin 10/21/2014 14.9  13.0 - 17.0 g/dL Final  . HCT 10/21/2014 42.8  39.0 - 52.0 % Final  . MCV 10/21/2014 101.7* 78.0 - 100.0 fL Final  . MCH 10/21/2014 35.4* 26.0 - 34.0 pg Final  . MCHC 10/21/2014 34.8  30.0 - 36.0 g/dL Final  . RDW 10/21/2014 13.7  11.5 - 15.5 % Final  . Platelets 10/21/2014 350  150 - 400 K/uL Final  . Neutrophils Relative % 10/21/2014 60  43 - 77 % Final  . Neutro Abs 10/21/2014 5.0  1.7 - 7.7 K/uL Final  . Lymphocytes Relative 10/21/2014 27  12 - 46 % Final  . Lymphs Abs 10/21/2014 2.2  0.7 - 4.0 K/uL Final  . Monocytes Relative 10/21/2014 9  3 - 12 % Final  . Monocytes Absolute 10/21/2014 0.7  0.1 - 1.0 K/uL Final  . Eosinophils Relative 10/21/2014 3  0 - 5 % Final  . Eosinophils Absolute 10/21/2014 0.3  0.0 - 0.7 K/uL Final  . Basophils Relative 10/21/2014 1  0 - 1 % Final  . Basophils Absolute 10/21/2014 0.0  0.0 - 0.1 K/uL Final  . Sodium 10/21/2014 139  137 - 147 mEq/L Final  . Potassium 10/21/2014 4.2  3.7 - 5.3 mEq/L Final  . Chloride 10/21/2014 96  96 - 112 mEq/L Final  . CO2 10/21/2014 29  19 - 32 mEq/L Final  . Glucose, Bld 10/21/2014 106* 70 - 99 mg/dL Final  . BUN 10/21/2014 18  6 - 23 mg/dL Final  . Creatinine, Ser 10/21/2014 1.56* 0.50 - 1.35 mg/dL Final  . Calcium 10/21/2014 10.0  8.4 - 10.5 mg/dL Final  . Total  Protein 10/21/2014 8.5* 6.0 - 8.3 g/dL Final  . Albumin 10/21/2014 4.4  3.5 - 5.2 g/dL Final  . AST 10/21/2014 30  0 - 37 U/L Final  . ALT 10/21/2014 19  0 - 53 U/L Final  . Alkaline Phosphatase 10/21/2014 102  39 - 117 U/L Final  . Total Bilirubin 10/21/2014 0.6  0.3 - 1.2 mg/dL Final  . GFR calc non Af Amer 10/21/2014 43* >90 mL/min Final  . GFR calc Af Amer 10/21/2014 50* >90 mL/min Final   Comment: (NOTE) The eGFR has been calculated using the CKD EPI equation. This calculation has not been validated in all clinical situations. eGFR's persistently <90 mL/min signify possible Chronic Kidney Disease.   . Anion gap 10/21/2014 14  5 - 15 Final  . LDH 10/21/2014 319* 94 - 250 U/L Final   SLIGHT HEMOLYSIS  . Total Protein 10/21/2014 PENDING  6.0 - 8.3 g/dL Incomplete  . Albumin ELP 10/21/2014 PENDING  55.8 - 66.1 % Incomplete  . Alpha-1-Globulin 10/21/2014 PENDING  2.9 - 4.9 % Incomplete  .  Alpha-2-Globulin 10/21/2014 PENDING  7.1 - 11.8 % Incomplete  . Beta Globulin 10/21/2014 PENDING  4.7 - 7.2 % Incomplete  . Beta 2 10/21/2014 PENDING  3.2 - 6.5 % Incomplete  . Gamma Globulin 10/21/2014 PENDING  11.1 - 18.8 % Incomplete  . M-Spike, % 10/21/2014 PENDING   Incomplete  . SPE Interp. 10/21/2014 PENDING   Incomplete  . Comment 10/21/2014 PENDING   Incomplete  . IgG (Immunoglobin G), Serum 10/21/2014 PENDING  694 - 1618 mg/dL Incomplete  . IgA 10/21/2014 PENDING  68 - 378 mg/dL Incomplete  . IgM, Serum 10/21/2014 PENDING  60 - 263 mg/dL Incomplete  . Immunofix Electr Int 10/21/2014 PENDING   Incomplete    Urinalysis    Component Value Date/Time   COLORURINE YELLOW 07/18/2012 [place] 07/18/2012 1112   LABSPEC 1.023 07/18/2012 1112   PHURINE 5.5 07/18/2012 1112   GLUCOSEU NEGATIVE 07/18/2012 1112   HGBUR NEGATIVE 07/18/2012 1112   BILIRUBINUR NEGATIVE 07/18/2012 1112   KETONESUR NEGATIVE 07/18/2012 1112   PROTEINUR NEGATIVE 07/18/2012 1112   UROBILINOGEN 0.2  07/18/2012 1112   NITRITE NEGATIVE 07/18/2012 1112   LEUKOCYTESUR NEGATIVE 07/18/2012 1112      '@RADIOGRAPHY'$ : Dg Bone Survey Met  10/21/2014   CLINICAL DATA:  Paraproteinemia with elevated kappa to lambda light chain ratio by blood work and history of chronic kidney disease.  EXAM: METASTATIC BONE SURVEY  COMPARISON:  Prior spine films and chest x-ray from 2013.  FINDINGS: No lytic or sclerotic bone lesions are identified in the imaged skeleton. No evidence of erosions or fractures. Spondylosis is present in the cervical, thoracic and lumbar spines. The most significant disc disease is at L5-S1.  Clips are present related to prior cholecystectomy. Mild atherosclerotic calcification is seen involving the abdominal aorta. Degenerative changes are seen involving both AC joints. Mild medial and lateral joint space narrowing present in both knees.  There is no evidence of pulmonary edema, consolidation, pneumothorax, nodule or pleural fluid. The heart size and mediastinal contours are normal. No soft tissue abnormalities identified. Visualized abdominal bowel gas is unremarkable.  IMPRESSION: No evidence of abnormal bony lesions by radiographic bone survey.   Electronically Signed   By: Aletta Edouard M.D.   On: 10/21/2014 15:43      Status: Final result       PACS Images     Show images for US Renal     Study Result     CLINICAL DATA: Stage III chronic kidney disease, personal history of hypertension, hyperlipidemia, asthma, myocardial infarction  EXAM: RENAL/URINARY TRACT ULTRASOUND COMPLETE  COMPARISON: CT abdomen and pelvis 11/29/2005  FINDINGS: Right Kidney:  Length: 11.5 cm. Cortical thinning. Increased cortical echogenicity. No mass, hydronephrosis or shadowing calcification.  Left Kidney:  Length: 14.0 cm. Cortical thinning. Increased cortical echogenicity. Hypoechoic nodule at upper pole 2.4 2.7 x 2.3 cm, located at an area of cortical scarring on the prior CT  when it measured 1.4 x 1.1 cm in axial dimensions, question complicated cyst containing internal echogenicity. No additional mass, hydronephrosis or shadowing calcification.  Bladder:  Normal appearance. LEFT ureteral jet visualized. RIGHT ureteral jet not seen during imaging.  IMPRESSION: BILATERAL renal atrophy and medical renal disease changes.  Question complicated cyst at upper pole LEFT kidney 2.4 x 2.7 x 2.3 cm.  This lesion has increased in size since 2007 CT.  Characterization by MR imaging recommended due to complicating features.   Electronically Signed  By: Lavonia Dana M.D.  On: 10/08/  PATHOLOGY: Peripheral smear failed to reveal evidence of rouleaux formation.   IMPRESSION:  #1. Paraproteinemia manifesting as an elevated kappa to lambda light chain chain ratio of 5.07 in the setting of a normal 24 hour urine protein electrophoresis. #2. Left renal cyst on ultrasound. Diminished kidney size consistent with medical renal disease. #3. Chronic kidney disease, stage III.   PLAN:  #1. Repeat myeloma panel plus light chains. #2. Skeletal x-ray survey. #3. MRI of the kidneys scheduled for oh/11/2013. #4. Follow-up in one week with discussion of results and recommendations regarding further intervention after reviewing lab work and kidney MRI.  I appreciate the opportunity of sharing in his care.   Doroteo Bradford, MD 10/21/2014 7:42 PM   DISCLAIMER:  This note was dictated with voice recognition softwre.  Similar sounding words can inadvertently be transcribed inaccurately and may not be corrected upon review.

## 2014-10-21 NOTE — Progress Notes (Signed)
[name]'s reason for visit today are for labs as scheduled per MD orders.  Venipuncture performed with a 23 gauge butterfly needle to L Antecubital.  [name] tolerated venipuncture well and without incident; questions were answered and patient was discharged.

## 2014-10-21 NOTE — Patient Instructions (Signed)
[facility] Discharge Instructions  RECOMMENDATIONS MADE BY THE CONSULTANT AND ANY TEST RESULTS WILL BE SENT TO YOUR REFERRING PHYSICIAN.  We will see you in one week for follow up. Please call for any questions or concerns.    Thank you for choosing [facility] to provide your oncology and hematology care.  To afford each patient quality time with our providers, please arrive at least 15 minutes before your scheduled appointment time.  With your help, our goal is to use those 15 minutes to complete the necessary work-up to ensure our physicians have the information they need to help with your evaluation and healthcare recommendations.    Effective January 1st, 2014, we ask that you re-schedule your appointment with our physicians should you arrive 10 or more minutes late for your appointment.  We strive to give you quality time with our providers, and arriving late affects you and other patients whose appointments are after yours.    Again, thank you for choosing [facility].  Our hope is that these requests will decrease the amount of time that you wait before being seen by our physicians.       _____________________________________________________________  Should you have questions after your visit to [facility], please contact our office at [phone] between the hours of 8:30 a.m. and 4:30 p.m.  Voicemails left after 4:30 p.m. will not be returned until the following business day.  For prescription refill requests, have your pharmacy contact our office with your prescription refill request.    _______________________________________________________________  We hope that we have given you very good care.  You may receive a patient satisfaction survey in the mail, please complete it and return it as soon as possible.  We value your feedback!  _______________________________________________________________  Have you asked  about our STAR program?  STAR stands for Survivorship Training and Rehabilitation, and this is a nationally recognized cancer care program that focuses on survivorship and rehabilitation.  Cancer and cancer treatments may cause problems, such as, pain, making you feel tired and keeping you from doing the things that you need or want to do. Cancer rehabilitation can help. Our goal is to reduce these troubling effects and help you have the best quality of life possible.  You may receive a survey from a nurse that asks questions about your current state of health.  Based on the survey results, all eligible patients will be referred to the [facility] program for an evaluation so we can better serve you!  A frequently asked questions sheet is available upon request.

## 2014-10-22 ENCOUNTER — Ambulatory Visit (HOSPITAL_COMMUNITY)
Admission: RE | Admit: 2014-10-22 | Discharge: 2014-10-22 | Disposition: A | Discharge status: Home / Self Care | Payer: Medicare Other | Source: Ambulatory Visit | Attending: Urology | Admitting: Urology

## 2014-10-22 DIAGNOSIS — N281 Cyst of kidney, acquired: Secondary | ICD-10-CM | POA: Diagnosis not present

## 2014-10-22 DIAGNOSIS — N2889 Other specified disorders of kidney and ureter: Secondary | ICD-10-CM | POA: Diagnosis not present

## 2014-10-22 LAB — TSH: TSH: 1.93 u[IU]/mL (ref 0.350–4.500)

## 2014-10-22 LAB — KAPPA/LAMBDA LIGHT CHAINS
KAPPA FREE LGHT CHN: 1.92 mg/dL (ref 0.33–1.94)
Kappa, lambda light chain ratio: 4 — ABNORMAL HIGH (ref 0.26–1.65)
LAMDA FREE LIGHT CHAINS: 0.48 mg/dL — AB (ref 0.57–2.63)

## 2014-10-22 MED ORDER — GADOBENATE DIMEGLUMINE 529 MG/ML IV SOLN
10.0000 mL | Freq: Once | INTRAVENOUS | Status: AC | PRN
  Administered 2014-10-22: 10 mL via INTRAVENOUS

## 2014-10-23 LAB — MULTIPLE MYELOMA PANEL, SERUM
ALPHA-2-GLOBULIN: 12.7 % — AB (ref 7.1–11.8)
Albumin ELP: 56.4 % (ref 55.8–66.1)
Alpha-1-Globulin: 4.2 % (ref 2.9–4.9)
BETA GLOBULIN: 6.2 % (ref 4.7–7.2)
Beta 2: 6.9 % — ABNORMAL HIGH (ref 3.2–6.5)
Gamma Globulin: 13.6 % (ref 11.1–18.8)
IGA: 265 mg/dL (ref 68–379)
IgG (Immunoglobin G), Serum: 1160 mg/dL (ref 650–1600)
IgM, Serum: 47 mg/dL — ABNORMAL LOW (ref 41–251)
M-SPIKE, %: NOT DETECTED g/dL
Total Protein: 7.6 g/dL (ref 6.0–8.3)

## 2014-10-23 LAB — BETA 2 MICROGLOBULIN, SERUM: Beta-2 Microglobulin: 3.33 mg/L — ABNORMAL HIGH (ref <=2.51)

## 2014-10-28 ENCOUNTER — Encounter (HOSPITAL_COMMUNITY): Payer: Self-pay

## 2014-10-28 ENCOUNTER — Encounter (HOSPITAL_COMMUNITY): Payer: Medicare Other | Attending: Hematology and Oncology

## 2014-10-28 VITALS — BP 139/75 | HR 73 | Temp 97.8°F | Resp 16 | Wt 214.8 lb

## 2014-10-28 DIAGNOSIS — D892 Hypergammaglobulinemia, unspecified: Secondary | ICD-10-CM

## 2014-10-28 DIAGNOSIS — N281 Cyst of kidney, acquired: Secondary | ICD-10-CM | POA: Diagnosis not present

## 2014-10-28 DIAGNOSIS — N183 Chronic kidney disease, stage 3 unspecified: Secondary | ICD-10-CM

## 2014-10-28 DIAGNOSIS — C9000 Multiple myeloma not having achieved remission: Secondary | ICD-10-CM | POA: Insufficient documentation

## 2014-10-28 DIAGNOSIS — I10 Essential (primary) hypertension: Secondary | ICD-10-CM | POA: Diagnosis not present

## 2014-10-28 DIAGNOSIS — D8989 Other specified disorders involving the immune mechanism, not elsewhere classified: Secondary | ICD-10-CM

## 2014-10-28 DIAGNOSIS — E039 Hypothyroidism, unspecified: Secondary | ICD-10-CM | POA: Insufficient documentation

## 2014-10-28 NOTE — Progress Notes (Signed)
[place], [facility], [place]  [place] [address]  DIAGNOSIS: Light chain disease, kappa type  Essential hypertension  Chronic kidney disease, stage 3 (moderate)  Bilateral renal cysts  Chief Complaint  Patient presents with  . Light chain diseas, kappa type    CURRENT THERAPY: Follow-up after additional testing for abnormal kappa to lambda light chain ratio of 5.07 in October 2015. He continues to feel well with no specific complaints. Appetite is good with normal bowel movements and no episodes of lower extremity swelling or redness, chest pain, PND, orthopnea, easy satiety, bone pain, some joint pain, skin rash, headache, or seizures.  INTERVAL HISTORY: [name] 73 y.o. male returns for follow-up after additional testing in the setting of stage III chronic kidney disease and hypertension of an elevated M the light chain ratio of 5.07 documented in October 2015. The discovery was majoring in routine physical examination and lab work.   MEDICAL HISTORY: Past Medical History  Diagnosis Date  . Hypertension   . Myocardial infarction     hx of abnormal ekg showed prior myocardial infarction  . Hyperlipidemia   . Asthma   . Hypothyroidism   . GERD (gastroesophageal reflux disease)     occasional  . Blood transfusion abn reaction or complication, no procedure mishap 1979    reaction due to wrong blood type given  . Renal disorder     INTERIM HISTORY: has Pain in joint, ankle and foot; Sprain of ankle, unspecified site; and Personal history of colonic polyps on his problem list.    ALLERGIES:  is allergic to meperidine and related and sulfa antibiotics.  MEDICATIONS: has a current medication list which includes the following prescription(s): acetaminophen, advair diskus, albuterol, amlodipine, aspirin ec, cholecalciferol, levalbuterol, levothyroxine, ondansetron, simvastatin, tamsulosin,  triamterene-hydrochlorothiazide, and vitamin b-12.  SURGICAL HISTORY:  Past Surgical History  Procedure Laterality Date  . Tonsillectomy  1962  . Appendectomy  1963  . Cholecystectomy  1979  . Stomach surgery  2003    exploratory surgery, fatty tumors with obstruction?  . Hernia repair  2005  . Lumbar laminectomy/decompression microdiscectomy  07/26/2012    Procedure: LUMBAR LAMINECTOMY/DECOMPRESSION MICRODISCECTOMY;  Surgeon: Johnn Hai, MD;  Location: [facility];  Service: Orthopedics;  Laterality: N/A;  Lumbar Decompression L4-L5  . Colonoscopy  03/28/2008    [id number] rectum; left-sided transverse diverticula diminutive polyp; descending colon. Adenomas.  . Colonoscopy N/A 06/07/2013    Procedure: COLONOSCOPY;  Surgeon: Daneil Dolin, MD;  Location: [facility];  Service: Endoscopy;  Laterality: N/A;  9:45    FAMILY HISTORY: family history includes Cancer in his sister; Clotting disorder in his father; Heart disease in his mother; Hypertension in his father, mother, and sister; Thyroid disease in his mother. There is no history of Colon cancer.  SOCIAL HISTORY:  reports that he has never smoked. He has never used smokeless tobacco. He reports that he drinks alcohol. He reports that he does not use illicit drugs.  REVIEW OF SYSTEMS:  Other than that discussed above is noncontributory.  PHYSICAL EXAMINATION: ECOG PERFORMANCE STATUS: 0 - Asymptomatic  Blood pressure 139/75, pulse 73, temperature 97.8 F (36.6 C), temperature source Oral, resp. rate 16, weight 214 lb 12.8 oz (97.433 kg), SpO2 98 %.  GENERAL:alert, no distress and comfortable SKIN: skin color, texture, turgor are normal, no rashes or significant lesions EYES: PERLA; Conjunctiva are pink  and non-injected, sclera clear SINUSES: No redness or tenderness over maxillary or ethmoid sinuses OROPHARYNX:no exudate, no erythema on lips, buccal mucosa, or tongue. NECK: supple, thyroid normal size, non-tender, without  nodularity. No masses CHEST: Normal AP diameter with no breast masses. LYMPH:  no palpable lymphadenopathy in the cervical, axillary or inguinal LUNGS: clear to auscultation and percussion with normal breathing effort HEART: regular rate & rhythm and no murmurs. ABDOMEN:abdomen soft, non-tender and normal bowel sounds. Liver and spleen not enlarged. MUSCULOSKELETAL:no cyanosis of digits and no clubbing. Range of motion normal.  NEURO: alert & oriented x 3 with fluent speech, no focal motor/sensory deficits. DTRs normal.   LABORATORY DATA: Office Visit on 10/21/2014  Component Date Value Ref Range Status  . WBC 10/21/2014 8.2  4.0 - 10.5 K/uL Final  . RBC 10/21/2014 4.21* 4.22 - 5.81 MIL/uL Final  . Hemoglobin 10/21/2014 14.9  13.0 - 17.0 g/dL Final  . HCT 10/21/2014 42.8  39.0 - 52.0 % Final  . MCV 10/21/2014 101.7* 78.0 - 100.0 fL Final  . MCH 10/21/2014 35.4* 26.0 - 34.0 pg Final  . MCHC 10/21/2014 34.8  30.0 - 36.0 g/dL Final  . RDW 10/21/2014 13.7  11.5 - 15.5 % Final  . Platelets 10/21/2014 350  150 - 400 K/uL Final  . Neutrophils Relative % 10/21/2014 60  43 - 77 % Final  . Neutro Abs 10/21/2014 5.0  1.7 - 7.7 K/uL Final  . Lymphocytes Relative 10/21/2014 27  12 - 46 % Final  . Lymphs Abs 10/21/2014 2.2  0.7 - 4.0 K/uL Final  . Monocytes Relative 10/21/2014 9  3 - 12 % Final  . Monocytes Absolute 10/21/2014 0.7  0.1 - 1.0 K/uL Final  . Eosinophils Relative 10/21/2014 3  0 - 5 % Final  . Eosinophils Absolute 10/21/2014 0.3  0.0 - 0.7 K/uL Final  . Basophils Relative 10/21/2014 1  0 - 1 % Final  . Basophils Absolute 10/21/2014 0.0  0.0 - 0.1 K/uL Final  . Sodium 10/21/2014 139  137 - 147 mEq/L Final  . Potassium 10/21/2014 4.2  3.7 - 5.3 mEq/L Final  . Chloride 10/21/2014 96  96 - 112 mEq/L Final  . CO2 10/21/2014 29  19 - 32 mEq/L Final  . Glucose, Bld 10/21/2014 106* 70 - 99 mg/dL Final  . BUN 10/21/2014 18  6 - 23 mg/dL Final  . Creatinine, Ser 10/21/2014 1.56* 0.50 -  1.35 mg/dL Final  . Calcium 10/21/2014 10.0  8.4 - 10.5 mg/dL Final  . Total Protein 10/21/2014 8.5* 6.0 - 8.3 g/dL Final  . Albumin 10/21/2014 4.4  3.5 - 5.2 g/dL Final  . AST 10/21/2014 30  0 - 37 U/L Final  . ALT 10/21/2014 19  0 - 53 U/L Final  . Alkaline Phosphatase 10/21/2014 102  39 - 117 U/L Final  . Total Bilirubin 10/21/2014 0.6  0.3 - 1.2 mg/dL Final  . GFR calc non Af Amer 10/21/2014 43* >90 mL/min Final  . GFR calc Af Amer 10/21/2014 50* >90 mL/min Final   Comment: (NOTE) The eGFR has been calculated using the CKD EPI equation. This calculation has not been validated in all clinical situations. eGFR's persistently <90 mL/min signify possible Chronic Kidney Disease.   . Anion gap 10/21/2014 14  5 - 15 Final  . LDH 10/21/2014 319* 94 - 250 U/L Final   SLIGHT HEMOLYSIS  . Beta-2 Microglobulin 10/21/2014 3.33* <=2.51 mg/L Final   Performed at [facility]  .  Total Protein 10/21/2014 7.6  6.0 - 8.3 g/dL Final  . Albumin ELP 10/21/2014 56.4  55.8 - 66.1 % Final  . Alpha-1-Globulin 10/21/2014 4.2  2.9 - 4.9 % Final  . Alpha-2-Globulin 10/21/2014 12.7* 7.1 - 11.8 % Final  . Beta Globulin 10/21/2014 6.2  4.7 - 7.2 % Final  . Beta 2 10/21/2014 6.9* 3.2 - 6.5 % Final  . Gamma Globulin 10/21/2014 13.6  11.1 - 18.8 % Final  . M-Spike, % 10/21/2014 NOT DETECTED   Final  . SPE Interp. 10/21/2014 (NOTE)   Final   Comment: Nonspecific pattern. Results are consistent with SPE performed on 08/30/2014    . Comment 10/21/2014 (NOTE)   Final   Comment: --------------- Serum protein electrophoresis is a useful screening procedure in the detection of various pathophysiologic states such as inflammation, gammopathies, protein loss and other dysproteinemias.  Immunofixation electrophoresis (IFE) is a more sensitive technique for the identification of M-proteins found in patients with monoclonal gammopathy of unknown significance (MGUS), amyloidosis, early or treated myeloma or  macroglobulinemia, solitary plasmacytoma or extramedullary plasmacytoma.   . IgG (Immunoglobin G), Serum 10/21/2014 1160  650 - 1600 mg/dL Final  . IgA 10/21/2014 265  68 - 379 mg/dL Final  . IgM, Serum 10/21/2014 47* 41 - 251 mg/dL Final  . Immunofix Electr Int 10/21/2014 (NOTE)   Final   Comment: No monoclonal protein identified. Reviewed by Francis Gaines Mammarappallil MD Paramedic on File) Performed at [facility]   . Kappa free light chain 10/21/2014 1.92  0.33 - 1.94 mg/dL Final  . Lamda free light chains 10/21/2014 0.48* 0.57 - 2.63 mg/dL Final  . Kappa, lamda light chain ratio 10/21/2014 4.00* 0.26 - 1.65 Final   Performed at [facility]  . TSH 10/21/2014 1.930  0.350 - 4.500 uIU/mL Final   Performed at [place]: No new pathology.  Urinalysis    Component Value Date/Time   COLORURINE YELLOW 07/18/2012 1112   APPEARANCEUR CLEAR 07/18/2012 1112   LABSPEC 1.023 07/18/2012 1112   PHURINE 5.5 07/18/2012 1112   GLUCOSEU NEGATIVE 07/18/2012 1112   HGBUR NEGATIVE 07/18/2012 1112   BILIRUBINUR NEGATIVE 07/18/2012 1112   KETONESUR NEGATIVE 07/18/2012 1112   PROTEINUR NEGATIVE 07/18/2012 1112   UROBILINOGEN 0.2 07/18/2012 1112   NITRITE NEGATIVE 07/18/2012 1112   LEUKOCYTESUR NEGATIVE 07/18/2012 1112    RADIOGRAPHIC STUDIES: Mr Abdomen W Wo Contrast  10/22/2014   CLINICAL DATA:  Evaluate complex cyst associated with the upper pole region of the left kidney.  EXAM: MRI ABDOMEN WITHOUT AND WITH CONTRAST  TECHNIQUE: Multiplanar multisequence MR imaging of the abdomen was performed both before and after the administration of intravenous contrast.  CONTRAST:  64mL MULTIHANCE GADOBENATE DIMEGLUMINE 529 MG/ML IV SOLN  COMPARISON:  Ultrasound examination 08/29/2014 and prior CT scan from 2007  FINDINGS: There are a numerous bilateral renal cysts. The largest lesion measures 2.5 cm in the upper pole region of the left kidney. It occupies a  defect in the parenchyma. It could potentially be a the large caliceal diverticulum as it may communicate with the collecting system. No worrisome MR imaging features such as nodularity or enhancement. The other renal lesions are simple cysts.  The liver is grossly normal. The spleen is normal in size. No focal lesions. The pancreas is unremarkable. The adrenal glands are normal.  The aorta and branch vessels are normal. The major venous structures are patent. No mesenteric or retroperitoneal mass or adenopathy. The  stomach, duodenum, small bowel and colon are grossly normal.  No significant bony findings.  IMPRESSION: Multiple bilateral renal cysts. The largest lesion on the left is filling a defect in the renal parenchyma and could be a large caliceal diverticulum. No worrisome renal mass lesions.   Electronically Signed   By: Kalman Jewels M.D.   On: 10/22/2014 18:27   Dg Bone Survey Met  10/21/2014   CLINICAL DATA:  Paraproteinemia with elevated kappa to lambda light chain ratio by blood work and history of chronic kidney disease.  EXAM: METASTATIC BONE SURVEY  COMPARISON:  Prior spine films and chest x-ray from 2013.  FINDINGS: No lytic or sclerotic bone lesions are identified in the imaged skeleton. No evidence of erosions or fractures. Spondylosis is present in the cervical, thoracic and lumbar spines. The most significant disc disease is at L5-S1.  Clips are present related to prior cholecystectomy. Mild atherosclerotic calcification is seen involving the abdominal aorta. Degenerative changes are seen involving both AC joints. Mild medial and lateral joint space narrowing present in both knees.  There is no evidence of pulmonary edema, consolidation, pneumothorax, nodule or pleural fluid. The heart size and mediastinal contours are normal. No soft tissue abnormalities identified. Visualized abdominal bowel gas is unremarkable.  IMPRESSION: No evidence of abnormal bony lesions by radiographic bone  survey.   Electronically Signed   By: Aletta Edouard M.D.   On: 10/21/2014 15:43    ASSESSMENT:  #1. Paraproteinemia manifesting as an elevated kappa to lambda light chain chain ratio of 5.07 in the setting of a normal 24 hour urine protein electrophoresis, repeat value 4.0 without evidence of lytic lesions on bone survey. #2. Left renal cyst on ultrasound. Diminished kidney size consistent with medical renal disease, MRI consistent with bilateral renal cysts. #3. Chronic kidney disease, stage III.   PLAN:  #1. No specific intervention recommended at this time. #2. Follow-up in 6 months with CBC, myeloma profile, light chains, 24-hour urine immunofixation.   All questions were answered. The patient knows to call the clinic with any problems, questions or concerns. We can certainly see the patient much sooner if necessary.   I spent 25 minutes counseling the patient face to face. The total time spent in the appointment was 30 minutes.    Doroteo Bradford, MD 10/28/2014 1:30 PM  DISCLAIMER:  This note was dictated with voice recognition software.  Similar sounding words can inadvertently be transcribed inaccurately and may not be corrected upon review.

## 2014-10-28 NOTE — Patient Instructions (Signed)
[facility] Discharge Instructions  RECOMMENDATIONS MADE BY THE CONSULTANT AND ANY TEST RESULTS WILL BE SENT TO YOUR REFERRING PHYSICIAN.  EXAM FINDINGS BY THE PHYSICIAN TODAY AND SIGNS OR SYMPTOMS TO REPORT TO CLINIC OR PRIMARY PHYSICIAN: Exam and findings as discussed by Dr. Barnet Glasgow.  You have a condition called MGUS  Monoclonal Gammopathy  and you do not need any treatment at present.  Report night sweats, unexplained weight loss or other concerns.    INSTRUCTIONS/FOLLOW-UP: 6 months with 24 hour urine collection, lab work and office visit.  Thank you for choosing [facility] to provide your oncology and hematology care.  To afford each patient quality time with our providers, please arrive at least 15 minutes before your scheduled appointment time.  With your help, our goal is to use those 15 minutes to complete the necessary work-up to ensure our physicians have the information they need to help with your evaluation and healthcare recommendations.    Effective January 1st, 2014, we ask that you re-schedule your appointment with our physicians should you arrive 10 or more minutes late for your appointment.  We strive to give you quality time with our providers, and arriving late affects you and other patients whose appointments are after yours.    Again, thank you for choosing [facility].  Our hope is that these requests will decrease the amount of time that you wait before being seen by our physicians.       _____________________________________________________________  Should you have questions after your visit to [facility], please contact our office at [phone] between the hours of 8:30 a.m. and 4:30 p.m.  Voicemails left after 4:30 p.m. will not be returned until the following business day.  For prescription refill requests, have your pharmacy contact our office with your prescription refill request.     _______________________________________________________________  We hope that we have given you very good care.  You may receive a patient satisfaction survey in the mail, please complete it and return it as soon as possible.  We value your feedback!  _______________________________________________________________  Have you asked about our STAR program?  STAR stands for Survivorship Training and Rehabilitation, and this is a nationally recognized cancer care program that focuses on survivorship and rehabilitation.  Cancer and cancer treatments may cause problems, such as, pain, making you feel tired and keeping you from doing the things that you need or want to do. Cancer rehabilitation can help. Our goal is to reduce these troubling effects and help you have the best quality of life possible.  You may receive a survey from a nurse that asks questions about your current state of health.  Based on the survey results, all eligible patients will be referred to the [facility] program for an evaluation so we can better serve you!  A frequently asked questions sheet is available upon request.  24-Hour Urine Collection HOME CARE  When you get up in the morning on the day you do this test, pee (urinate) in the toilet and flush. Make a note of the time. This will be your start time on the day of collection and the end time on the next morning.  From then on, save all your pee (urine) in the plastic jug that was given to you.  You should stop collecting your pee 24 hours after you started.  If the plastic jug that is given to you already has liquid in it, that is okay.  Do not throw out the liquid or rinse out the jug. Some tests need the liquid to be added to your pee.  Keep your plastic jug cool (in an ice chest or the refrigerator) during the test.  When the 24 hours is over, bring your plastic jug to the clinic lab. Keep the jug cool (in an ice chest) while you are bringing it to the lab. Document  Released: 02/04/2009 Document Revised: 01/31/2012 Document Reviewed: 02/04/2009 [employer] Patient Information 2015 [place], [place]. This information is not intended to replace advice given to you by your health care provider. Make sure you discuss any questions you have with your health care provider.

## 2014-10-29 ENCOUNTER — Other Ambulatory Visit (HOSPITAL_COMMUNITY): Payer: Self-pay

## 2014-10-29 DIAGNOSIS — D472 Monoclonal gammopathy: Secondary | ICD-10-CM

## 2014-11-28 DIAGNOSIS — E119 Type 2 diabetes mellitus without complications: Secondary | ICD-10-CM | POA: Diagnosis not present

## 2014-11-28 DIAGNOSIS — I10 Essential (primary) hypertension: Secondary | ICD-10-CM | POA: Diagnosis not present

## 2014-12-04 DIAGNOSIS — E119 Type 2 diabetes mellitus without complications: Secondary | ICD-10-CM | POA: Diagnosis not present

## 2014-12-04 DIAGNOSIS — M10379 Gout due to renal impairment, unspecified ankle and foot: Secondary | ICD-10-CM | POA: Diagnosis not present

## 2014-12-04 DIAGNOSIS — E782 Mixed hyperlipidemia: Secondary | ICD-10-CM | POA: Diagnosis not present

## 2014-12-04 DIAGNOSIS — I10 Essential (primary) hypertension: Secondary | ICD-10-CM | POA: Diagnosis not present

## 2014-12-04 DIAGNOSIS — E1122 Type 2 diabetes mellitus with diabetic chronic kidney disease: Secondary | ICD-10-CM | POA: Diagnosis not present

## 2014-12-11 DIAGNOSIS — I10 Essential (primary) hypertension: Secondary | ICD-10-CM | POA: Diagnosis not present

## 2014-12-11 DIAGNOSIS — N183 Chronic kidney disease, stage 3 (moderate): Secondary | ICD-10-CM | POA: Diagnosis not present

## 2015-02-11 DIAGNOSIS — D3131 Benign neoplasm of right choroid: Secondary | ICD-10-CM | POA: Diagnosis not present

## 2015-02-27 DIAGNOSIS — Z8701 Personal history of pneumonia (recurrent): Secondary | ICD-10-CM

## 2015-02-27 DIAGNOSIS — J069 Acute upper respiratory infection, unspecified: Secondary | ICD-10-CM | POA: Diagnosis not present

## 2015-02-27 DIAGNOSIS — J40 Bronchitis, not specified as acute or chronic: Secondary | ICD-10-CM | POA: Diagnosis not present

## 2015-02-27 DIAGNOSIS — J329 Chronic sinusitis, unspecified: Secondary | ICD-10-CM | POA: Diagnosis not present

## 2015-02-27 HISTORY — DX: Personal history of pneumonia (recurrent): Z87.01

## 2015-02-28 DIAGNOSIS — R938 Abnormal findings on diagnostic imaging of other specified body structures: Secondary | ICD-10-CM | POA: Diagnosis not present

## 2015-03-03 ENCOUNTER — Other Ambulatory Visit (HOSPITAL_COMMUNITY): Payer: Self-pay | Admitting: Preventative Medicine

## 2015-03-03 DIAGNOSIS — R9389 Abnormal findings on diagnostic imaging of other specified body structures: Secondary | ICD-10-CM

## 2015-03-04 DIAGNOSIS — J0100 Acute maxillary sinusitis, unspecified: Secondary | ICD-10-CM | POA: Diagnosis not present

## 2015-03-04 DIAGNOSIS — J40 Bronchitis, not specified as acute or chronic: Secondary | ICD-10-CM | POA: Diagnosis not present

## 2015-03-04 DIAGNOSIS — R222 Localized swelling, mass and lump, trunk: Secondary | ICD-10-CM | POA: Diagnosis not present

## 2015-03-07 ENCOUNTER — Ambulatory Visit (HOSPITAL_COMMUNITY)
Admission: RE | Admit: 2015-03-07 | Discharge: 2015-03-07 | Disposition: A | Discharge status: Home / Self Care | Payer: Medicare Other | Source: Ambulatory Visit | Attending: Preventative Medicine | Admitting: Preventative Medicine

## 2015-03-07 DIAGNOSIS — R938 Abnormal findings on diagnostic imaging of other specified body structures: Secondary | ICD-10-CM | POA: Insufficient documentation

## 2015-03-07 DIAGNOSIS — R9389 Abnormal findings on diagnostic imaging of other specified body structures: Secondary | ICD-10-CM

## 2015-03-07 DIAGNOSIS — Z0389 Encounter for observation for other suspected diseases and conditions ruled out: Secondary | ICD-10-CM | POA: Diagnosis not present

## 2015-03-18 DIAGNOSIS — I10 Essential (primary) hypertension: Secondary | ICD-10-CM | POA: Diagnosis not present

## 2015-03-18 DIAGNOSIS — N183 Chronic kidney disease, stage 3 (moderate): Secondary | ICD-10-CM | POA: Diagnosis not present

## 2015-03-18 DIAGNOSIS — E559 Vitamin D deficiency, unspecified: Secondary | ICD-10-CM | POA: Diagnosis not present

## 2015-03-18 DIAGNOSIS — E782 Mixed hyperlipidemia: Secondary | ICD-10-CM | POA: Diagnosis not present

## 2015-03-18 DIAGNOSIS — R809 Proteinuria, unspecified: Secondary | ICD-10-CM | POA: Diagnosis not present

## 2015-03-18 DIAGNOSIS — E1122 Type 2 diabetes mellitus with diabetic chronic kidney disease: Secondary | ICD-10-CM | POA: Diagnosis not present

## 2015-03-18 DIAGNOSIS — Z79899 Other long term (current) drug therapy: Secondary | ICD-10-CM | POA: Diagnosis not present

## 2015-03-18 DIAGNOSIS — D649 Anemia, unspecified: Secondary | ICD-10-CM | POA: Diagnosis not present

## 2015-03-26 DIAGNOSIS — E559 Vitamin D deficiency, unspecified: Secondary | ICD-10-CM | POA: Diagnosis not present

## 2015-03-26 DIAGNOSIS — N183 Chronic kidney disease, stage 3 (moderate): Secondary | ICD-10-CM | POA: Diagnosis not present

## 2015-03-26 DIAGNOSIS — I10 Essential (primary) hypertension: Secondary | ICD-10-CM | POA: Diagnosis not present

## 2015-04-22 ENCOUNTER — Other Ambulatory Visit (HOSPITAL_COMMUNITY): Payer: Medicare Other

## 2015-04-23 ENCOUNTER — Encounter (HOSPITAL_COMMUNITY): Payer: Medicare Other | Attending: Oncology

## 2015-04-23 DIAGNOSIS — D892 Hypergammaglobulinemia, unspecified: Secondary | ICD-10-CM | POA: Diagnosis present

## 2015-04-23 DIAGNOSIS — I10 Essential (primary) hypertension: Secondary | ICD-10-CM | POA: Diagnosis not present

## 2015-04-23 DIAGNOSIS — N183 Chronic kidney disease, stage 3 (moderate): Secondary | ICD-10-CM

## 2015-04-23 DIAGNOSIS — N281 Cyst of kidney, acquired: Secondary | ICD-10-CM

## 2015-04-23 DIAGNOSIS — R7301 Impaired fasting glucose: Secondary | ICD-10-CM | POA: Diagnosis not present

## 2015-04-23 DIAGNOSIS — D472 Monoclonal gammopathy: Secondary | ICD-10-CM | POA: Insufficient documentation

## 2015-04-23 DIAGNOSIS — E785 Hyperlipidemia, unspecified: Secondary | ICD-10-CM | POA: Diagnosis not present

## 2015-04-23 DIAGNOSIS — E559 Vitamin D deficiency, unspecified: Secondary | ICD-10-CM | POA: Diagnosis not present

## 2015-04-23 LAB — COMPREHENSIVE METABOLIC PANEL
ALK PHOS: 76 U/L (ref 38–126)
ALT: 21 U/L (ref 17–63)
AST: 28 U/L (ref 15–41)
Albumin: 4.5 g/dL (ref 3.5–5.0)
Anion gap: 10 (ref 5–15)
BILIRUBIN TOTAL: 0.7 mg/dL (ref 0.3–1.2)
BUN: 17 mg/dL (ref 6–20)
CHLORIDE: 102 mmol/L (ref 101–111)
CO2: 28 mmol/L (ref 22–32)
CREATININE: 1.63 mg/dL — AB (ref 0.61–1.24)
Calcium: 9.5 mg/dL (ref 8.9–10.3)
GFR calc non Af Amer: 40 mL/min — ABNORMAL LOW (ref >60)
GFR, EST AFRICAN AMERICAN: 47 mL/min — AB (ref >60)
GLUCOSE: 111 mg/dL — AB (ref 65–99)
POTASSIUM: 3.7 mmol/L (ref 3.5–5.1)
SODIUM: 140 mmol/L (ref 135–145)
Total Protein: 8 g/dL (ref 6.5–8.1)

## 2015-04-23 LAB — CBC WITH DIFFERENTIAL/PLATELET
Basophils Absolute: 0.1 10*3/uL (ref 0.0–0.1)
Basophils Relative: 1 % (ref 0–1)
Eosinophils Absolute: 0.9 10*3/uL — ABNORMAL HIGH (ref 0.0–0.7)
Eosinophils Relative: 11 % — ABNORMAL HIGH (ref 0–5)
HCT: 44.4 % (ref 39.0–52.0)
Hemoglobin: 15 g/dL (ref 13.0–17.0)
LYMPHS ABS: 2.8 10*3/uL (ref 0.7–4.0)
Lymphocytes Relative: 33 % (ref 12–46)
MCH: 34.3 pg — AB (ref 26.0–34.0)
MCHC: 33.8 g/dL (ref 30.0–36.0)
MCV: 101.6 fL — ABNORMAL HIGH (ref 78.0–100.0)
MONO ABS: 0.7 10*3/uL (ref 0.1–1.0)
MONOS PCT: 8 % (ref 3–12)
Neutro Abs: 4 10*3/uL (ref 1.7–7.7)
Neutrophils Relative %: 47 % (ref 43–77)
Platelets: 327 10*3/uL (ref 150–400)
RBC: 4.37 MIL/uL (ref 4.22–5.81)
RDW: 14.2 % (ref 11.5–15.5)
WBC: 8.4 10*3/uL (ref 4.0–10.5)

## 2015-04-23 NOTE — Progress Notes (Signed)
Labs drawn

## 2015-04-24 LAB — KAPPA/LAMBDA LIGHT CHAINS
Kappa free light chain: 26.64 mg/L — ABNORMAL HIGH (ref 3.30–19.40)
Kappa, lambda light chain ratio: 2.67 — ABNORMAL HIGH (ref 0.26–1.65)
Lambda free light chains: 9.97 mg/L (ref 5.71–26.30)

## 2015-04-24 LAB — UIFE/LIGHT CHAINS/TP QN, 24-HR UR
% BETA, URINE: 0 %
ALPHA 1 URINE: 0 %
Albumin, U: 100 %
Alpha 2, Urine: 0 %
FREE KAPPA/LAMBDA RATIO: 15.91 — AB (ref 2.04–10.37)
Free Lambda Lt Chains,Ur: 0.45 mg/L (ref 0.24–6.66)
Free Lt Chn Excr Rate: 7.16 mg/L (ref 1.35–24.19)
GAMMA GLOBULIN URINE: 0 %
Time: 24 hours
Total Protein, Urine-Ur/day: 112 mg/24 hr (ref 30.0–150.0)
Total Protein, Urine: 4 mg/dL
VOLUME, URINE-UPE24: 2700 mL

## 2015-04-28 LAB — MULTIPLE MYELOMA PANEL, SERUM
ALPHA 1: 0.3 g/dL (ref 0.1–0.4)
ALPHA2 GLOB SERPL ELPH-MCNC: 0.8 g/dL (ref 0.4–1.2)
Albumin SerPl Elph-Mcnc: 4 g/dL (ref 3.2–5.6)
Albumin/Glob SerPl: 1.3 (ref 0.7–2.0)
B-GLOBULIN SERPL ELPH-MCNC: 1.2 g/dL (ref 0.6–1.3)
GAMMA GLOB SERPL ELPH-MCNC: 1 g/dL (ref 0.5–1.6)
Globulin, Total: 3.3 g/dL (ref 2.0–4.5)
IgA: 203 mg/dL (ref 61–437)
IgG (Immunoglobin G), Serum: 1156 mg/dL (ref 700–1600)
IgM, Serum: 49 mg/dL (ref 15–143)
Total Protein ELP: 7.3 g/dL (ref 6.0–8.5)

## 2015-04-29 ENCOUNTER — Encounter (HOSPITAL_BASED_OUTPATIENT_CLINIC_OR_DEPARTMENT_OTHER): Payer: Medicare Other | Admitting: Hematology & Oncology

## 2015-04-29 ENCOUNTER — Encounter (HOSPITAL_COMMUNITY): Payer: Self-pay | Admitting: Hematology & Oncology

## 2015-04-29 ENCOUNTER — Ambulatory Visit (HOSPITAL_COMMUNITY): Payer: Medicare Other | Admitting: Hematology & Oncology

## 2015-04-29 VITALS — BP 140/75 | HR 67 | Temp 97.7°F | Resp 16 | Wt 214.7 lb

## 2015-04-29 DIAGNOSIS — N183 Chronic kidney disease, stage 3 (moderate): Secondary | ICD-10-CM | POA: Diagnosis not present

## 2015-04-29 DIAGNOSIS — D472 Monoclonal gammopathy: Secondary | ICD-10-CM

## 2015-04-29 NOTE — Progress Notes (Signed)
[facility] [place] NOTE  Patient Care Team: Delphina Cahill, MD as PCP - General (Internal Medicine) Daneil Dolin, MD as Attending Physician (Gastroenterology) Fran Lowes, MD as Consulting Physician (Nephrology) Franchot Gallo, MD as Consulting Physician (Urology)  CHIEF COMPLAINTS/PURPOSE OF CONSULTATION:  MGUS Elevated kappa/lambda ratio of 5.07 Stage III CKD Normal 24 hour urine protein electrophoresis Bone survey on 09/2014 WNL  HISTORY OF PRESENTING ILLNESS:  [name] 74 y.o. male is here because of MGUS.  He feels well. He has a good appetite. He notes seasonal allergies that worsen from year to year and occur in the summer. He states he had allergy testing once and was told he was allergic to mold. He denies a prior history of asthma or atopy.  He follows with Dr. Diona Fanti chronically because they found something on his kidney. He underwent MRI on 10/22/2014 that showed multiple bilateral renal cysts.  MEDICAL HISTORY:  Past Medical History  Diagnosis Date  . Hypertension   . Myocardial infarction     hx of abnormal ekg showed prior myocardial infarction  . Hyperlipidemia   . Asthma   . Hypothyroidism   . GERD (gastroesophageal reflux disease)     occasional  . Blood transfusion abn reaction or complication, no procedure mishap 1979    reaction due to wrong blood type given  . Renal disorder   . H/O: pneumonia 02/27/2015    SURGICAL HISTORY: Past Surgical History  Procedure Laterality Date  . Tonsillectomy  1962  . Appendectomy  1963  . Cholecystectomy  1979  . Stomach surgery  2003    exploratory surgery, fatty tumors with obstruction?  . Hernia repair  2005  . Lumbar laminectomy/decompression microdiscectomy  07/26/2012    Procedure: LUMBAR LAMINECTOMY/DECOMPRESSION MICRODISCECTOMY;  Surgeon: Johnn Hai, MD;  Location: [facility];  Service: Orthopedics;  Laterality: N/A;  Lumbar Decompression L4-L5  . Colonoscopy   03/28/2008    CZY:SAYTKZ rectum; left-sided transverse diverticula diminutive polyp; descending colon. Adenomas.  . Colonoscopy N/A 06/07/2013    Procedure: COLONOSCOPY;  Surgeon: Daneil Dolin, MD;  Location: [facility];  Service: Endoscopy;  Laterality: N/A;  9:45    SOCIAL HISTORY: History   Social History  . Marital Status: Married    Spouse Name: N/A  . Number of Children: N/A  . Years of Education: N/A   Occupational History  . Not on file.   Social History Main Topics  . Smoking status: Never Smoker   . Smokeless tobacco: Never Used  . Alcohol Use: Yes     Comment: occasional  . Drug Use: No  . Sexual Activity: No   Other Topics Concern  . 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 His son is also a Emergency planning/management officer.  FAMILY HISTORY: Family History  Problem Relation Age of Onset  . Colon cancer Neg Hx   . Thyroid disease Mother   . Heart disease Mother   . Hypertension Mother   . Clotting disorder Father   . Hypertension Father   . Cancer Sister   . Hypertension Sister    has no family status information on file.   Mother deceased at 59 yo from heart attack. 5 siblings.  ALLERGIES:  is allergic to meperidine and related and sulfa antibiotics.  MEDICATIONS:  Current Outpatient Prescriptions  Medication Sig Dispense Refill  . acetaminophen (TYLENOL) 500 MG  tablet Take 500 mg by mouth every 6 (six) hours as needed. Takes 2 tabs only as needed.    Marland Kitchen ADVAIR DISKUS 100-50 MCG/DOSE AEPB Inhale 1 Dose into the lungs as needed.  2  . albuterol (PROVENTIL) (2.5 MG/3ML) 0.083% nebulizer solution Take 2.5 mg by nebulization every 6 (six) hours as needed. Wheezing and shortness of breath    . amLODipine (NORVASC) 10 MG tablet Take 10 mg by mouth daily.    Marland Kitchen aspirin EC 81 MG tablet Take 81 mg by mouth daily.    . cholecalciferol (VITAMIN D) 1000 UNITS tablet Take 1,000 Units by mouth daily.    .  furosemide (LASIX) 40 MG tablet Take 40 mg by mouth every other day.  0  . levalbuterol (XOPENEX) 1.25 MG/3ML nebulizer solution Inhale 1.25 mg into the lungs 3 (three) times daily as needed.    Marland Kitchen levothyroxine (SYNTHROID, LEVOTHROID) 75 MCG tablet Take 75 mcg by mouth at bedtime.    . ondansetron (ZOFRAN) 4 MG tablet Take 1 tablet (4 mg total) by mouth every 6 (six) hours. 20 tablet 0  . simvastatin (ZOCOR) 40 MG tablet Take 40 mg by mouth at bedtime.    . tamsulosin (FLOMAX) 0.4 MG CAPS capsule Take 0.4 mg by mouth daily.    Marland Kitchen triamterene-hydrochlorothiazide (DYAZIDE) 37.5-25 MG per capsule Take 1 capsule by mouth every other day. Every other day    . vitamin B-12 (CYANOCOBALAMIN) 1000 MCG tablet Take 1,000 mcg by mouth daily.     No current facility-administered medications for this visit.    Review of Systems  Constitutional: Negative for fever, chills, weight loss and malaise/fatigue.  HENT: Negative for congestion, hearing loss, nosebleeds, sore throat and tinnitus.   Eyes: Negative for blurred vision, double vision, pain and discharge.  Respiratory: Positive for wheezing. Negative for cough, hemoptysis, sputum production and shortness of breath.        Seasonal allergies, alleviated with breathing treatment.  Cardiovascular: Negative for chest pain, palpitations, claudication, leg swelling and PND.  Gastrointestinal: Negative for heartburn, nausea, vomiting, abdominal pain, diarrhea, constipation, blood in stool and melena.  Genitourinary: Negative for dysuria, urgency, frequency and hematuria.  Musculoskeletal: Negative for myalgias, joint pain and falls.  Skin: Negative for itching and rash.  Neurological: Negative for dizziness, tingling, tremors, sensory change, speech change, focal weakness, seizures, loss of consciousness, weakness and headaches.  Endo/Heme/Allergies: Does not bruise/bleed easily.  Psychiatric/Behavioral: Negative for depression, suicidal ideas, memory loss and  substance abuse. The patient is not nervous/anxious and does not have insomnia.    14 point ROS was done and is otherwise as detailed above or in HPI  PHYSICAL EXAMINATION:  ECOG PERFORMANCE STATUS: 0 - Asymptomatic  Filed Vitals:   04/29/15 1047  BP: 140/75  Pulse: 67  Temp: 97.7 F (36.5 C)  Resp: 16   Filed Weights   04/29/15 1047  Weight: 214 lb 11.2 oz (97.387 kg)    Physical Exam  Constitutional: He is oriented to person, place, and time and well-developed, well-nourished, and in no distress.  HENT:  Head: Normocephalic and atraumatic.  Nose: Nose normal.  Mouth/Throat: Oropharynx is clear and moist. No oropharyngeal exudate.  Eyes: Conjunctivae and EOM are normal. Pupils are equal, round, and reactive to light. Right eye exhibits no discharge. Left eye exhibits no discharge. No scleral icterus.  Neck: Normal range of motion. Neck supple. No tracheal deviation present. No thyromegaly present.  Cardiovascular: Normal rate, regular rhythm and normal heart sounds.  Exam  reveals no gallop and no friction rub.   No murmur heard. Pulmonary/Chest: Effort normal. He has wheezes. He has no rales.  Rare R side wheezing.  Abdominal: Soft. Bowel sounds are normal. He exhibits no distension and no mass. There is no tenderness. There is no rebound and no guarding.  Musculoskeletal: Normal range of motion. He exhibits no edema.  Lymphadenopathy:    He has no cervical adenopathy.  Neurological: He is alert and oriented to person, place, and time. He has normal reflexes. No cranial nerve deficit. Gait normal. Coordination normal.  Skin: Skin is warm and dry. No rash noted.  Psychiatric: Mood, memory, affect and judgment normal.  Nursing note and vitals reviewed.    LABORATORY DATA:  I have reviewed the data as listed Results for [name], [name] (MRN [id number]) as of 05/01/2015 19:55  Ref. Range 04/23/2015 12:24 04/23/2015 12:33  Sodium Latest Ref Range: 135-145 mmol/L 140   Potassium  Latest Ref Range: 3.5-5.1 mmol/L 3.7   Chloride Latest Ref Range: 101-111 mmol/L 102   CO2 Latest Ref Range: 22-32 mmol/L 28   BUN Latest Ref Range: 6-20 mg/dL 17   Creatinine Latest Ref Range: 0.61-1.24 mg/dL 1.63 (H)   Calcium Latest Ref Range: 8.9-10.3 mg/dL 9.5   EGFR (Non-African Amer.) Latest Ref Range: >60 mL/min 40 (L)   EGFR (African American) Latest Ref Range: >60 mL/min 47 (L)   Glucose Latest Ref Range: 65-99 mg/dL 111 (H)   Anion gap Latest Ref Range: 5-15  10   Alkaline Phosphatase Latest Ref Range: 38-126 U/L 76   Albumin Latest Ref Range: 3.5-5.0 g/dL 4.5   AST Latest Ref Range: 15-41 U/L 28   ALT Latest Ref Range: 17-63 U/L 21   Total Protein Latest Ref Range: 6.5-8.1 g/dL 8.0   Total Bilirubin Latest Ref Range: 0.3-1.2 mg/dL 0.7   Total Protein ELP Latest Ref Range: 6.0-8.5 g/dL  7.3  Albumin SerPl Elph-Mcnc Latest Ref Range: 3.2-5.6 g/dL  4.0  Albumin/Glob SerPl Latest Ref Range: 0.7-2.0   1.3  Alpha2 Glob SerPl Elph-Mcnc Latest Ref Range: 0.4-1.2 g/dL  0.8  Alpha 1 Latest Ref Range: 0.1-0.4 g/dL  0.3  Gamma Glob SerPl Elph-Mcnc Latest Ref Range: 0.5-1.6 g/dL  1.0  IFE 1 Unknown  Comment  % Beta Latest Units: % 0.0   Globulin, Total Latest Ref Range: 2.0-4.5 g/dL  3.3  B-Globulin SerPl Elph-Mcnc Latest Ref Range: 0.6-1.3 g/dL  1.2  M-SPIKE, % Latest Ref Range: Not Observed % Not Observed   IgG (Immunoglobin G), Serum Latest Ref Range: [phone] mg/dL  1156  IgA Latest Ref Range: 61-437 mg/dL  203  IgM, Serum Latest Ref Range: 15-143 mg/dL  49  Kappa free light chain Latest Ref Range: 3.30-19.40 mg/L 26.64 (H)   Lamda free light chains Latest Ref Range: 5.71-26.30 mg/L 9.97   Kappa, lamda light chain ratio Latest Ref Range: 0.26-1.65  2.67 (H)   M Protein SerPl Elph-Mcnc Latest Ref Range: Not Observed g/dL  Not Observed  WBC Latest Ref Range: 4.0-10.5 K/uL 8.4   RBC Latest Ref Range: 4.22-5.81 MIL/uL 4.37   Hemoglobin Latest Ref Range: 13.0-17.0 g/dL 15.0   HCT  Latest Ref Range: 39.0-52.0 % 44.4   MCV Latest Ref Range: 78.0-100.0 fL 101.6 (H)   MCH Latest Ref Range: 26.0-34.0 pg 34.3 (H)   MCHC Latest Ref Range: 30.0-36.0 g/dL 33.8   RDW Latest Ref Range: 11.5-15.5 % 14.2   Platelets Latest Ref Range: 150-400 K/uL 327   Neutrophils  Latest Ref Range: 43-77 % 47   Lymphocytes Latest Ref Range: 12-46 % 33   Monocytes Relative Latest Ref Range: 3-12 % 8   Eosinophil Latest Ref Range: 0-5 % 11 (H)   Basophil Latest Ref Range: 0-1 % 1   NEUT# Latest Ref Range: 1.7-7.7 K/uL 4.0   Lymphocyte # Latest Ref Range: 0.7-4.0 K/uL 2.8   Monocyte # Latest Ref Range: 0.1-1.0 K/uL 0.7   Eosinophils Absolute Latest Ref Range: 0.0-0.7 K/uL 0.9 (H)   Basophils Absolute Latest Ref Range: 0.0-0.1 K/uL 0.1   Please Note (HCV): Unknown  Comment  NOTE: Unknown Comment   Time-UPE24 Latest Units: hours 24   Volume, Urine-UPE24 Latest Units: mL 2700   Total Protein, Urine-UPE24 Latest Ref Range: Not Estab. mg/dL 4.0   Total Protein, Urine-Ur/day Latest Ref Range: 30.0-150.0 mg/24 hr 112.0   ALBUMIN, U Latest Units: % 100.0   Alpha 2, Urine Latest Units: % 0.0   Free Lt Chn Excr Rate Latest Ref Range: 1.35-24.19 mg/L 7.16   Free Lambda Lt Chains,Ur Latest Ref Range: 0.24-6.66 mg/L 0.45   Free Kappa/Lambda Ratio Latest Ref Range: 2.04-10.37  15.91 (H)   ALPHA 1 URINE Latest Units: % 0.0   GAMMA GLOBULIN URINE Latest Units: % 0.0   Immunofixation Result, Urine Unknown Comment      ASSESSMENT & PLAN:  MGUS Elevated kappa/lambda ratio of 5.07 Stage III CKD Bone survey on 09/2014 WNL  He has not undergone a BMBX and we addressed this briefly today. Other than his kidney disease which is stable, his blood counts are preserved, calcium is WNL and last myeloma survey was normal.  He will follow up in 6 months. Will have labs and Xrays done a few days before the follow up so we can review results at his visit. We can discuss whether or not to proceed with marrow pending  the results of his next labs.  Orders Placed This Encounter  Procedures  . DG Bone Survey Met    Standing Status: Future     Number of Occurrences:      Standing Expiration Date: 06/28/2016    Order Specific Question:  Reason for Exam (SYMPTOM  OR DIAGNOSIS REQUIRED)    Answer:  MGUS    Order Specific Question:  Preferred imaging location?    Answer:  [facility]  . CBC with Differential    Standing Status: Future     Number of Occurrences:      Standing Expiration Date: 04/28/2016  . Comprehensive metabolic panel    Standing Status: Future     Number of Occurrences:      Standing Expiration Date: 04/28/2016  . Kappa/lambda light chains    Standing Status: Future     Number of Occurrences:      Standing Expiration Date: 04/28/2016  . IgG, IgA, IgM    Standing Status: Future     Number of Occurrences:      Standing Expiration Date: 04/28/2016  . Immunofixation electrophoresis    Standing Status: Future     Number of Occurrences:      Standing Expiration Date: 04/28/2016  . Protein electrophoresis, serum    Standing Status: Future     Number of Occurrences:      Standing Expiration Date: 04/28/2016  . Protein electrophoresis, urine    Standing Status: Future     Number of Occurrences:      Standing Expiration Date: 04/28/2016    All questions were answered. The patient  knows to call the clinic with any problems, questions or concerns.  This document serves as a record of services personally performed by Ancil Linsey, MD. It was created on her behalf by Arlyce Harman, a trained medical scribe. The creation of this record is based on the scribe's personal observations and the provider's statements to them. This document has been checked and approved by the attending provider.  I have reviewed the above documentation for accuracy and completeness, and I agree with the above.  This note was electronically signed.    Molli Hazard, MD

## 2015-04-29 NOTE — Patient Instructions (Signed)
[place] at [facility] Discharge Instructions  RECOMMENDATIONS MADE BY THE CONSULTANT AND ANY TEST RESULTS WILL BE SENT TO YOUR REFERRING PHYSICIAN.  Exam and discussion by Dr. Gustavus Bryant are stable Need to do a Bone survey before your next visit.  Do not need an appointment to do this. Report uncontrolled pain, night sweats, unexplained weight loss, etc. Follow-up in 6 months with labs and office visit.  Thank you for choosing [place] at [facility] to provide your oncology and hematology care.  To afford each patient quality time with our provider, please arrive at least 15 minutes before your scheduled appointment time.    You need to re-schedule your appointment should you arrive 10 or more minutes late.  We strive to give you quality time with our providers, and arriving late affects you and other patients whose appointments are after yours.  Also, if you no show three or more times for appointments you may be dismissed from the clinic at the providers discretion.     Again, thank you for choosing [facility].  Our hope is that these requests will decrease the amount of time that you wait before being seen by our physicians.       _____________________________________________________________  Should you have questions after your visit to [facility], please contact our office at [phone] between the hours of 8:30 a.m. and 4:30 p.m.  Voicemails left after 4:30 p.m. will not be returned until the following business day.  For prescription refill requests, have your pharmacy contact our office.

## 2015-05-01 ENCOUNTER — Encounter (HOSPITAL_COMMUNITY): Payer: Self-pay | Admitting: Hematology & Oncology

## 2015-05-20 ENCOUNTER — Ambulatory Visit (INDEPENDENT_AMBULATORY_CARE_PROVIDER_SITE_OTHER): Payer: Medicare Other | Admitting: Urology

## 2015-05-20 DIAGNOSIS — N529 Male erectile dysfunction, unspecified: Secondary | ICD-10-CM

## 2015-05-20 DIAGNOSIS — R972 Elevated prostate specific antigen [PSA]: Secondary | ICD-10-CM

## 2015-07-21 DIAGNOSIS — N183 Chronic kidney disease, stage 3 (moderate): Secondary | ICD-10-CM | POA: Diagnosis not present

## 2015-07-21 DIAGNOSIS — R809 Proteinuria, unspecified: Secondary | ICD-10-CM | POA: Diagnosis not present

## 2015-07-21 DIAGNOSIS — Z79899 Other long term (current) drug therapy: Secondary | ICD-10-CM | POA: Diagnosis not present

## 2015-07-21 DIAGNOSIS — D649 Anemia, unspecified: Secondary | ICD-10-CM | POA: Diagnosis not present

## 2015-07-21 DIAGNOSIS — I10 Essential (primary) hypertension: Secondary | ICD-10-CM | POA: Diagnosis not present

## 2015-07-21 DIAGNOSIS — E559 Vitamin D deficiency, unspecified: Secondary | ICD-10-CM | POA: Diagnosis not present

## 2015-07-30 DIAGNOSIS — N183 Chronic kidney disease, stage 3 (moderate): Secondary | ICD-10-CM | POA: Diagnosis not present

## 2015-07-30 DIAGNOSIS — E559 Vitamin D deficiency, unspecified: Secondary | ICD-10-CM | POA: Diagnosis not present

## 2015-07-30 DIAGNOSIS — E669 Obesity, unspecified: Secondary | ICD-10-CM | POA: Diagnosis not present

## 2015-07-30 DIAGNOSIS — N2581 Secondary hyperparathyroidism of renal origin: Secondary | ICD-10-CM | POA: Diagnosis not present

## 2015-10-21 ENCOUNTER — Ambulatory Visit: Payer: BC Managed Care – PPO | Admitting: Urology

## 2015-10-24 ENCOUNTER — Other Ambulatory Visit (HOSPITAL_COMMUNITY): Payer: BC Managed Care – PPO

## 2015-10-29 ENCOUNTER — Ambulatory Visit (HOSPITAL_COMMUNITY): Payer: BC Managed Care – PPO | Admitting: Hematology & Oncology

## 2015-11-03 ENCOUNTER — Encounter (HOSPITAL_COMMUNITY): Payer: Self-pay

## 2015-11-11 DIAGNOSIS — J069 Acute upper respiratory infection, unspecified: Secondary | ICD-10-CM | POA: Diagnosis not present

## 2015-11-11 DIAGNOSIS — J40 Bronchitis, not specified as acute or chronic: Secondary | ICD-10-CM | POA: Diagnosis not present

## 2015-11-18 NOTE — Progress Notes (Signed)
This encounter was created in error - please disregard.

## 2015-12-03 ENCOUNTER — Encounter (HOSPITAL_COMMUNITY): Payer: Self-pay | Admitting: Hematology & Oncology

## 2015-12-03 ENCOUNTER — Other Ambulatory Visit (HOSPITAL_COMMUNITY)
Admission: RE | Admit: 2015-12-03 | Discharge: 2015-12-03 | Disposition: A | Discharge status: Home / Self Care | Payer: Medicare Other | Attending: Nephrology | Admitting: Nephrology

## 2015-12-03 ENCOUNTER — Ambulatory Visit (HOSPITAL_COMMUNITY)
Admission: RE | Admit: 2015-12-03 | Discharge: 2015-12-03 | Disposition: A | Discharge status: Home / Self Care | Payer: Medicare Other | Source: Ambulatory Visit | Attending: Hematology & Oncology | Admitting: Hematology & Oncology

## 2015-12-03 ENCOUNTER — Encounter (HOSPITAL_COMMUNITY): Payer: Medicare Other

## 2015-12-03 ENCOUNTER — Encounter (HOSPITAL_COMMUNITY): Payer: Medicare Other | Attending: Hematology & Oncology | Admitting: Hematology & Oncology

## 2015-12-03 VITALS — BP 133/74 | HR 92 | Temp 97.8°F | Resp 18 | Wt 218.8 lb

## 2015-12-03 DIAGNOSIS — I10 Essential (primary) hypertension: Secondary | ICD-10-CM | POA: Diagnosis not present

## 2015-12-03 DIAGNOSIS — N183 Chronic kidney disease, stage 3 unspecified: Secondary | ICD-10-CM

## 2015-12-03 DIAGNOSIS — D472 Monoclonal gammopathy: Secondary | ICD-10-CM | POA: Insufficient documentation

## 2015-12-03 DIAGNOSIS — M5136 Other intervertebral disc degeneration, lumbar region: Secondary | ICD-10-CM | POA: Insufficient documentation

## 2015-12-03 LAB — COMPREHENSIVE METABOLIC PANEL
ALT: 16 U/L — AB (ref 17–63)
AST: 23 U/L (ref 15–41)
Albumin: 4.4 g/dL (ref 3.5–5.0)
Alkaline Phosphatase: 77 U/L (ref 38–126)
Anion gap: 9 (ref 5–15)
BUN: 17 mg/dL (ref 6–20)
CO2: 29 mmol/L (ref 22–32)
CREATININE: 1.88 mg/dL — AB (ref 0.61–1.24)
Calcium: 9.6 mg/dL (ref 8.9–10.3)
Chloride: 103 mmol/L (ref 101–111)
GFR calc Af Amer: 39 mL/min — ABNORMAL LOW (ref >60)
GFR calc non Af Amer: 34 mL/min — ABNORMAL LOW (ref >60)
Glucose, Bld: 136 mg/dL — ABNORMAL HIGH (ref 65–99)
Potassium: 3.7 mmol/L (ref 3.5–5.1)
SODIUM: 141 mmol/L (ref 135–145)
Total Bilirubin: 0.8 mg/dL (ref 0.3–1.2)
Total Protein: 7.9 g/dL (ref 6.5–8.1)

## 2015-12-03 LAB — CBC WITH DIFFERENTIAL/PLATELET
BASOS ABS: 0 10*3/uL (ref 0.0–0.1)
Basophils Relative: 0 %
EOS ABS: 0.6 10*3/uL (ref 0.0–0.7)
Eosinophils Relative: 7 %
HCT: 45.1 % (ref 39.0–52.0)
Hemoglobin: 15.6 g/dL (ref 13.0–17.0)
Lymphocytes Relative: 29 %
Lymphs Abs: 2.7 10*3/uL (ref 0.7–4.0)
MCH: 35.2 pg — ABNORMAL HIGH (ref 26.0–34.0)
MCHC: 34.6 g/dL (ref 30.0–36.0)
MCV: 101.8 fL — ABNORMAL HIGH (ref 78.0–100.0)
MONO ABS: 0.7 10*3/uL (ref 0.1–1.0)
Monocytes Relative: 7 %
Neutro Abs: 5.3 10*3/uL (ref 1.7–7.7)
Neutrophils Relative %: 57 %
PLATELETS: 277 10*3/uL (ref 150–400)
RBC: 4.43 MIL/uL (ref 4.22–5.81)
RDW: 12.8 % (ref 11.5–15.5)
WBC: 9.4 10*3/uL (ref 4.0–10.5)

## 2015-12-03 NOTE — Patient Instructions (Signed)
[place] at [facility] Discharge Instructions  RECOMMENDATIONS MADE BY THE CONSULTANT AND ANY TEST RESULTS WILL BE SENT TO YOUR REFERRING PHYSICIAN.  Exam and discussion with Dr. Whitney Muse. Specimen jug given to collect 24 hour urine. Office visit in 6 months. Bone marrow biopsy as scheduled. Please go to radiology to have bone scan done today.  Thank you for choosing [place] at [facility] to provide your oncology and hematology care.  To afford each patient quality time with our provider, please arrive at least 15 minutes before your scheduled appointment time.    You need to re-schedule your appointment should you arrive 10 or more minutes late.  We strive to give you quality time with our providers, and arriving late affects you and other patients whose appointments are after yours.  Also, if you no show three or more times for appointments you may be dismissed from the clinic at the providers discretion.     Again, thank you for choosing [facility].  Our hope is that these requests will decrease the amount of time that you wait before being seen by our physicians.       _____________________________________________________________  Should you have questions after your visit to [facility], please contact our office at [phone] between the hours of 8:30 a.m. and 4:30 p.m.  Voicemails left after 4:30 p.m. will not be returned until the following business day.  For prescription refill requests, have your pharmacy contact our office.

## 2015-12-03 NOTE — Progress Notes (Signed)
[facility] NOTE  Patient Care Team: Celene Squibb, MD as PCP - General (Internal Medicine) Daneil Dolin, MD as Attending Physician (Gastroenterology) Fran Lowes, MD as Consulting Physician (Nephrology) Franchot Gallo, MD as Consulting Physician (Urology)  CHIEF COMPLAINTS/PURPOSE OF CONSULTATION:  MGUS Elevated kappa/lambda ratio of 5.07 Stage III CKD Normal 24 hour urine protein electrophoresis Bone survey on 09/2014 WNL  HISTORY OF PRESENTING ILLNESS:  [name] 75 y.o. male is here because of MGUS. He has CKD.  [name] returns to the [facility] alone today. He says that he's had some congestion in his head and stomach since before Christmas, but other than that he's been okay. His wife has also been suffering with this crud. He says "but other than that, jumping rope." He says that his eating has been okay since he says "everywhere I go, everyone says I'm fat as a hog, so I'll take it for what it's worth."  He did not get a flu shot this year, which he is "ashamed" about. He states that he normally gets it in October, and does plan on getting it.  He needs a myeloma survey.  Today, he wants more information on his kidneys. He sees Dr. Lowanda Foster for his kidneys, but he says "the same thing:" that his blood work looks fine. He worries about his "trajectory" for the future ie. Dialysis.Marland Kitchen  He confirms that his bowels are good, and during the physical exam, his lungs sound good. He denies any numbness or tingling in his fingers or toes.  His PCP is Dr. Nevada Crane.   MEDICAL HISTORY:  Past Medical History  Diagnosis Date  . Hypertension   . Myocardial infarction (HCC)     hx of abnormal ekg showed prior myocardial infarction  . Hyperlipidemia   . Asthma   . Hypothyroidism   . GERD (gastroesophageal reflux disease)     occasional  . Blood transfusion abn reaction or complication, no procedure mishap 1979    reaction due  to wrong blood type given  . Renal disorder   . H/O: pneumonia 02/27/2015    SURGICAL HISTORY: Past Surgical History  Procedure Laterality Date  . Tonsillectomy  1962  . Appendectomy  1963  . Cholecystectomy  1979  . Stomach surgery  2003    exploratory surgery, fatty tumors with obstruction?  . Hernia repair  2005  . Lumbar laminectomy/decompression microdiscectomy  07/26/2012    Procedure: LUMBAR LAMINECTOMY/DECOMPRESSION MICRODISCECTOMY;  Surgeon: Johnn Hai, MD;  Location: [facility];  Service: Orthopedics;  Laterality: N/A;  Lumbar Decompression L4-L5  . Colonoscopy  03/28/2008    KNL:ZJQBHA rectum; left-sided transverse diverticula diminutive polyp; descending colon. Adenomas.  . Colonoscopy N/A 06/07/2013    Procedure: COLONOSCOPY;  Surgeon: Daneil Dolin, MD;  Location: [facility];  Service: Endoscopy;  Laterality: N/A;  9:45    SOCIAL HISTORY: Social History   Social History  . Marital Status: Married    Spouse Name: N/A  . Number of Children: N/A  . Years of Education: N/A   Occupational History  . Not on file.   Social History Main Topics  . Smoking status: Never Smoker   . Smokeless tobacco: Never Used  . Alcohol Use: Yes     Comment: occasional  . Drug Use: No  . Sexual Activity: No   Other Topics Concern  . 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 His son is also a Chartered loss adjuster.  FAMILY HISTORY: Family History  Problem Relation Age of Onset  . Colon cancer Neg Hx   . Thyroid disease Mother   . Heart disease Mother   . Hypertension Mother   . Clotting disorder Father   . Hypertension Father   . Cancer Sister   . Hypertension Sister    indicated that his mother is deceased. He indicated that his father is alive. He indicated that his sister is deceased. He indicated that his brother is deceased.   Mother deceased at 42 yo from heart attack. 5 siblings.  ALLERGIES:  is  allergic to meperidine and related and sulfa antibiotics.  MEDICATIONS:  Current Outpatient Prescriptions  Medication Sig Dispense Refill  . ADVAIR DISKUS 100-50 MCG/DOSE AEPB Inhale 1 Dose into the lungs as needed.  2  . amLODipine (NORVASC) 10 MG tablet Take 10 mg by mouth daily.    Marland Kitchen aspirin EC 81 MG tablet Take 81 mg by mouth daily.    . cholecalciferol (VITAMIN D) 1000 UNITS tablet Take 1,000 Units by mouth daily.    Marland Kitchen levothyroxine (SYNTHROID, LEVOTHROID) 75 MCG tablet Take 75 mcg by mouth at bedtime.    . simvastatin (ZOCOR) 40 MG tablet Take 40 mg by mouth at bedtime.    . tamsulosin (FLOMAX) 0.4 MG CAPS capsule Take 0.4 mg by mouth daily.    Marland Kitchen triamterene-hydrochlorothiazide (DYAZIDE) 37.5-25 MG per capsule Take 1 capsule by mouth every other day. Every other day    . albuterol (PROVENTIL) (2.5 MG/3ML) 0.083% nebulizer solution Take 2.5 mg by nebulization every 6 (six) hours as needed. Reported on 12/03/2015    . levalbuterol (XOPENEX) 1.25 MG/3ML nebulizer solution Inhale 1.25 mg into the lungs 3 (three) times daily as needed. Reported on 12/03/2015     No current facility-administered medications for this visit.    Review of Systems  Constitutional: Negative for fever, chills, weight loss and malaise/fatigue.  HENT: Negative for congestion, hearing loss, nosebleeds, sore throat and tinnitus.   Eyes: Negative for blurred vision, double vision, pain and discharge.  Respiratory: Positive for wheezing. Negative for cough, hemoptysis, sputum production and shortness of breath.        Seasonal allergies, alleviated with breathing treatment.  Cardiovascular: Negative for chest pain, palpitations, claudication, leg swelling and PND.  Gastrointestinal: Negative for heartburn, nausea, vomiting, abdominal pain, diarrhea, constipation, blood in stool and melena.  Genitourinary: Negative for dysuria, urgency, frequency and hematuria.  Musculoskeletal: Negative for myalgias, joint pain and  falls.  Skin: Negative for itching and rash.  Neurological: Negative for dizziness, tingling, tremors, sensory change, speech change, focal weakness, seizures, loss of consciousness, weakness and headaches.  Endo/Heme/Allergies: Does not bruise/bleed easily.  Psychiatric/Behavioral: Negative for depression, suicidal ideas, memory loss and substance abuse. The patient is not nervous/anxious and does not have insomnia.    14 point review of systems was performed and is negative except as detailed under history of present illness and above   PHYSICAL EXAMINATION:  ECOG PERFORMANCE STATUS: 0 - Asymptomatic  Filed Vitals:   12/03/15 1126  BP: 133/74  Pulse: 92  Temp: 97.8 F (36.6 C)  Resp: 18   Filed Weights   12/03/15 1126  Weight: 218 lb 12.8 oz (99.247 kg)    Physical Exam  Constitutional: He is oriented to person, place, and time and well-developed, well-nourished, and in no distress.  HENT:  Head: Normocephalic and atraumatic.  Nose:  Nose normal.  Mouth/Throat: Oropharynx is clear and moist. No oropharyngeal exudate.  Eyes: Conjunctivae and EOM are normal. Pupils are equal, round, and reactive to light. Right eye exhibits no discharge. Left eye exhibits no discharge. No scleral icterus.  Neck: Normal range of motion. Neck supple. No tracheal deviation present. No thyromegaly present.  Cardiovascular: Normal rate, regular rhythm and normal heart sounds.  Exam reveals no gallop and no friction rub.   No murmur heard. Pulmonary/Chest: Effort normal. He has wheezes. He has no rales.  Rare R side wheezing.  Abdominal: Soft. Bowel sounds are normal. He exhibits no distension and no mass. There is no tenderness. There is no rebound and no guarding.  Musculoskeletal: Normal range of motion. He exhibits no edema.  Lymphadenopathy:    He has no cervical adenopathy.  Neurological: He is alert and oriented to person, place, and time. He has normal reflexes. No cranial nerve deficit.  Gait normal. Coordination normal.  Skin: Skin is warm and dry. No rash noted.  Psychiatric: Mood, memory, affect and judgment normal.  Nursing note and vitals reviewed.   LABORATORY DATA:  I have reviewed the data as listed Results for JEP, DYAS (MRN [id number])   Ref. Range 12/03/2015 10:38  WBC Latest Ref Range: 4.0-10.5 K/uL 9.4  RBC Latest Ref Range: 4.22-5.81 MIL/uL 4.43  Hemoglobin Latest Ref Range: 13.0-17.0 g/dL 72.4  HCT Latest Ref Range: 39.0-52.0 % 45.1  MCV Latest Ref Range: 78.0-100.0 fL 101.8 (H)  MCH Latest Ref Range: 26.0-34.0 pg 35.2 (H)  MCHC Latest Ref Range: 30.0-36.0 g/dL 19.5  RDW Latest Ref Range: 11.5-15.5 % 12.8  Platelets Latest Ref Range: 150-400 K/uL 277  Neutrophils Latest Units: % 57  Lymphocytes Latest Units: % 29  Monocytes Relative Latest Units: % 7  Eosinophil Latest Units: % 7  Basophil Latest Units: % 0  NEUT# Latest Ref Range: 1.7-7.7 K/uL 5.3  Lymphocyte # Latest Ref Range: 0.7-4.0 K/uL 2.7  Monocyte # Latest Ref Range: 0.1-1.0 K/uL 0.7  Eosinophils Absolute Latest Ref Range: 0.0-0.7 K/uL 0.6  Basophils Absolute Latest Ref Range: 0.0-0.1 K/uL 0.0    Results for ADYEN, BIFULCO (MRN [id number])   Ref. Range 12/03/2015 10:38  Sodium Latest Ref Range: 135-145 mmol/L 141  Potassium Latest Ref Range: 3.5-5.1 mmol/L 3.7  Chloride Latest Ref Range: 101-111 mmol/L 103  CO2 Latest Ref Range: 22-32 mmol/L 29  BUN Latest Ref Range: 6-20 mg/dL 17  Creatinine Latest Ref Range: 0.61-1.24 mg/dL 2.65 (H)  Calcium Latest Ref Range: 8.9-10.3 mg/dL 9.6  EGFR (Non-African Amer.) Latest Ref Range: >60 mL/min 34 (L)  EGFR (African American) Latest Ref Range: >60 mL/min 39 (L)  Glucose Latest Ref Range: 65-99 mg/dL 997 (H)  Anion gap Latest Ref Range: 5-15  9  Alkaline Phosphatase Latest Ref Range: 38-126 U/L 77  Albumin Latest Ref Range: 3.5-5.0 g/dL 4.4  AST Latest Ref Range: 15-41 U/L 23  ALT Latest Ref Range: 17-63 U/L 16 (L)  Total Protein Latest  Ref Range: 6.5-8.1 g/dL 7.9  Total Bilirubin Latest Ref Range: 0.3-1.2 mg/dL 0.8     ASSESSMENT & PLAN:  MGUS Elevated kappa/lambda ratio of 5.07 Stage III CKD Bone survey on 09/2014 WNL  He has not undergone a BMBX and we addressed this briefly today. Other than his kidney disease which is stable, his blood counts are preserved, calcium is WNL and last myeloma survey was normal.  He has brought back his 24 hr urine. He still needs to go downstairs  for his myeloma survey.  I recommend that he have a bone marrow biopsy done to put some of his fears to rest. He would like to do this after Valentine's day. I will see him back 2 weeks later, when the results for the bone marrow biopsy come in. At that time we will review the results of his bone marrow and other pending blood work from today.   Orders Placed This Encounter  Procedures  . Protein, urine, 24 hour    Standing Status: Future     Number of Occurrences:      Standing Expiration Date: 12/02/2016   All questions were answered. The patient knows to call the clinic with any problems, questions or concerns.  This document serves as a record of services personally performed by Ancil Linsey, MD. It was created on her behalf by Toni Amend, a trained medical scribe. The creation of this record is based on the scribe's personal observations and the provider's statements to them. This document has been checked and approved by the attending provider.  I have reviewed the above documentation for accuracy and completeness, and I agree with the above.  This note was electronically signed.    Molli Hazard, MD

## 2015-12-04 LAB — PROTEIN ELECTROPHORESIS, SERUM
A/G Ratio: 1.3 (ref 0.7–1.7)
ALBUMIN ELP: 4 g/dL (ref 2.9–4.4)
ALPHA-1-GLOBULIN: 0.2 g/dL (ref 0.0–0.4)
Alpha-2-Globulin: 0.8 g/dL (ref 0.4–1.0)
Beta Globulin: 1.1 g/dL (ref 0.7–1.3)
GAMMA GLOBULIN: 0.9 g/dL (ref 0.4–1.8)
GLOBULIN, TOTAL: 3.1 g/dL (ref 2.2–3.9)
Total Protein ELP: 7.1 g/dL (ref 6.0–8.5)

## 2015-12-04 LAB — IMMUNOFIXATION ELECTROPHORESIS
IGG (IMMUNOGLOBIN G), SERUM: 1071 mg/dL (ref 700–1600)
IgA: 179 mg/dL (ref 61–437)
IgM, Serum: 43 mg/dL (ref 15–143)
Total Protein ELP: 7 g/dL (ref 6.0–8.5)

## 2015-12-04 LAB — IGG, IGA, IGM
IGA: 177 mg/dL (ref 61–437)
IgG (Immunoglobin G), Serum: 1043 mg/dL (ref 700–1600)
IgM, Serum: 43 mg/dL (ref 15–143)

## 2015-12-04 LAB — PTH, INTACT AND CALCIUM
CALCIUM TOTAL (PTH): 9.8 mg/dL (ref 8.6–10.2)
PTH: 29 pg/mL (ref 15–65)

## 2015-12-04 LAB — KAPPA/LAMBDA LIGHT CHAINS
Kappa free light chain: 24.74 mg/L — ABNORMAL HIGH (ref 3.30–19.40)
Kappa, lambda light chain ratio: 2.53 — ABNORMAL HIGH (ref 0.26–1.65)
Lambda free light chains: 9.79 mg/L (ref 5.71–26.30)

## 2015-12-04 LAB — VITAMIN D 25 HYDROXY (VIT D DEFICIENCY, FRACTURES): VIT D 25 HYDROXY: 35.6 ng/mL (ref 30.0–100.0)

## 2015-12-05 DIAGNOSIS — D472 Monoclonal gammopathy: Secondary | ICD-10-CM | POA: Diagnosis not present

## 2015-12-05 LAB — PROTEIN, URINE, 24 HOUR
Collection Interval-UPROT: 24 hours
Protein, Urine: <6 mg/dL

## 2015-12-08 LAB — UIFE/LIGHT CHAINS/TP QN, 24-HR UR
% BETA, Urine: 0 %
ALPHA 1 URINE: 0 %
Albumin, U: 100 %
Alpha 2, Urine: 0 %
FREE KAPPA/LAMBDA RATIO: 11.44 — AB (ref 2.04–10.37)
FREE LAMBDA LT CHAINS, UR: 0.16 mg/L — AB (ref 0.24–6.66)
FREE LT CHN EXCR RATE: 1.83 mg/L (ref 1.35–24.19)
GAMMA GLOBULIN URINE: 0 %
Total Protein, Urine-Ur/day: <124 mg/24 hr (ref 30.0–150.0)
Volume, Urine: 3100 mL

## 2015-12-10 DIAGNOSIS — D472 Monoclonal gammopathy: Secondary | ICD-10-CM | POA: Diagnosis not present

## 2015-12-10 DIAGNOSIS — N183 Chronic kidney disease, stage 3 (moderate): Secondary | ICD-10-CM | POA: Diagnosis not present

## 2015-12-10 DIAGNOSIS — I10 Essential (primary) hypertension: Secondary | ICD-10-CM | POA: Diagnosis not present

## 2015-12-10 DIAGNOSIS — E669 Obesity, unspecified: Secondary | ICD-10-CM | POA: Diagnosis not present

## 2015-12-10 LAB — IMMUNOFIXATION, URINE

## 2016-01-12 ENCOUNTER — Other Ambulatory Visit: Payer: Self-pay | Admitting: Radiology

## 2016-01-13 ENCOUNTER — Encounter (HOSPITAL_COMMUNITY): Payer: Self-pay

## 2016-01-13 ENCOUNTER — Ambulatory Visit (HOSPITAL_COMMUNITY)
Admission: RE | Admit: 2016-01-13 | Discharge: 2016-01-13 | Disposition: A | Discharge status: Home / Self Care | Payer: Medicare Other | Source: Ambulatory Visit | Attending: Hematology & Oncology | Admitting: Hematology & Oncology

## 2016-01-13 DIAGNOSIS — D7589 Other specified diseases of blood and blood-forming organs: Secondary | ICD-10-CM | POA: Insufficient documentation

## 2016-01-13 DIAGNOSIS — I129 Hypertensive chronic kidney disease with stage 1 through stage 4 chronic kidney disease, or unspecified chronic kidney disease: Secondary | ICD-10-CM | POA: Diagnosis not present

## 2016-01-13 DIAGNOSIS — I252 Old myocardial infarction: Secondary | ICD-10-CM | POA: Insufficient documentation

## 2016-01-13 DIAGNOSIS — J45909 Unspecified asthma, uncomplicated: Secondary | ICD-10-CM | POA: Insufficient documentation

## 2016-01-13 DIAGNOSIS — E785 Hyperlipidemia, unspecified: Secondary | ICD-10-CM | POA: Insufficient documentation

## 2016-01-13 DIAGNOSIS — D72822 Plasmacytosis: Secondary | ICD-10-CM | POA: Diagnosis not present

## 2016-01-13 DIAGNOSIS — N189 Chronic kidney disease, unspecified: Secondary | ICD-10-CM | POA: Insufficient documentation

## 2016-01-13 DIAGNOSIS — D472 Monoclonal gammopathy: Secondary | ICD-10-CM | POA: Diagnosis not present

## 2016-01-13 DIAGNOSIS — E039 Hypothyroidism, unspecified: Secondary | ICD-10-CM | POA: Insufficient documentation

## 2016-01-13 DIAGNOSIS — Z01812 Encounter for preprocedural laboratory examination: Secondary | ICD-10-CM | POA: Diagnosis not present

## 2016-01-13 DIAGNOSIS — K219 Gastro-esophageal reflux disease without esophagitis: Secondary | ICD-10-CM | POA: Insufficient documentation

## 2016-01-13 DIAGNOSIS — M899 Disorder of bone, unspecified: Secondary | ICD-10-CM | POA: Diagnosis not present

## 2016-01-13 LAB — CBC WITH DIFFERENTIAL/PLATELET
BASOS ABS: 0.1 10*3/uL (ref 0.0–0.1)
BASOS PCT: 1 %
EOS ABS: 0.8 10*3/uL — AB (ref 0.0–0.7)
Eosinophils Relative: 10 %
HEMATOCRIT: 44.8 % (ref 39.0–52.0)
HEMOGLOBIN: 14.8 g/dL (ref 13.0–17.0)
Lymphocytes Relative: 26 %
Lymphs Abs: 2 10*3/uL (ref 0.7–4.0)
MCH: 34.1 pg — ABNORMAL HIGH (ref 26.0–34.0)
MCHC: 33 g/dL (ref 30.0–36.0)
MCV: 103.2 fL — ABNORMAL HIGH (ref 78.0–100.0)
MONOS PCT: 11 %
Monocytes Absolute: 0.8 10*3/uL (ref 0.1–1.0)
NEUTROS ABS: 4 10*3/uL (ref 1.7–7.7)
NEUTROS PCT: 52 %
Platelets: 296 10*3/uL (ref 150–400)
RBC: 4.34 MIL/uL (ref 4.22–5.81)
RDW: 13.4 % (ref 11.5–15.5)
WBC: 7.7 10*3/uL (ref 4.0–10.5)

## 2016-01-13 LAB — BASIC METABOLIC PANEL
ANION GAP: 9 (ref 5–15)
BUN: 23 mg/dL — ABNORMAL HIGH (ref 6–20)
CALCIUM: 9.8 mg/dL (ref 8.9–10.3)
CO2: 26 mmol/L (ref 22–32)
CREATININE: 1.69 mg/dL — AB (ref 0.61–1.24)
Chloride: 106 mmol/L (ref 101–111)
GFR, EST AFRICAN AMERICAN: 45 mL/min — AB (ref >60)
GFR, EST NON AFRICAN AMERICAN: 38 mL/min — AB (ref >60)
Glucose, Bld: 131 mg/dL — ABNORMAL HIGH (ref 65–99)
Potassium: 3.8 mmol/L (ref 3.5–5.1)
SODIUM: 141 mmol/L (ref 135–145)

## 2016-01-13 LAB — PROTIME-INR
INR: 1.06 (ref 0.00–1.49)
PROTHROMBIN TIME: 14 s (ref 11.6–15.2)

## 2016-01-13 LAB — BONE MARROW EXAM

## 2016-01-13 MED ORDER — SODIUM CHLORIDE 0.9 % IV SOLN
INTRAVENOUS | Status: DC
  Administered 2016-01-13: 09:00:00 via INTRAVENOUS

## 2016-01-13 MED ORDER — MIDAZOLAM HCL 2 MG/2ML IJ SOLN
INTRAMUSCULAR | Status: AC | PRN
  Administered 2016-01-13 (×3): 1 mg via INTRAVENOUS

## 2016-01-13 MED ORDER — FENTANYL CITRATE (PF) 100 MCG/2ML IJ SOLN
INTRAMUSCULAR | Status: AC | PRN
  Administered 2016-01-13: 50 ug via INTRAVENOUS

## 2016-01-13 MED ORDER — HYDROCODONE-ACETAMINOPHEN 5-325 MG PO TABS
1.0000 | ORAL_TABLET | ORAL | Status: DC | PRN
  Filled 2016-01-13: qty 2

## 2016-01-13 MED ORDER — FENTANYL CITRATE (PF) 100 MCG/2ML IJ SOLN
INTRAMUSCULAR | Status: AC
  Filled 2016-01-13: qty 4

## 2016-01-13 MED ORDER — MIDAZOLAM HCL 2 MG/2ML IJ SOLN
INTRAMUSCULAR | Status: AC
  Filled 2016-01-13: qty 6

## 2016-01-13 NOTE — H&P (Signed)
Chief Complaint: Patient was seen in consultation today for CT guided bone marrow biopsy  Referring Physician(s): Penland,Shannon K  History of Present Illness: [name] is a 75 y.o. male with history of stage III chronic kidney disease and MGUS who presents today for CT-guided bone marrow biopsy for further evaluation. Recent bone survey revealed no skeletal findings to suggest myeloma or other bony infiltrative process.  Past Medical History  Diagnosis Date  . Hypertension   . Myocardial infarction (HCC)     hx of abnormal ekg showed prior myocardial infarction  . Hyperlipidemia   . Asthma   . Hypothyroidism   . GERD (gastroesophageal reflux disease)     occasional  . Blood transfusion abn reaction or complication, no procedure mishap 1979    reaction due to wrong blood type given  . Renal disorder   . H/O: pneumonia 02/27/2015    Past Surgical History  Procedure Laterality Date  . Tonsillectomy  1962  . Appendectomy  1963  . Cholecystectomy  1979  . Stomach surgery  2003    exploratory surgery, fatty tumors with obstruction?  . Hernia repair  2005  . Lumbar laminectomy/decompression microdiscectomy  07/26/2012    Procedure: LUMBAR LAMINECTOMY/DECOMPRESSION MICRODISCECTOMY;  Surgeon: Johnn Hai, MD;  Location: [facility];  Service: Orthopedics;  Laterality: N/A;  Lumbar Decompression L4-L5  . Colonoscopy  03/28/2008    FUX:NATFTD rectum; left-sided transverse diverticula diminutive polyp; descending colon. Adenomas.  . Colonoscopy N/A 06/07/2013    Procedure: COLONOSCOPY;  Surgeon: Daneil Dolin, MD;  Location: [facility];  Service: Endoscopy;  Laterality: N/A;  9:45    Allergies: Meperidine and related and Sulfa antibiotics  Medications: Prior to Admission medications   Medication Sig Start Date End Date Taking? Authorizing Provider  acetaminophen (TYLENOL) 500 MG tablet Take 500 mg by mouth every 6 (six) hours as needed.   Yes Historical Provider, MD    ADVAIR DISKUS 100-50 MCG/DOSE AEPB Inhale 1 Dose into the lungs as needed. 07/25/14  Yes Historical Provider, MD  albuterol (PROVENTIL) (2.5 MG/3ML) 0.083% nebulizer solution Take 2.5 mg by nebulization every 6 (six) hours as needed. Reported on 12/03/2015   Yes Historical Provider, MD  amLODipine (NORVASC) 10 MG tablet Take 10 mg by mouth daily.   Yes Historical Provider, MD  aspirin EC 81 MG tablet Take 81 mg by mouth daily.   Yes Historical Provider, MD  cholecalciferol (VITAMIN D) 1000 UNITS tablet Take 1,000 Units by mouth daily.   Yes Historical Provider, MD  levothyroxine (SYNTHROID, LEVOTHROID) 75 MCG tablet Take 75 mcg by mouth at bedtime.   Yes Historical Provider, MD  simvastatin (ZOCOR) 40 MG tablet Take 40 mg by mouth at bedtime.   Yes Historical Provider, MD  tamsulosin (FLOMAX) 0.4 MG CAPS capsule Take 0.4 mg by mouth daily.   Yes Historical Provider, MD  triamterene-hydrochlorothiazide (DYAZIDE) 37.5-25 MG per capsule Take 1 capsule by mouth every other day. Every other day   Yes Historical Provider, MD  levalbuterol (XOPENEX) 1.25 MG/3ML nebulizer solution Inhale 1.25 mg into the lungs 3 (three) times daily as needed. Reported on 12/03/2015 04/24/13   Historical Provider, MD     Family History  Problem Relation Age of Onset  . Colon cancer Neg Hx   . Thyroid disease Mother   . Heart disease Mother   . Hypertension Mother   . Clotting disorder Father   . Hypertension Father   . Cancer Sister   .  Hypertension Sister     Social History   Social History  . Marital Status: Married    Spouse Name: N/A  . Number of Children: N/A  . Years of Education: N/A   Social History Main Topics  . Smoking status: Never Smoker   . Smokeless tobacco: Never Used  . Alcohol Use: Yes     Comment: occasional  . Drug Use: No  . Sexual Activity: No   Other Topics Concern  . None   Social History Narrative     Review of Systems  Constitutional: Negative for fever and chills.   Respiratory: Negative for cough.        Some  occ dyspnea with exertion  Cardiovascular: Negative for chest pain.  Gastrointestinal: Negative for nausea, vomiting, abdominal pain and blood in stool.  Genitourinary: Negative for dysuria and hematuria.  Musculoskeletal: Positive for back pain.  Neurological: Negative for headaches.    Vital Signs: BP 149/81 mmHg  Pulse 67  Temp(Src) 97.7 F (36.5 C) (Oral)  Resp 18  Ht 5' 10.5" (1.791 m)  Wt 218 lb 6.4 oz (99.066 kg)  BMI 30.88 kg/m2  SpO2 98%  Physical Exam  Constitutional: He is oriented to person, place, and time. He appears well-developed and well-nourished.  Cardiovascular: Normal rate and regular rhythm.   Pulmonary/Chest: Effort normal.  Few left basilar crackles, right clear  Abdominal: Soft. Bowel sounds are normal. There is no tenderness.  Musculoskeletal: Normal range of motion. He exhibits no edema.  Neurological: He is alert and oriented to person, place, and time.    Mallampati Score:     Imaging: No results found.  Labs:  CBC:  Recent Labs  04/23/15 1224 12/03/15 1038 01/13/16 0910  WBC 8.4 9.4 7.7  HGB 15.0 15.6 14.8  HCT 44.4 45.1 44.8  PLT 327 277 296    COAGS:  Recent Labs  01/13/16 0910  INR 1.06    BMP:  Recent Labs  04/23/15 1224 12/03/15 1038 12/03/15 1110 01/13/16 0910  NA 140 141  --  141  K 3.7 3.7  --  3.8  CL 102 103  --  106  CO2 28 29  --  26  GLUCOSE 111* 136*  --  131*  BUN 17 17  --  23*  CALCIUM 9.5 9.6 9.8 9.8  CREATININE 1.63* 1.88*  --  1.69*  GFRNONAA 40* 34*  --  38*  GFRAA 47* 39*  --  45*    LIVER FUNCTION TESTS:  Recent Labs  04/23/15 1224 12/03/15 1038  BILITOT 0.7 0.8  AST 28 23  ALT 21 16*  ALKPHOS 76 77  PROT 8.0 7.9  ALBUMIN 4.5 4.4    TUMOR MARKERS: No results for input(s): AFPTM, CEA, CA199, CHROMGRNA in the last 8760 hours.  Assessment and Plan:  75 y.o. male with history of stage III chronic kidney disease and MGUS who  presents today for CT-guided bone marrow biopsy for further evaluation. Recent bone survey revealed no skeletal findings to suggest myeloma or other bony infiltrative process.Risks and benefits discussed with the patient/wife including, but not limited to bleeding, infection, damage to adjacent structures or low yield requiring additional tests.All of the patient's questions were answered, patient is agreeable to proceed. Consent signed and in chart.      Thank you for this interesting consult.  I greatly enjoyed meeting [name] and look forward to participating in their care.  A copy of this report was sent to the  requesting provider on this date.  Electronically Signed: D. Rowe Robert 01/13/2016, 10:06 AM   I spent a total of 15 minutes in face to face in clinical consultation, greater than 50% of which was counseling/coordinating care for CT guided bone marrow biopsy

## 2016-01-13 NOTE — Procedures (Signed)
CT-guided  R iliac bone marrow aspiration and core biopsy No complication No blood loss. See complete dictation in [facility] PACS  

## 2016-01-13 NOTE — Discharge Instructions (Signed)
Bone Marrow Aspiration and Bone Marrow Biopsy °Bone marrow aspiration and bone marrow biopsy are procedures that are done to diagnose blood disorders. You may also have one of these procedures to help diagnose infections or some types of cancer. °Bone marrow is the soft tissue that is inside your bones. Blood cells are produced in bone marrow. For bone marrow aspiration, a sample of tissue in liquid form is removed from inside your bone. For a bone marrow biopsy, a small core of bone marrow tissue is removed. Then these samples are examined under a microscope or tested in a lab. °You may need these procedures if you have an abnormal complete blood count (CBC). The aspiration or biopsy sample is usually taken from the top of your hip bone. Sometimes, an aspiration sample is taken from your chest bone (sternum). °LET YOUR HEALTH CARE PROVIDER KNOW ABOUT: °· Any allergies you have. °· All medicines you are taking, including vitamins, herbs, eye drops, creams, and over-the-counter medicines. °· Previous problems you or members of your family have had with the use of anesthetics. °· Any blood disorders you have. °· Previous surgeries you have had. °· Any medical conditions you may have. °· Whether you are pregnant or you think that you may be pregnant. °RISKS AND COMPLICATIONS °Generally, this is a safe procedure. However, problems may occur, including: °· Infection. °· Bleeding. °BEFORE THE PROCEDURE °· Ask your health care provider about: °¨ Changing or stopping your regular medicines. This is especially important if you are taking diabetes medicines or blood thinners. °¨ Taking medicines such as aspirin and ibuprofen. These medicines can thin your blood. Do not take these medicines before your procedure if your health care provider instructs you not to. °· Plan to have someone take you home after the procedure. °· If you go home right after the procedure, plan to have someone with you for 24 hours. °PROCEDURE  °· An  IV tube may be inserted into one of your veins. °· The injection site will be cleaned with a germ-killing solution (antiseptic). °· You will be given one or more of the following: °¨ A medicine that helps you relax (sedative). °¨ A medicine that numbs the area (local anesthetic). °· The bone marrow sample will be removed as follows: °¨ For an aspiration, a hollow needle will be inserted through your skin and into your bone. Bone marrow fluid will be drawn up into a syringe. °¨ For a biopsy, your health care provider will use a hollow needle to remove a core of tissue from your bone marrow. °· The needle will be removed. °· A bandage (dressing) will be placed over the insertion site and taped in place. °The procedure may vary among health care providers and hospitals. °AFTER THE PROCEDURE °· Your blood pressure, heart rate, breathing rate, and blood oxygen level will be monitored often until the medicines you were given have worn off. °· Return to your normal activities as directed by your health care provider. °  °This information is not intended to replace advice given to you by your health care provider. Make sure you discuss any questions you have with your health care provider. °  °Document Released: 11/11/2004 Document Revised: 03/25/2015 Document Reviewed: 10/30/2014 °Elsevier Interactive Patient Education ©2016 Elsevier Inc. °Bone Marrow Aspiration and Bone Marrow Biopsy, Care After °Refer to this sheet in the next few weeks. These instructions provide you with information about caring for yourself after your procedure. Your health care provider may also give you   more specific instructions. Your treatment has been planned according to current medical practices, but problems sometimes occur. Call your health care provider if you have any problems or questions after your procedure. WHAT TO EXPECT AFTER THE PROCEDURE After your procedure, it is common to have:  Soreness or tenderness around the puncture  site.  Bruising. HOME CARE INSTRUCTIONS  Take medicines only as directed by your health care provider.  Follow your health care provider's instructions about:  Puncture site care.  Bandage (dressing) changes and removal.  Bathe and shower as directed by your health care provider.  Check your puncture site every day for signs of infection. Watch for:  Redness, swelling, or pain.  Fluid, blood, or pus.  Return to your normal activities as directed by your health care provider.  Keep all follow-up visits as directed by your health care provider. This is important. SEEK MEDICAL CARE IF:  You have a fever.  You have uncontrollable bleeding.  You have redness, swelling, or pain at the site of your puncture.  You have fluid, blood, or pus coming from your puncture site.   This information is not intended to replace advice given to you by your health care provider. Make sure you discuss any questions you have with your health care provider.   Document Released: 05/28/2005 Document Revised: 03/25/2015 Document Reviewed: 10/30/2014 Elsevier Interactive Patient Education 2016 Elsevier Inc. Moderate Conscious Sedation, Adult Sedation is the use of medicines to promote relaxation and relieve discomfort and anxiety. Moderate conscious sedation is a type of sedation. Under moderate conscious sedation you are less alert than normal but are still able to respond to instructions or stimulation. Moderate conscious sedation is used during short medical and dental procedures. It is milder than deep sedation or general anesthesia and allows you to return to your regular activities sooner. LET [facility] CARE PROVIDER KNOW ABOUT:   Any allergies you have.  All medicines you are taking, including vitamins, herbs, eye drops, creams, and over-the-counter medicines.  Use of steroids (by mouth or creams).  Previous problems you or members of your family have had with the use of  anesthetics.  Any blood disorders you have.  Previous surgeries you have had.  Medical conditions you have.  Possibility of pregnancy, if this applies.  Use of cigarettes, alcohol, or illegal drugs. RISKS AND COMPLICATIONS Generally, this is a safe procedure. However, as with any procedure, problems can occur. Possible problems include:  Oversedation.  Trouble breathing on your own. You may need to have a breathing tube until you are awake and breathing on your own.  Allergic reaction to any of the medicines used for the procedure. BEFORE THE PROCEDURE  You may have blood tests done. These tests can help show how well your kidneys and liver are working. They can also show how well your blood clots.  A physical exam will be done.  Only take medicines as directed by your health care provider. You may need to stop taking medicines (such as blood thinners, aspirin, or nonsteroidal anti-inflammatory drugs) before the procedure.   Do not eat or drink at least 6 hours before the procedure or as directed by your health care provider.  Arrange for a responsible adult, family member, or friend to take you home after the procedure. He or she should stay with you for at least 24 hours after the procedure, until the medicine has worn off. PROCEDURE   An intravenous (IV) catheter will be inserted into one of your  veins. Medicine will be able to flow directly into your body through this catheter. You may be given medicine through this tube to help prevent pain and help you relax.  The medical or dental procedure will be done. AFTER THE PROCEDURE  You will stay in a recovery area until the medicine has worn off. Your blood pressure and pulse will be checked.   Depending on the procedure you had, you may be allowed to go home when you can tolerate liquids and your pain is under control.   This information is not intended to replace advice given to you by your health care provider. Make  sure you discuss any questions you have with your health care provider.   Document Released: 08/03/2001 Document Revised: 11/29/2014 Document Reviewed: 07/16/2013 Elsevier Interactive Patient Education [employer].

## 2016-01-16 ENCOUNTER — Ambulatory Visit (HOSPITAL_COMMUNITY): Payer: BC Managed Care – PPO | Admitting: Hematology & Oncology

## 2016-01-19 DIAGNOSIS — D225 Melanocytic nevi of trunk: Secondary | ICD-10-CM | POA: Diagnosis not present

## 2016-01-19 DIAGNOSIS — L570 Actinic keratosis: Secondary | ICD-10-CM | POA: Diagnosis not present

## 2016-01-20 LAB — CHROMOSOME ANALYSIS, BONE MARROW

## 2016-01-22 ENCOUNTER — Encounter (HOSPITAL_COMMUNITY): Payer: Self-pay | Admitting: Hematology & Oncology

## 2016-01-22 ENCOUNTER — Encounter (HOSPITAL_COMMUNITY): Payer: Medicare Other | Attending: Hematology & Oncology | Admitting: Hematology & Oncology

## 2016-01-22 VITALS — BP 134/81 | HR 89 | Temp 98.0°F | Resp 18 | Wt 220.0 lb

## 2016-01-22 DIAGNOSIS — N183 Chronic kidney disease, stage 3 unspecified: Secondary | ICD-10-CM

## 2016-01-22 DIAGNOSIS — D472 Monoclonal gammopathy: Secondary | ICD-10-CM | POA: Diagnosis not present

## 2016-01-22 NOTE — Progress Notes (Signed)
[place] [facility] NOTE  Patient Care Team: Celene Squibb, MD as PCP - General (Internal Medicine) Daneil Dolin, MD as Attending Physician (Gastroenterology) Fran Lowes, MD as Consulting Physician (Nephrology) Franchot Gallo, MD as Consulting Physician (Urology)  CHIEF COMPLAINTS/PURPOSE OF CONSULTATION:  MGUS Elevated kappa/lambda ratio of 5.07 Stage III CKD Normal 24 hour urine protein electrophoresis Bone survey on 09/2014 WNL BMBX on 01/13/2016 with plasma cells at 7% with a POLYCLONAL staining pattern  HISTORY OF PRESENTING ILLNESS:  [name] 75 y.o. male is here because of MGUS. He has CKD. He is here to review recent BMBX.   [name] is here with his wife. I personally reviewed and went over pathology results and MGUS diagnosis with the patient.   He had bone marrow biopsy on 01/13/2016. He experienced soreness at the biopsy site for 1 and a half days. He has had no other issues with it. He was very happy with his procedure at [facility].  He notes he feels well. He is without complaints or concerns.  MEDICAL HISTORY:  Past Medical History  Diagnosis Date  . Hypertension   . Myocardial infarction (HCC)     hx of abnormal ekg showed prior myocardial infarction  . Hyperlipidemia   . Asthma   . Hypothyroidism   . GERD (gastroesophageal reflux disease)     occasional  . Blood transfusion abn reaction or complication, no procedure mishap 1979    reaction due to wrong blood type given  . Renal disorder   . H/O: pneumonia 02/27/2015    SURGICAL HISTORY: Past Surgical History  Procedure Laterality Date  . Tonsillectomy  1962  . Appendectomy  1963  . Cholecystectomy  1979  . Stomach surgery  2003    exploratory surgery, fatty tumors with obstruction?  . Hernia repair  2005  . Lumbar laminectomy/decompression microdiscectomy  07/26/2012    Procedure: LUMBAR LAMINECTOMY/DECOMPRESSION MICRODISCECTOMY;  Surgeon: Johnn Hai, MD;  Location: [facility];  Service: Orthopedics;  Laterality: N/A;  Lumbar Decompression L4-L5  . Colonoscopy  03/28/2008    ZOX:WRUEAV rectum; left-sided transverse diverticula diminutive polyp; descending colon. Adenomas.  . Colonoscopy N/A 06/07/2013    Procedure: COLONOSCOPY;  Surgeon: Daneil Dolin, MD;  Location: [facility];  Service: Endoscopy;  Laterality: N/A;  9:45    SOCIAL HISTORY: Social History   Social History  . Marital Status: Married    Spouse Name: N/A  . Number of Children: N/A  . Years of Education: N/A   Occupational History  . Not on file.   Social History Main Topics  . Smoking status: Never Smoker   . Smokeless tobacco: Never Used  . Alcohol Use: Yes     Comment: occasional  . Drug Use: No  . Sexual Activity: No   Other Topics Concern  . 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 His son is also a Emergency planning/management officer.  FAMILY HISTORY: Family History  Problem Relation Age of Onset  . Colon cancer Neg Hx   . Thyroid disease Mother   . Heart disease Mother   . Hypertension Mother   . Clotting disorder Father   . Hypertension Father   . Cancer Sister   . Hypertension Sister    indicated that his mother is deceased. He indicated that his father is alive. He indicated that his sister is deceased.  He indicated that his brother is deceased.   Mother deceased at 58 yo from heart attack. 5 siblings.  ALLERGIES:  is allergic to meperidine and related and sulfa antibiotics.  MEDICATIONS:  Current Outpatient Prescriptions  Medication Sig Dispense Refill  . acetaminophen (TYLENOL) 500 MG tablet Take 500 mg by mouth every 6 (six) hours as needed.    Marland Kitchen ADVAIR DISKUS 100-50 MCG/DOSE AEPB Inhale 1 Dose into the lungs as needed.  2  . albuterol (PROVENTIL) (2.5 MG/3ML) 0.083% nebulizer solution Take 2.5 mg by nebulization every 6 (six) hours as needed. Reported on 12/03/2015    . amLODipine  (NORVASC) 10 MG tablet Take 10 mg by mouth daily.    Marland Kitchen aspirin EC 81 MG tablet Take 81 mg by mouth daily.    . cholecalciferol (VITAMIN D) 1000 UNITS tablet Take 1,000 Units by mouth daily.    Marland Kitchen levalbuterol (XOPENEX) 1.25 MG/3ML nebulizer solution Inhale 1.25 mg into the lungs 3 (three) times daily as needed. Reported on 12/03/2015    . levothyroxine (SYNTHROID, LEVOTHROID) 75 MCG tablet Take 75 mcg by mouth at bedtime.    . simvastatin (ZOCOR) 40 MG tablet Take 40 mg by mouth at bedtime.    . tamsulosin (FLOMAX) 0.4 MG CAPS capsule Take 0.4 mg by mouth daily.    Marland Kitchen triamterene-hydrochlorothiazide (DYAZIDE) 37.5-25 MG per capsule Take 1 capsule by mouth every other day. Every other day     No current facility-administered medications for this visit.    Review of Systems  Constitutional: Negative for fever, chills, weight loss and malaise/fatigue.  HENT: Negative for congestion, hearing loss, nosebleeds, sore throat and tinnitus.   Eyes: Negative for blurred vision, double vision, pain and discharge.  Respiratory:  Negative for cough, wheezing, hemoptysis, sputum production and shortness of breath.        Seasonal allergies, alleviated with breathing treatment.  Cardiovascular: Negative for chest pain, palpitations, claudication, leg swelling and PND.  Gastrointestinal: Negative for heartburn, nausea, vomiting, abdominal pain, diarrhea, constipation, blood in stool and melena.  Genitourinary: Negative for dysuria, urgency, frequency and hematuria.  Musculoskeletal: Negative for myalgias, joint pain and falls.  Skin: Negative for itching and rash.  Neurological: Negative for dizziness, tingling, tremors, sensory change, speech change, focal weakness, seizures, loss of consciousness, weakness and headaches.  Endo/Heme/Allergies: Does not bruise/bleed easily.  Psychiatric/Behavioral: Negative for depression, suicidal ideas, memory loss and substance abuse. The patient is not nervous/anxious and  does not have insomnia.    14 point review of systems was performed and is negative except as detailed under history of present illness and above   PHYSICAL EXAMINATION:  ECOG PERFORMANCE STATUS: 0 - Asymptomatic  Filed Vitals:   01/22/16 1129  BP: 134/81  Pulse: 89  Temp: 98 F (36.7 C)  Resp: 18   Filed Weights   01/22/16 1129  Weight: 220 lb (99.791 kg)    Physical Exam  Constitutional: He is oriented to person, place, and time and well-developed, well-nourished, and in no distress.  HENT:  Head: Normocephalic and atraumatic.  Nose: Nose normal.  Mouth/Throat: Oropharynx is clear and moist. No oropharyngeal exudate.  Eyes: Conjunctivae and EOM are normal. Pupils are equal, round, and reactive to light. Right eye exhibits no discharge. Left eye exhibits no discharge. No scleral icterus.  Neck: Normal range of motion. Neck supple. No tracheal deviation present. No thyromegaly present.  Cardiovascular: Normal rate, regular rhythm and normal heart sounds.  Exam reveals no gallop and no friction rub.  No murmur heard. Pulmonary/Chest: Effort normal.  He has no rales.  Abdominal: Soft. Bowel sounds are normal. He exhibits no distension and no mass. There is no tenderness. There is no rebound and no guarding.  Musculoskeletal: Normal range of motion. He exhibits no edema.  Lymphadenopathy:    He has no cervical adenopathy.  Neurological: He is alert and oriented to person, place, and time. He has normal reflexes. No cranial nerve deficit. Gait normal. Coordination normal.  Skin: Skin is warm and dry. No rash noted.  Psychiatric: Mood, memory, affect and judgment normal.  Nursing note and vitals reviewed.   LABORATORY DATA:  I have reviewed the data as listed  Results for [name], [name] (MRN [id number]) as of 01/22/2016 16:00  Ref. Range 01/13/2016 09:10  Sodium Latest Ref Range: 135-145 mmol/L 141  Potassium Latest Ref Range: 3.5-5.1 mmol/L 3.8  Chloride Latest Ref Range:  101-111 mmol/L 106  CO2 Latest Ref Range: 22-32 mmol/L 26  BUN Latest Ref Range: 6-20 mg/dL 23 (H)  Creatinine Latest Ref Range: 0.61-1.24 mg/dL 1.69 (H)  Calcium Latest Ref Range: 8.9-10.3 mg/dL 9.8  EGFR (Non-African Amer.) Latest Ref Range: >60 mL/min 38 (L)  EGFR (African American) Latest Ref Range: >60 mL/min 45 (L)  Glucose Latest Ref Range: 65-99 mg/dL 131 (H)  Anion gap Latest Ref Range: 5-15  9  WBC Latest Ref Range: 4.0-10.5 K/uL 7.7  RBC Latest Ref Range: 4.22-5.81 MIL/uL 4.34  Hemoglobin Latest Ref Range: 13.0-17.0 g/dL 14.8  HCT Latest Ref Range: 39.0-52.0 % 44.8  MCV Latest Ref Range: 78.0-100.0 fL 103.2 (H)  MCH Latest Ref Range: 26.0-34.0 pg 34.1 (H)  MCHC Latest Ref Range: 30.0-36.0 g/dL 33.0  RDW Latest Ref Range: 11.5-15.5 % 13.4  Platelets Latest Ref Range: 150-400 K/uL 296  Neutrophils Latest Units: % 52  Lymphocytes Latest Units: % 26  Monocytes Relative Latest Units: % 11  Eosinophil Latest Units: % 10  Basophil Latest Units: % 1  NEUT# Latest Ref Range: 1.7-7.7 K/uL 4.0  Lymphocyte # Latest Ref Range: 0.7-4.0 K/uL 2.0  Monocyte # Latest Ref Range: 0.1-1.0 K/uL 0.8  Eosinophils Absolute Latest Ref Range: 0.0-0.7 K/uL 0.8 (H)  Basophils Absolute Latest Ref Range: 0.0-0.1 K/uL 0.1  Prothrombin Time Latest Ref Range: 11.6-15.2 seconds 14.0  INR Latest Ref Range: 0.00-1.49  1.06     Pathology:    ASSESSMENT & PLAN:  MGUS Elevated kappa/lambda ratio of 5.07 Stage III CKD Bone survey on 09/2014 WNL  We reviewed his BMBX, he has 7% plasma cells but they are POLYCLONAL. We spent time discussing this today. Based upon BMBX, peripheral labs he has MGUS.  He was given an informational packet about MGUS. Questions were answered.  We will continue with ongoing observation with a 6 month return, labs, and physical exam.   All questions were answered. The patient knows to call the clinic with any problems, questions or concerns.  This document serves as a  record of services personally performed by Ancil Linsey, MD. It was created on her behalf by Kandace Blitz, a trained medical scribe. The creation of this record is based on the scribe's personal observations and the provider's statements to them. This document has been checked and approved by the attending provider.  I have reviewed the above documentation for accuracy and completeness, and I agree with the above.  This note was electronically signed.    Molli Hazard, MD

## 2016-01-22 NOTE — Patient Instructions (Addendum)
[place] at [facility] Discharge Instructions  RECOMMENDATIONS MADE BY THE CONSULTANT AND ANY TEST RESULTS WILL BE SENT TO YOUR REFERRING PHYSICIAN.   Exam and discussion by Dr Whitney Muse today.  Bone marrow did show some plasma cells, but nothing to worry about we will just follow your blood work. Your plasma cell were less than 10%. We follow it because it can change over time.  MGUS-usually follow this through blood test   Return to see the doctor in 6 months with labs. Please call the clinic if you have any questions or concerns.       Thank you for choosing [place] at [facility] to provide your oncology and hematology care.  To afford each patient quality time with our provider, please arrive at least 15 minutes before your scheduled appointment time.   Beginning January 23rd 2017 lab work for the [facility] will be done in the  Main lab at [facility] on 1st floor. If you have a lab appointment with the [facility] please come in thru the  Main Entrance and check in at the main information desk  You need to re-schedule your appointment should you arrive 10 or more minutes late.  We strive to give you quality time with our providers, and arriving late affects you and other patients whose appointments are after yours.  Also, if you no show three or more times for appointments you may be dismissed from the clinic at the providers discretion.     Again, thank you for choosing [facility].  Our hope is that these requests will decrease the amount of time that you wait before being seen by our physicians.       _____________________________________________________________  Should you have questions after your visit to [facility], please contact our office at [phone] between the hours of 8:30 a.m. and 4:30 p.m.  Voicemails left after 4:30 p.m. will not be returned until the following business day.   For prescription refill requests, have your pharmacy contact our office.         Resources For Cancer Patients and their Caregivers ? American Cancer Society: Can assist with transportation, wigs, general needs, runs Look Good Feel Better.        (419)593-7239 ? Cancer Care: Provides financial assistance, online support groups, medication/co-pay assistance.  1-800-813-HOPE 5082252246) ? [place] Assists [place] cancer patients and their families through emotional , educational and financial support.  306-087-8588 ? [place] DSS Where to apply for food stamps, Medicaid and utility assistance. 307-851-0260 ? [facility]: Transportation to medical appointments. 252-740-1803 ? Social Security Administration: May apply for disability if have a Stage IV cancer. ([phone] 596-1099 847-212-2558 ? [employer], Disability and Transit Services: Assists with nutrition, care and transit needs. (984) 127-7461

## 2016-02-02 ENCOUNTER — Encounter (HOSPITAL_COMMUNITY): Payer: Self-pay

## 2016-02-13 DIAGNOSIS — Z23 Encounter for immunization: Secondary | ICD-10-CM | POA: Diagnosis not present

## 2016-02-13 DIAGNOSIS — L821 Other seborrheic keratosis: Secondary | ICD-10-CM | POA: Diagnosis not present

## 2016-02-13 DIAGNOSIS — L570 Actinic keratosis: Secondary | ICD-10-CM | POA: Diagnosis not present

## 2016-02-13 DIAGNOSIS — D485 Neoplasm of uncertain behavior of skin: Secondary | ICD-10-CM | POA: Diagnosis not present

## 2016-03-09 DIAGNOSIS — R809 Proteinuria, unspecified: Secondary | ICD-10-CM | POA: Diagnosis not present

## 2016-03-09 DIAGNOSIS — Z79899 Other long term (current) drug therapy: Secondary | ICD-10-CM | POA: Diagnosis not present

## 2016-03-09 DIAGNOSIS — N183 Chronic kidney disease, stage 3 (moderate): Secondary | ICD-10-CM | POA: Diagnosis not present

## 2016-03-09 DIAGNOSIS — I10 Essential (primary) hypertension: Secondary | ICD-10-CM | POA: Diagnosis not present

## 2016-03-09 DIAGNOSIS — D509 Iron deficiency anemia, unspecified: Secondary | ICD-10-CM | POA: Diagnosis not present

## 2016-03-09 DIAGNOSIS — E559 Vitamin D deficiency, unspecified: Secondary | ICD-10-CM | POA: Diagnosis not present

## 2016-03-17 DIAGNOSIS — N2581 Secondary hyperparathyroidism of renal origin: Secondary | ICD-10-CM | POA: Diagnosis not present

## 2016-03-17 DIAGNOSIS — N183 Chronic kidney disease, stage 3 (moderate): Secondary | ICD-10-CM | POA: Diagnosis not present

## 2016-05-18 ENCOUNTER — Ambulatory Visit (INDEPENDENT_AMBULATORY_CARE_PROVIDER_SITE_OTHER): Payer: Medicare Other | Admitting: Urology

## 2016-05-18 DIAGNOSIS — N401 Enlarged prostate with lower urinary tract symptoms: Secondary | ICD-10-CM

## 2016-06-01 ENCOUNTER — Other Ambulatory Visit (HOSPITAL_COMMUNITY): Payer: BC Managed Care – PPO

## 2016-06-01 ENCOUNTER — Ambulatory Visit (HOSPITAL_COMMUNITY): Payer: BC Managed Care – PPO | Admitting: Hematology & Oncology

## 2016-06-03 DIAGNOSIS — D1800 Hemangioma unspecified site: Secondary | ICD-10-CM | POA: Diagnosis not present

## 2016-06-03 DIAGNOSIS — L821 Other seborrheic keratosis: Secondary | ICD-10-CM | POA: Diagnosis not present

## 2016-06-03 DIAGNOSIS — Z872 Personal history of diseases of the skin and subcutaneous tissue: Secondary | ICD-10-CM | POA: Diagnosis not present

## 2016-06-03 DIAGNOSIS — D225 Melanocytic nevi of trunk: Secondary | ICD-10-CM | POA: Diagnosis not present

## 2016-07-12 DIAGNOSIS — R809 Proteinuria, unspecified: Secondary | ICD-10-CM | POA: Diagnosis not present

## 2016-07-12 DIAGNOSIS — D649 Anemia, unspecified: Secondary | ICD-10-CM | POA: Diagnosis not present

## 2016-07-12 DIAGNOSIS — Z79899 Other long term (current) drug therapy: Secondary | ICD-10-CM | POA: Diagnosis not present

## 2016-07-12 DIAGNOSIS — E559 Vitamin D deficiency, unspecified: Secondary | ICD-10-CM | POA: Diagnosis not present

## 2016-07-12 DIAGNOSIS — N183 Chronic kidney disease, stage 3 (moderate): Secondary | ICD-10-CM | POA: Diagnosis not present

## 2016-07-12 DIAGNOSIS — N400 Enlarged prostate without lower urinary tract symptoms: Secondary | ICD-10-CM | POA: Diagnosis not present

## 2016-07-23 ENCOUNTER — Other Ambulatory Visit: Payer: Self-pay

## 2016-07-27 ENCOUNTER — Encounter (HOSPITAL_COMMUNITY): Payer: Medicare Other | Attending: Hematology & Oncology | Admitting: Hematology & Oncology

## 2016-07-27 ENCOUNTER — Encounter (HOSPITAL_COMMUNITY): Payer: Self-pay | Admitting: Hematology & Oncology

## 2016-07-27 ENCOUNTER — Encounter (HOSPITAL_COMMUNITY): Payer: Medicare Other

## 2016-07-27 VITALS — BP 131/74 | HR 81 | Temp 97.5°F | Resp 16 | Ht 70.0 in | Wt 222.4 lb

## 2016-07-27 DIAGNOSIS — D472 Monoclonal gammopathy: Secondary | ICD-10-CM

## 2016-07-27 DIAGNOSIS — N183 Chronic kidney disease, stage 3 unspecified: Secondary | ICD-10-CM

## 2016-07-27 LAB — COMPREHENSIVE METABOLIC PANEL
ALBUMIN: 4.5 g/dL (ref 3.5–5.0)
ALT: 20 U/L (ref 17–63)
AST: 28 U/L (ref 15–41)
Alkaline Phosphatase: 77 U/L (ref 38–126)
Anion gap: 9 (ref 5–15)
BUN: 18 mg/dL (ref 6–20)
CHLORIDE: 102 mmol/L (ref 101–111)
CO2: 30 mmol/L (ref 22–32)
CREATININE: 1.67 mg/dL — AB (ref 0.61–1.24)
Calcium: 9.4 mg/dL (ref 8.9–10.3)
GFR calc non Af Amer: 39 mL/min — ABNORMAL LOW (ref >60)
GFR, EST AFRICAN AMERICAN: 45 mL/min — AB (ref >60)
Glucose, Bld: 141 mg/dL — ABNORMAL HIGH (ref 65–99)
Potassium: 3.4 mmol/L — ABNORMAL LOW (ref 3.5–5.1)
SODIUM: 141 mmol/L (ref 135–145)
Total Bilirubin: 0.5 mg/dL (ref 0.3–1.2)
Total Protein: 7.7 g/dL (ref 6.5–8.1)

## 2016-07-27 LAB — CBC WITH DIFFERENTIAL/PLATELET
BASOS ABS: 0 10*3/uL (ref 0.0–0.1)
BASOS PCT: 1 %
EOS ABS: 0.8 10*3/uL — AB (ref 0.0–0.7)
EOS PCT: 9 %
HCT: 44 % (ref 39.0–52.0)
Hemoglobin: 14.7 g/dL (ref 13.0–17.0)
Lymphocytes Relative: 31 %
Lymphs Abs: 2.5 10*3/uL (ref 0.7–4.0)
MCH: 34.8 pg — ABNORMAL HIGH (ref 26.0–34.0)
MCHC: 33.4 g/dL (ref 30.0–36.0)
MCV: 104.3 fL — ABNORMAL HIGH (ref 78.0–100.0)
Monocytes Absolute: 0.5 10*3/uL (ref 0.1–1.0)
Monocytes Relative: 7 %
Neutro Abs: 4.2 10*3/uL (ref 1.7–7.7)
Neutrophils Relative %: 52 %
PLATELETS: 274 10*3/uL (ref 150–400)
RBC: 4.22 MIL/uL (ref 4.22–5.81)
RDW: 13.6 % (ref 11.5–15.5)
WBC: 8 10*3/uL (ref 4.0–10.5)

## 2016-07-27 NOTE — Progress Notes (Signed)
[facility] NOTE  Patient Care Team: Celene Squibb, MD as PCP - General (Internal Medicine) Daneil Dolin, MD as Attending Physician (Gastroenterology) Fran Lowes, MD as Consulting Physician (Nephrology) Franchot Gallo, MD as Consulting Physician (Urology)  CHIEF COMPLAINTS/PURPOSE OF CONSULTATION:  MGUS Elevated kappa/lambda ratio of 5.07 Stage III CKD Normal 24 hour urine protein electrophoresis Bone survey on 09/2014 WNL BMBX on 01/13/2016 with plasma cells at 7% with a POLYCLONAL staining pattern  HISTORY OF PRESENTING ILLNESS:  [name] 75 y.o. male is here because of MGUS. He has CKD. He has had a BMBX.  Patient is accompanied by wife He is doing well and says he feels great after having done the bone marrow biopsy. Appetite is excellent. No new pain. He is active.  Patient's PCP is Dr. Delphina Cahill. He is up to date on screening studies. He continues to follow with Dr. Burnett Sheng.    MEDICAL HISTORY:  Past Medical History:  Diagnosis Date  . Asthma   . Blood transfusion abn reaction or complication, no procedure mishap 1979   reaction due to wrong blood type given  . GERD (gastroesophageal reflux disease)    occasional  . H/O: pneumonia 02/27/2015  . Hyperlipidemia   . Hypertension   . Hypothyroidism   . Myocardial infarction (HCC)    hx of abnormal ekg showed prior myocardial infarction  . Renal disorder     SURGICAL HISTORY: Past Surgical History:  Procedure Laterality Date  . APPENDECTOMY  1963  . CHOLECYSTECTOMY  1979  . COLONOSCOPY  03/28/2008   CWU:GQBVQX rectum; left-sided transverse diverticula diminutive polyp; descending colon. Adenomas.  . COLONOSCOPY N/A 06/07/2013   Procedure: COLONOSCOPY;  Surgeon: Daneil Dolin, MD;  Location: [facility];  Service: Endoscopy;  Laterality: N/A;  9:45  . HERNIA REPAIR  2005  . LUMBAR LAMINECTOMY/DECOMPRESSION MICRODISCECTOMY  07/26/2012   Procedure: LUMBAR  LAMINECTOMY/DECOMPRESSION MICRODISCECTOMY;  Surgeon: Johnn Hai, MD;  Location: [facility];  Service: Orthopedics;  Laterality: N/A;  Lumbar Decompression L4-L5  . STOMACH SURGERY  2003   exploratory surgery, fatty tumors with obstruction?  . TONSILLECTOMY  1962    SOCIAL HISTORY: Social History   Social History  . Marital status: Married    Spouse name: N/A  . Number of children: N/A  . Years of education: N/A   Occupational History  . Not on file.   Social History Main Topics  . Smoking status: Never Smoker  . Smokeless tobacco: Never Used  . Alcohol use Yes     Comment: occasional  . Drug use: No  . Sexual activity: No   Other Topics Concern  . Not on file   Social History Narrative  . 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 His son is also a Emergency planning/management officer.  FAMILY HISTORY: Family History  Problem Relation Age of Onset  . Thyroid disease Mother   . Heart disease Mother   . Hypertension Mother   . Clotting disorder Father   . Hypertension Father   . Cancer Sister   . Hypertension Sister   . Colon cancer Neg Hx    indicated that his mother is deceased. He indicated that his father is alive. He indicated that one of his two sisters is deceased. He indicated that his brother is deceased. He indicated that the status of his neg hx is unknown.  Mother deceased at 14 yo from heart attack. 5 siblings. He has multiple uncles who have passed from cancer  ALLERGIES:  is allergic to meperidine and related and sulfa antibiotics.  MEDICATIONS:  Current Outpatient Prescriptions  Medication Sig Dispense Refill  . acetaminophen (TYLENOL) 500 MG tablet Take 500 mg by mouth every 6 (six) hours as needed.    Marland Kitchen ADVAIR DISKUS 100-50 MCG/DOSE AEPB Inhale 1 Dose into the lungs as needed.  2  . albuterol (PROVENTIL) (2.5 MG/3ML) 0.083% nebulizer solution Take 2.5 mg by nebulization every 6 (six) hours as needed. Reported on  12/03/2015    . amLODipine (NORVASC) 10 MG tablet Take 10 mg by mouth daily.    Marland Kitchen aspirin EC 81 MG tablet Take 81 mg by mouth daily.    . cholecalciferol (VITAMIN D) 1000 UNITS tablet Take 1,000 Units by mouth daily.    Marland Kitchen levalbuterol (XOPENEX) 1.25 MG/3ML nebulizer solution Inhale 1.25 mg into the lungs 3 (three) times daily as needed. Reported on 12/03/2015    . levothyroxine (SYNTHROID, LEVOTHROID) 75 MCG tablet Take 75 mcg by mouth at bedtime.    . simvastatin (ZOCOR) 40 MG tablet Take 40 mg by mouth at bedtime.    . tamsulosin (FLOMAX) 0.4 MG CAPS capsule Take 0.4 mg by mouth daily.    Marland Kitchen triamterene-hydrochlorothiazide (DYAZIDE) 37.5-25 MG per capsule Take 1 capsule by mouth every other day. Every other day     No current facility-administered medications for this visit.     Review of Systems  Constitutional: Negative for fever, chills, weight loss and malaise/fatigue.  HENT: Negative for congestion, hearing loss, nosebleeds, sore throat and tinnitus.   Eyes: Negative for blurred vision, double vision, pain and discharge.  Respiratory:  Negative for cough, wheezing, hemoptysis, sputum production and shortness of breath. Cardiovascular: Negative for chest pain, palpitations, claudication, leg swelling and PND.  Gastrointestinal: Negative for heartburn, nausea, vomiting, abdominal pain, diarrhea, constipation, blood in stool and melena.  Genitourinary: Negative for dysuria, urgency, frequency and hematuria.  Musculoskeletal: Negative for myalgias, joint pain and falls.  Skin: Negative for itching and rash.  Neurological: Negative for dizziness, tingling, tremors, sensory change, speech change, focal weakness, seizures, loss of consciousness, weakness and headaches.  Endo/Heme/Allergies: Does not bruise/bleed easily.  Psychiatric/Behavioral: Negative for depression, suicidal ideas, memory loss and substance abuse. The patient is not nervous/anxious and does not have insomnia.   14 point  review of systems was performed and is negative except as detailed under history of present illness and above    PHYSICAL EXAMINATION:  ECOG PERFORMANCE STATUS: 0 - Asymptomatic  Vitals:   07/27/16 1311  BP: 131/74  Pulse: 81  Resp: 16  Temp: 97.5 F (36.4 C)   Filed Weights   07/27/16 1311  Weight: 222 lb 6.4 oz (100.9 kg)    Physical Exam  Constitutional: He is oriented to person, place, and time and well-developed, well-nourished, and in no distress.  HENT:  Head: Normocephalic and atraumatic.  Nose: Nose normal.  Mouth/Throat: Oropharynx is clear and moist. No oropharyngeal exudate.  Eyes: Conjunctivae and EOM are normal. Pupils are equal, round, and reactive to light. Right eye exhibits no discharge. Left eye exhibits no discharge. No scleral icterus.  Neck: Normal range of motion. Neck supple. No tracheal deviation present. No thyromegaly present.  Cardiovascular: Normal rate, regular rhythm and normal heart sounds.  Exam reveals no gallop and no friction rub.   No murmur heard. Pulmonary/Chest: Effort normal.  He has no rales.  Abdominal: Soft. Bowel sounds are normal. He exhibits no distension and no mass. There is no tenderness. There is no rebound and no guarding.  Musculoskeletal: Normal range of motion. He exhibits no edema.  Lymphadenopathy:    He has no cervical adenopathy.  Neurological: He is alert and oriented to person, place, and time. He has normal reflexes. No cranial nerve deficit. Gait normal. Coordination normal.  Skin: Skin is warm and dry. No rash noted.  Psychiatric: Mood, memory, affect and judgment normal.  Nursing note and vitals reviewed.   LABORATORY DATA:  I have reviewed the data as listed  Results for RYDEN, WAINER (MRN [id number]) as of 07/27/2016 12:57  Ref. Range 07/27/2016 12:28  WBC Latest Ref Range: 4.0 - 10.5 K/uL 8.0  RBC Latest Ref Range: 4.22 - 5.81 MIL/uL 4.22  Hemoglobin Latest Ref Range: 13.0 - 17.0 g/dL 14.7  HCT Latest  Ref Range: 39.0 - 52.0 % 44.0  MCV Latest Ref Range: 78.0 - 100.0 fL 104.3 (H)  MCH Latest Ref Range: 26.0 - 34.0 pg 34.8 (H)  MCHC Latest Ref Range: 30.0 - 36.0 g/dL 33.4  RDW Latest Ref Range: 11.5 - 15.5 % 13.6  Platelets Latest Ref Range: 150 - 400 K/uL 274  Neutrophils Latest Units: % 52  Lymphocytes Latest Units: % 31  Monocytes Relative Latest Units: % 7  Eosinophil Latest Units: % 9  Basophil Latest Units: % 1  NEUT# Latest Ref Range: 1.7 - 7.7 K/uL 4.2  Lymphocyte # Latest Ref Range: 0.7 - 4.0 K/uL 2.5  Monocyte # Latest Ref Range: 0.1 - 1.0 K/uL 0.5  Eosinophils Absolute Latest Ref Range: 0.0 - 0.7 K/uL 0.8 (H)  Basophils Absolute Latest Ref Range: 0.0 - 0.1 K/uL 0.0    Pathology:    ASSESSMENT & PLAN:  MGUS Elevated kappa/lambda ratio of 5.07 Stage III CKD Bone survey on 09/2014 WNL  MGUS with BMBX showing 7% plasma cells but they are POLYCLONAL. I have recommended at least yearly follow-up to which he agrees.    I told Dominik that if it is easier for him to have his PCP monitor his labs then we could discuss that with Dr. Nevada Crane. However, patient says that he would prefer being monitored here.   Follow up in one year.  Orders Placed This Encounter  Procedures  . CBC with Differential    Standing Status:   Future    Standing Expiration Date:   07/27/2018  . Comprehensive metabolic panel    Standing Status:   Future    Standing Expiration Date:   07/27/2018  . Protein electrophoresis, serum    Standing Status:   Future    Standing Expiration Date:   07/27/2018  . Immunofixation electrophoresis    Standing Status:   Future    Standing Expiration Date:   07/27/2018  . IgG, IgA, IgM    Standing Status:   Future    Standing Expiration Date:   07/27/2018  . Kappa/lambda light chains    Standing Status:   Future    Standing Expiration Date:   07/27/2018    All questions were answered. The patient knows to call the clinic with any problems, questions or concerns.  This  document serves as a record of services personally performed by Ancil Linsey, MD. It was created on her behalf by Elmyra Ricks, a trained medical scribe. The creation of this record is based on the scribe's personal observations and the provider's statements to them. This document  has been checked and approved by the attending provider.  I have reviewed the above documentation for accuracy and completeness, and I agree with the above.  This note was electronically signed.    Molli Hazard, MD

## 2016-07-27 NOTE — Patient Instructions (Addendum)
[place] at [facility] Discharge Instructions  RECOMMENDATIONS MADE BY THE CONSULTANT AND ANY TEST RESULTS WILL BE SENT TO YOUR REFERRING PHYSICIAN.  You saw Dr. Whitney Muse today. Follow up in one year with Dr. Whitney Muse with lab work.  Thank you for choosing [place] at [facility] to provide your oncology and hematology care.  To afford each patient quality time with our provider, please arrive at least 15 minutes before your scheduled appointment time.   Beginning January 23rd 2017 lab work for the [facility] will be done in the  Main lab at [facility] on 1st floor. If you have a lab appointment with the [facility] please come in thru the  Main Entrance and check in at the main information desk  You need to re-schedule your appointment should you arrive 10 or more minutes late.  We strive to give you quality time with our providers, and arriving late affects you and other patients whose appointments are after yours.  Also, if you no show three or more times for appointments you may be dismissed from the clinic at the providers discretion.     Again, thank you for choosing [facility].  Our hope is that these requests will decrease the amount of time that you wait before being seen by our physicians.       _____________________________________________________________  Should you have questions after your visit to [facility], please contact our office at [phone] between the hours of 8:30 a.m. and 4:30 p.m.  Voicemails left after 4:30 p.m. will not be returned until the following business day.  For prescription refill requests, have your pharmacy contact our office.         Resources For Cancer Patients and their Caregivers ? American Cancer Society: Can assist with transportation, wigs, general needs, runs Look Good Feel Better.        414-762-2664 ? Cancer Care: Provides financial assistance,  online support groups, medication/co-pay assistance.  1-800-813-HOPE 240 834 8383) ? [place] Assists [place] cancer patients and their families through emotional , educational and financial support.  (320)774-9436 ? [place] DSS Where to apply for food stamps, Medicaid and utility assistance. (218)415-9835 ? RCATS: Transportation to medical appointments. 901-142-6818 ? Social Security Administration: May apply for disability if have a Stage IV cancer. (531)040-2120 (254)026-7377 ? [employer], Disability and Transit Services: Assists with nutrition, care and transit needs. [place] Support Programs: @10RELATIVEDAYS @ > Cancer Support Group  2nd Tuesday of the month 1pm-2pm, Journey Room  > Creative Journey  3rd Tuesday of the month 1130am-1pm, Journey Room  > Look Good Feel Better  1st Wednesday of the month 10am-12 noon, Journey Room (Call [place] to register [phone])

## 2016-07-28 DIAGNOSIS — E559 Vitamin D deficiency, unspecified: Secondary | ICD-10-CM | POA: Diagnosis not present

## 2016-07-28 DIAGNOSIS — I10 Essential (primary) hypertension: Secondary | ICD-10-CM | POA: Diagnosis not present

## 2016-07-28 DIAGNOSIS — N183 Chronic kidney disease, stage 3 (moderate): Secondary | ICD-10-CM | POA: Diagnosis not present

## 2016-07-28 LAB — PROTEIN ELECTROPHORESIS, SERUM
A/G Ratio: 1.3 (ref 0.7–1.7)
ALPHA-1-GLOBULIN: 0.2 g/dL (ref 0.0–0.4)
Albumin ELP: 4.1 g/dL (ref 2.9–4.4)
Alpha-2-Globulin: 0.8 g/dL (ref 0.4–1.0)
Beta Globulin: 1.1 g/dL (ref 0.7–1.3)
GAMMA GLOBULIN: 1 g/dL (ref 0.4–1.8)
GLOBULIN, TOTAL: 3.1 g/dL (ref 2.2–3.9)
TOTAL PROTEIN ELP: 7.2 g/dL (ref 6.0–8.5)

## 2016-07-28 LAB — IMMUNOFIXATION ELECTROPHORESIS
IGG (IMMUNOGLOBIN G), SERUM: 1071 mg/dL (ref 700–1600)
IgA: 194 mg/dL (ref 61–437)
IgM, Serum: 41 mg/dL (ref 15–143)
Total Protein ELP: 7.5 g/dL (ref 6.0–8.5)

## 2016-07-28 LAB — KAPPA/LAMBDA LIGHT CHAINS
KAPPA FREE LGHT CHN: 22.4 mg/L — AB (ref 3.3–19.4)
KAPPA, LAMDA LIGHT CHAIN RATIO: 1.66 — AB (ref 0.26–1.65)
Lambda free light chains: 13.5 mg/L (ref 5.7–26.3)

## 2016-07-28 LAB — IGG, IGA, IGM
IGA: 189 mg/dL (ref 61–437)
IGG (IMMUNOGLOBIN G), SERUM: 1046 mg/dL (ref 700–1600)
IgM, Serum: 42 mg/dL (ref 15–143)

## 2016-09-07 DIAGNOSIS — I482 Chronic atrial fibrillation: Secondary | ICD-10-CM | POA: Diagnosis not present

## 2016-09-07 DIAGNOSIS — N529 Male erectile dysfunction, unspecified: Secondary | ICD-10-CM | POA: Diagnosis not present

## 2016-09-07 DIAGNOSIS — I10 Essential (primary) hypertension: Secondary | ICD-10-CM | POA: Diagnosis not present

## 2016-09-07 DIAGNOSIS — E782 Mixed hyperlipidemia: Secondary | ICD-10-CM | POA: Diagnosis not present

## 2016-09-07 DIAGNOSIS — E039 Hypothyroidism, unspecified: Secondary | ICD-10-CM | POA: Diagnosis not present

## 2016-09-07 DIAGNOSIS — E785 Hyperlipidemia, unspecified: Secondary | ICD-10-CM | POA: Diagnosis not present

## 2016-09-07 DIAGNOSIS — R7301 Impaired fasting glucose: Secondary | ICD-10-CM | POA: Diagnosis not present

## 2016-09-07 DIAGNOSIS — E119 Type 2 diabetes mellitus without complications: Secondary | ICD-10-CM | POA: Diagnosis not present

## 2016-09-15 DIAGNOSIS — E782 Mixed hyperlipidemia: Secondary | ICD-10-CM | POA: Diagnosis not present

## 2016-09-15 DIAGNOSIS — I10 Essential (primary) hypertension: Secondary | ICD-10-CM | POA: Diagnosis not present

## 2016-09-15 DIAGNOSIS — E039 Hypothyroidism, unspecified: Secondary | ICD-10-CM | POA: Diagnosis not present

## 2016-09-15 DIAGNOSIS — N189 Chronic kidney disease, unspecified: Secondary | ICD-10-CM | POA: Diagnosis not present

## 2016-09-15 DIAGNOSIS — R7301 Impaired fasting glucose: Secondary | ICD-10-CM | POA: Diagnosis not present

## 2016-09-27 ENCOUNTER — Other Ambulatory Visit (HOSPITAL_COMMUNITY): Payer: Self-pay

## 2016-09-27 DIAGNOSIS — D472 Monoclonal gammopathy: Secondary | ICD-10-CM

## 2016-09-28 ENCOUNTER — Encounter (HOSPITAL_COMMUNITY): Payer: Medicare Other | Attending: Hematology & Oncology

## 2016-09-28 DIAGNOSIS — D472 Monoclonal gammopathy: Secondary | ICD-10-CM | POA: Diagnosis not present

## 2016-09-28 LAB — FOLATE: Folate: 8 ng/mL (ref >5.9)

## 2016-09-28 LAB — VITAMIN B12: VITAMIN B 12: 162 pg/mL — AB (ref 180–914)

## 2016-09-30 ENCOUNTER — Other Ambulatory Visit (HOSPITAL_COMMUNITY): Payer: Self-pay | Admitting: Oncology

## 2016-09-30 ENCOUNTER — Encounter (HOSPITAL_COMMUNITY): Payer: Self-pay | Admitting: Oncology

## 2016-09-30 ENCOUNTER — Encounter (HOSPITAL_COMMUNITY): Payer: Self-pay

## 2016-09-30 ENCOUNTER — Other Ambulatory Visit (HOSPITAL_COMMUNITY): Payer: Self-pay

## 2016-09-30 ENCOUNTER — Encounter (HOSPITAL_BASED_OUTPATIENT_CLINIC_OR_DEPARTMENT_OTHER): Payer: Medicare Other

## 2016-09-30 VITALS — BP 143/75 | HR 83 | Temp 97.6°F | Resp 16

## 2016-09-30 DIAGNOSIS — D472 Monoclonal gammopathy: Secondary | ICD-10-CM

## 2016-09-30 DIAGNOSIS — E538 Deficiency of other specified B group vitamins: Secondary | ICD-10-CM | POA: Diagnosis present

## 2016-09-30 HISTORY — DX: Deficiency of other specified B group vitamins: E53.8

## 2016-09-30 MED ORDER — CYANOCOBALAMIN 1000 MCG/ML IJ SOLN
1000.0000 ug | Freq: Once | INTRAMUSCULAR | Status: AC
  Administered 2016-09-30: 1000 ug via INTRAMUSCULAR

## 2016-09-30 MED ORDER — CYANOCOBALAMIN 1000 MCG/ML IJ SOLN
INTRAMUSCULAR | Status: AC
  Filled 2016-09-30: qty 1

## 2016-09-30 NOTE — Progress Notes (Signed)
Cira Servant presents today for injection per MD orders. B12 1,027mcg administered IM in left Upper Arm. Administration without incident. Patient tolerated well.  Vitals stable, discharged from clinic ambulatory, patient has a schedule for his appointments for B12.

## 2016-09-30 NOTE — Patient Instructions (Signed)
[facility] at [facility] Discharge Instructions  RECOMMENDATIONS MADE BY THE CONSULTANT AND ANY TEST RESULTS WILL BE SENT TO YOUR REFERRING PHYSICIAN.  B12 injection given today. Follow up as scheduled.  Thank you for choosing [facility] at [facility] to provide your oncology and hematology care.  To afford each patient quality time with our provider, please arrive at least 15 minutes before your scheduled appointment time.   Beginning January 23rd 2017 lab work for the Cancer Center will be done in the  Main lab at [facility] on 1st floor. If you have a lab appointment with the Cancer Center please come in thru the  Main Entrance and check in at the main information desk  You need to re-schedule your appointment should you arrive 10 or more minutes late.  We strive to give you quality time with our providers, and arriving late affects you and other patients whose appointments are after yours.  Also, if you no show three or more times for appointments you may be dismissed from the clinic at the providers discretion.     Again, thank you for choosing [facility].  Our hope is that these requests will decrease the amount of time that you wait before being seen by our physicians.       _____________________________________________________________  Should you have questions after your visit to [facility], please contact our office at [phone] between the hours of 8:30 a.m. and 4:30 p.m.  Voicemails left after 4:30 p.m. will not be returned until the following business day.  For prescription refill requests, have your pharmacy contact our office.         Resources For Cancer Patients and their Caregivers ? American Cancer Society: Can assist with transportation, wigs, general needs, runs Look Good Feel Better.        1-888-227-6333 ? Cancer Care: Provides financial assistance, online support groups,  medication/co-pay assistance.  1-800-813-HOPE (4673) ? [facility] Assists [place] cancer patients and their families through emotional , educational and financial support.  336-427-4357 ? [place] DSS Where to apply for food stamps, Medicaid and utility assistance. 336-342-1394 ? RCATS: Transportation to medical appointments. 336-347-2287 ? Social Security Administration: May apply for disability if have a Stage IV cancer. 336-342-7796 1-800-772-1213 ? [place] Aging, Disability and Transit Services: Assists with nutrition, care and transit needs. 336-349-2343  Cancer Center Support Programs: @10RELATIVEDAYS@ > Cancer Support Group  2nd Tuesday of the month 1pm-2pm, Journey Room  > Creative Journey  3rd Tuesday of the month 1130am-1pm, Journey Room  > Look Good Feel Better  1st Wednesday of the month 10am-12 noon, Journey Room (Call American Cancer Society to register [phone])   

## 2016-10-26 ENCOUNTER — Encounter (HOSPITAL_COMMUNITY): Payer: Medicare Other | Attending: Hematology & Oncology

## 2016-10-26 VITALS — BP 139/67 | HR 62 | Temp 97.6°F | Resp 18

## 2016-10-26 DIAGNOSIS — E538 Deficiency of other specified B group vitamins: Secondary | ICD-10-CM

## 2016-10-26 DIAGNOSIS — D472 Monoclonal gammopathy: Secondary | ICD-10-CM | POA: Insufficient documentation

## 2016-10-26 MED ORDER — CYANOCOBALAMIN 1000 MCG/ML IJ SOLN
INTRAMUSCULAR | Status: AC
  Filled 2016-10-26: qty 1

## 2016-10-26 MED ORDER — CYANOCOBALAMIN 1000 MCG/ML IJ SOLN
1000.0000 ug | Freq: Once | INTRAMUSCULAR | Status: AC
  Administered 2016-10-26: 1000 ug via INTRAMUSCULAR

## 2016-10-26 NOTE — Patient Instructions (Signed)
[place] at [facility] Discharge Instructions  RECOMMENDATIONS MADE BY THE CONSULTANT AND ANY TEST RESULTS WILL BE SENT TO YOUR REFERRING PHYSICIAN.  Vitamin B12 1000 mcg injection week 1 of 4 given as ordered. Return as scheduled next week for B12 injection week 2 of 4.  Thank you for choosing [place] at [facility] to provide your oncology and hematology care.  To afford each patient quality time with our provider, please arrive at least 15 minutes before your scheduled appointment time.   Beginning January 23rd 2017 lab work for the [facility] will be done in the  Main lab at [facility] on 1st floor. If you have a lab appointment with the [facility] please come in thru the  Main Entrance and check in at the main information desk  You need to re-schedule your appointment should you arrive 10 or more minutes late.  We strive to give you quality time with our providers, and arriving late affects you and other patients whose appointments are after yours.  Also, if you no show three or more times for appointments you may be dismissed from the clinic at the providers discretion.     Again, thank you for choosing [facility].  Our hope is that these requests will decrease the amount of time that you wait before being seen by our physicians.       _____________________________________________________________  Should you have questions after your visit to [facility], please contact our office at [phone] between the hours of 8:30 a.m. and 4:30 p.m.  Voicemails left after 4:30 p.m. will not be returned until the following business day.  For prescription refill requests, have your pharmacy contact our office.         Resources For Cancer Patients and their Caregivers ? American Cancer Society: Can assist with transportation, wigs, general needs, runs Look Good Feel Better.        (480)640-7814 ? Cancer  Care: Provides financial assistance, online support groups, medication/co-pay assistance.  1-800-813-HOPE 680-845-6297) ? [place] Assists [place] cancer patients and their families through emotional , educational and financial support.  367-868-0055 ? [place] DSS Where to apply for food stamps, Medicaid and utility assistance. 614-254-9287 ? RCATS: Transportation to medical appointments. 631 354 3108 ? Social Security Administration: May apply for disability if have a Stage IV cancer. 220-877-8427 [phone])871-0028 ? [employer], Disability and Transit Services: Assists with nutrition, care and transit needs. [place] Support Programs: @10RELATIVEDAYS @ > Cancer Support Group  2nd Tuesday of the month 1pm-2pm, Journey Room  > Creative Journey  3rd Tuesday of the month 1130am-1pm, Journey Room  > Look Good Feel Better  1st Wednesday of the month 10am-12 noon, Journey Room (Call [place] to register [phone])

## 2016-10-26 NOTE — Progress Notes (Signed)
[name] presents today for injection per MD orders. B12 1000mcg administered IM in left Upper Arm. Administration without incident. Patient tolerated well.  

## 2016-10-27 LAB — ANTI-PARIETAL ANTIBODY: Parietal Cell Antibody-IgG: 3.4 Units (ref 0.0–20.0)

## 2016-10-27 LAB — INTRINSIC FACTOR ANTIBODIES: INTRINSIC FACTOR: 1 [AU]/ml (ref 0.0–1.1)

## 2016-11-02 ENCOUNTER — Encounter (HOSPITAL_BASED_OUTPATIENT_CLINIC_OR_DEPARTMENT_OTHER): Payer: Medicare Other

## 2016-11-02 ENCOUNTER — Encounter (HOSPITAL_COMMUNITY): Payer: Self-pay

## 2016-11-02 VITALS — BP 120/71 | HR 71 | Temp 97.7°F | Resp 18

## 2016-11-02 DIAGNOSIS — E538 Deficiency of other specified B group vitamins: Secondary | ICD-10-CM

## 2016-11-02 MED ORDER — CYANOCOBALAMIN 1000 MCG/ML IJ SOLN
1000.0000 ug | Freq: Once | INTRAMUSCULAR | Status: AC
  Administered 2016-11-02: 1000 ug via INTRAMUSCULAR
  Filled 2016-11-02: qty 1

## 2016-11-02 NOTE — Progress Notes (Signed)
[name] presents today for injection per MD orders. B12 1000mcg administered IM in left Upper Arm. Administration without incident. Patient tolerated well.  

## 2016-11-02 NOTE — Patient Instructions (Signed)
[facility] at [facility] Discharge Instructions  RECOMMENDATIONS MADE BY THE CONSULTANT AND ANY TEST RESULTS WILL BE SENT TO YOUR REFERRING PHYSICIAN.  B12 injection  Thank you for choosing [facility] at [facility] to provide your oncology and hematology care.  To afford each patient quality time with our provider, please arrive at least 15 minutes before your scheduled appointment time.   Beginning January 23rd 2017 lab work for the Cancer Center will be done in the  Main lab at [facility] on 1st floor. If you have a lab appointment with the Cancer Center please come in thru the  Main Entrance and check in at the main information desk  You need to re-schedule your appointment should you arrive 10 or more minutes late.  We strive to give you quality time with our providers, and arriving late affects you and other patients whose appointments are after yours.  Also, if you no show three or more times for appointments you may be dismissed from the clinic at the providers discretion.     Again, thank you for choosing [facility].  Our hope is that these requests will decrease the amount of time that you wait before being seen by our physicians.       _____________________________________________________________  Should you have questions after your visit to [facility], please contact our office at [phone] between the hours of 8:30 a.m. and 4:30 p.m.  Voicemails left after 4:30 p.m. will not be returned until the following business day.  For prescription refill requests, have your pharmacy contact our office.         Resources For Cancer Patients and their Caregivers ? American Cancer Society: Can assist with transportation, wigs, general needs, runs Look Good Feel Better.        1-888-227-6333 ? Cancer Care: Provides financial assistance, online support groups, medication/co-pay assistance.  1-800-813-HOPE  (4673) ? [facility] Assists [place] cancer patients and their families through emotional , educational and financial support.  336-427-4357 ? [place] DSS Where to apply for food stamps, Medicaid and utility assistance. 336-342-1394 ? RCATS: Transportation to medical appointments. 336-347-2287 ? Social Security Administration: May apply for disability if have a Stage IV cancer. 336-342-7796 1-800-772-1213 ? [place] Aging, Disability and Transit Services: Assists with nutrition, care and transit needs. 336-349-2343  Cancer Center Support Programs: @10RELATIVEDAYS@ > Cancer Support Group  2nd Tuesday of the month 1pm-2pm, Journey Room  > Creative Journey  3rd Tuesday of the month 1130am-1pm, Journey Room  > Look Good Feel Better  1st Wednesday of the month 10am-12 noon, Journey Room (Call American Cancer [employer] to register 1-800-395-5775)   

## 2016-11-09 ENCOUNTER — Ambulatory Visit (HOSPITAL_COMMUNITY): Payer: BC Managed Care – PPO

## 2016-11-10 ENCOUNTER — Encounter (HOSPITAL_COMMUNITY): Payer: Self-pay

## 2016-11-10 ENCOUNTER — Encounter (HOSPITAL_BASED_OUTPATIENT_CLINIC_OR_DEPARTMENT_OTHER): Payer: Medicare Other

## 2016-11-10 VITALS — BP 148/70 | HR 81 | Temp 97.8°F | Resp 18

## 2016-11-10 DIAGNOSIS — E538 Deficiency of other specified B group vitamins: Secondary | ICD-10-CM | POA: Diagnosis present

## 2016-11-10 MED ORDER — CYANOCOBALAMIN 1000 MCG/ML IJ SOLN
INTRAMUSCULAR | Status: AC
  Filled 2016-11-10: qty 1

## 2016-11-10 MED ORDER — CYANOCOBALAMIN 1000 MCG/ML IJ SOLN
1000.0000 ug | Freq: Once | INTRAMUSCULAR | Status: AC
  Administered 2016-11-10: 1000 ug via INTRAMUSCULAR

## 2016-11-10 NOTE — Progress Notes (Signed)
[name] presents today for injection per MD orders. B12 1075mcg administered IM in left Upper Arm. Administration without incident. Patient tolerated well. VS stable, patient d/c from clinic ambulatory, follow up as scheduled.

## 2016-11-10 NOTE — Patient Instructions (Signed)
[place] at [facility] Discharge Instructions  RECOMMENDATIONS MADE BY THE CONSULTANT AND ANY TEST RESULTS WILL BE SENT TO YOUR REFERRING PHYSICIAN.  B12 injection today Follow up next week then monthly as scheduled.  Thank you for choosing [place] at [facility] to provide your oncology and hematology care.  To afford each patient quality time with our provider, please arrive at least 15 minutes before your scheduled appointment time.   Beginning January 23rd 2017 lab work for the [facility] will be done in the  Main lab at [facility] on 1st floor. If you have a lab appointment with the [facility] please come in thru the  Main Entrance and check in at the main information desk  You need to re-schedule your appointment should you arrive 10 or more minutes late.  We strive to give you quality time with our providers, and arriving late affects you and other patients whose appointments are after yours.  Also, if you no show three or more times for appointments you may be dismissed from the clinic at the providers discretion.     Again, thank you for choosing [facility].  Our hope is that these requests will decrease the amount of time that you wait before being seen by our physicians.       _____________________________________________________________  Should you have questions after your visit to [facility], please contact our office at [phone] between the hours of 8:30 a.m. and 4:30 p.m.  Voicemails left after 4:30 p.m. will not be returned until the following business day.  For prescription refill requests, have your pharmacy contact our office.         Resources For Cancer Patients and their Caregivers ? American Cancer Society: Can assist with transportation, wigs, general needs, runs Look Good Feel Better.        ([phone] [phone] ? Cancer Care: Provides financial assistance, online support  groups, medication/co-pay assistance.  1-800-813-HOPE 6153137413) ? [facility] Assists [place] cancer patients and their families through emotional , educational and financial support.  336-673-6549 ? [place] DSS Where to apply for food stamps, Medicaid and utility assistance. 671-334-7468 ? RCATS: Transportation to medical appointments. 551-082-7004 ? Social Security Administration: May apply for disability if have a Stage IV cancer. 340-220-4699 (801) 261-9179 ? [employer], Disability and Transit Services: Assists with nutrition, care and transit needs. [place] Support Programs: @10RELATIVEDAYS @ > Cancer Support Group  2nd Tuesday of the month 1pm-2pm, Journey Room  > Creative Journey  3rd Tuesday of the month 1130am-1pm, Journey Room  > Look Good Feel Better  1st Wednesday of the month 10am-12 noon, Journey Room (Call [facility] to register [phone])

## 2016-11-19 ENCOUNTER — Encounter (HOSPITAL_BASED_OUTPATIENT_CLINIC_OR_DEPARTMENT_OTHER): Payer: Medicare Other

## 2016-11-19 VITALS — BP 129/65 | HR 61 | Temp 97.8°F | Resp 18

## 2016-11-19 DIAGNOSIS — E538 Deficiency of other specified B group vitamins: Secondary | ICD-10-CM

## 2016-11-19 MED ORDER — CYANOCOBALAMIN 1000 MCG/ML IJ SOLN
1000.0000 ug | Freq: Once | INTRAMUSCULAR | Status: AC
  Administered 2016-11-19: 1000 ug via INTRAMUSCULAR

## 2016-11-19 MED ORDER — CYANOCOBALAMIN 1000 MCG/ML IJ SOLN
INTRAMUSCULAR | Status: AC
Start: 2016-11-19 — End: 2016-11-19
  Filled 2016-11-19: qty 1

## 2016-11-19 NOTE — Progress Notes (Signed)
[name] presents today for injection per MD orders. B12 1000mcg administered IM in left Upper Arm. Administration without incident. Patient tolerated well.  

## 2016-11-29 DIAGNOSIS — N183 Chronic kidney disease, stage 3 (moderate): Secondary | ICD-10-CM | POA: Diagnosis not present

## 2016-11-29 DIAGNOSIS — R509 Fever, unspecified: Secondary | ICD-10-CM | POA: Diagnosis not present

## 2016-11-29 DIAGNOSIS — D649 Anemia, unspecified: Secondary | ICD-10-CM | POA: Diagnosis not present

## 2016-11-29 DIAGNOSIS — I10 Essential (primary) hypertension: Secondary | ICD-10-CM | POA: Diagnosis not present

## 2016-11-29 DIAGNOSIS — Z79899 Other long term (current) drug therapy: Secondary | ICD-10-CM | POA: Diagnosis not present

## 2016-11-30 DIAGNOSIS — D485 Neoplasm of uncertain behavior of skin: Secondary | ICD-10-CM | POA: Diagnosis not present

## 2016-11-30 DIAGNOSIS — L821 Other seborrheic keratosis: Secondary | ICD-10-CM | POA: Diagnosis not present

## 2016-11-30 DIAGNOSIS — L570 Actinic keratosis: Secondary | ICD-10-CM | POA: Diagnosis not present

## 2016-11-30 DIAGNOSIS — D225 Melanocytic nevi of trunk: Secondary | ICD-10-CM | POA: Diagnosis not present

## 2016-11-30 DIAGNOSIS — D2230 Melanocytic nevi of unspecified part of face: Secondary | ICD-10-CM | POA: Diagnosis not present

## 2016-12-01 DIAGNOSIS — D225 Melanocytic nevi of trunk: Secondary | ICD-10-CM | POA: Diagnosis not present

## 2016-12-06 DIAGNOSIS — N2581 Secondary hyperparathyroidism of renal origin: Secondary | ICD-10-CM | POA: Diagnosis not present

## 2016-12-06 DIAGNOSIS — N183 Chronic kidney disease, stage 3 (moderate): Secondary | ICD-10-CM | POA: Diagnosis not present

## 2016-12-06 DIAGNOSIS — I10 Essential (primary) hypertension: Secondary | ICD-10-CM | POA: Diagnosis not present

## 2016-12-20 ENCOUNTER — Encounter (HOSPITAL_COMMUNITY): Payer: Medicare Other | Attending: Hematology & Oncology

## 2016-12-20 ENCOUNTER — Other Ambulatory Visit (HOSPITAL_COMMUNITY): Payer: Self-pay | Admitting: Adult Health

## 2016-12-20 VITALS — BP 153/76 | HR 90 | Temp 98.1°F | Resp 18

## 2016-12-20 DIAGNOSIS — E538 Deficiency of other specified B group vitamins: Secondary | ICD-10-CM | POA: Diagnosis present

## 2016-12-20 DIAGNOSIS — D472 Monoclonal gammopathy: Secondary | ICD-10-CM | POA: Insufficient documentation

## 2016-12-20 MED ORDER — CYANOCOBALAMIN 1000 MCG/ML IJ SOLN
1000.0000 ug | Freq: Once | INTRAMUSCULAR | Status: AC
  Administered 2016-12-20: 1000 ug via INTRAMUSCULAR

## 2016-12-20 MED ORDER — CYANOCOBALAMIN 1000 MCG/ML IJ SOLN
INTRAMUSCULAR | Status: AC
  Filled 2016-12-20: qty 1

## 2016-12-20 NOTE — Progress Notes (Signed)
[name] tolerated Vit B12 injection well without complaints or incident. VSS Pt discharged self ambulatory in satisfactory condition

## 2016-12-20 NOTE — Patient Instructions (Signed)
[facility] at [facility] Discharge Instructions  RECOMMENDATIONS MADE BY THE CONSULTANT AND ANY TEST RESULTS WILL BE SENT TO YOUR REFERRING PHYSICIAN.  Received Vit B12 injection today. Follow-up as scheduled. Call clinic for any questions or concerns  Thank you for choosing [facility] at [facility] to provide your oncology and hematology care.  To afford each patient quality time with our provider, please arrive at least 15 minutes before your scheduled appointment time.    If you have a lab appointment with the Cancer Center please come in thru the  Main Entrance and check in at the main information desk  You need to re-schedule your appointment should you arrive 10 or more minutes late.  We strive to give you quality time with our providers, and arriving late affects you and other patients whose appointments are after yours.  Also, if you no show three or more times for appointments you may be dismissed from the clinic at the providers discretion.     Again, thank you for choosing [facility].  Our hope is that these requests will decrease the amount of time that you wait before being seen by our physicians.       _____________________________________________________________  Should you have questions after your visit to  Cancer Center, please contact our office at [phone] between the hours of 8:30 a.m. and 4:30 p.m.  Voicemails left after 4:30 p.m. will not be returned until the following business day.  For prescription refill requests, have your pharmacy contact our office.       Resources For Cancer Patients and their Caregivers ? American Cancer Society: Can assist with transportation, wigs, general needs, runs Look Good Feel Better.        1-888-227-6333 ? Cancer Care: Provides financial assistance, online support groups, medication/co-pay assistance.  1-800-813-HOPE (4673) ? [facility] Assists [place] cancer patients and their families through emotional , educational and financial support.  336-427-4357 ? [place] DSS Where to apply for food stamps, Medicaid and utility assistance. 336-342-1394 ? RCATS: Transportation to medical appointments. 336-347-2287 ? Social Security Administration: May apply for disability if have a Stage IV cancer. 336-342-7796 1-800-772-1213 ? [place] Aging, Disability and Transit Services: Assists with nutrition, care and transit needs. 336-349-2343  Cancer Center Support Programs: @10RELATIVEDAYS@ > Cancer Support Group  2nd Tuesday of the month 1pm-2pm, Journey Room  > Creative Journey  3rd Tuesday of the month 1130am-1pm, Journey Room  > Look Good Feel Better  1st Wednesday of the month 10am-12 noon, Journey Room (Call American Cancer Society to register [phone])   

## 2017-01-19 ENCOUNTER — Encounter (HOSPITAL_COMMUNITY): Payer: Medicare Other | Attending: Hematology & Oncology

## 2017-01-19 VITALS — BP 142/89 | HR 79 | Temp 98.2°F | Resp 20

## 2017-01-19 DIAGNOSIS — E538 Deficiency of other specified B group vitamins: Secondary | ICD-10-CM

## 2017-01-19 DIAGNOSIS — D472 Monoclonal gammopathy: Secondary | ICD-10-CM | POA: Insufficient documentation

## 2017-01-19 MED ORDER — CYANOCOBALAMIN 1000 MCG/ML IJ SOLN
1000.0000 ug | Freq: Once | INTRAMUSCULAR | Status: AC
  Administered 2017-01-19: 1000 ug via INTRAMUSCULAR
  Filled 2017-01-19: qty 1

## 2017-01-19 NOTE — Progress Notes (Signed)
[name] presents today for injection per MD orders. B12 1000mcg administered IM in left Upper Arm. Administration without incident. Patient tolerated well.  

## 2017-01-19 NOTE — Patient Instructions (Signed)
[facility] at [facility] Discharge Instructions  RECOMMENDATIONS MADE BY THE CONSULTANT AND ANY TEST RESULTS WILL BE SENT TO YOUR REFERRING PHYSICIAN.  Vitamin B12 1000 mcg injection given as ordered. Return as scheduled.  Thank you for choosing [facility] at [facility] to provide your oncology and hematology care.  To afford each patient quality time with our provider, please arrive at least 15 minutes before your scheduled appointment time.    If you have a lab appointment with the Cancer Center please come in thru the  Main Entrance and check in at the main information desk  You need to re-schedule your appointment should you arrive 10 or more minutes late.  We strive to give you quality time with our providers, and arriving late affects you and other patients whose appointments are after yours.  Also, if you no show three or more times for appointments you may be dismissed from the clinic at the providers discretion.     Again, thank you for choosing [facility].  Our hope is that these requests will decrease the amount of time that you wait before being seen by our physicians.       _____________________________________________________________  Should you have questions after your visit to [facility], please contact our office at [phone] between the hours of 8:30 a.m. and 4:30 p.m.  Voicemails left after 4:30 p.m. will not be returned until the following business day.  For prescription refill requests, have your pharmacy contact our office.       Resources For Cancer Patients and their Caregivers ? American Cancer Society: Can assist with transportation, wigs, general needs, runs Look Good Feel Better.        1-888-227-6333 ? Cancer Care: Provides financial assistance, online support groups, medication/co-pay assistance.  1-800-813-HOPE (4673) ? [facility] Assists [place] cancer patients and their families through emotional , educational and financial support.  336-427-4357 ? [place] DSS Where to apply for food stamps, Medicaid and utility assistance. 336-342-1394 ? RCATS: Transportation to medical appointments. 336-347-2287 ? Social Security Administration: May apply for disability if have a Stage IV cancer. 336-342-7796 1-800-772-1213 ? [place] Aging, Disability and Transit Services: Assists with nutrition, care and transit needs. 336-349-2343  Cancer Center Support Programs: @10RELATIVEDAYS@ > Cancer Support Group  2nd Tuesday of the month 1pm-2pm, Journey Room  > Creative Journey  3rd Tuesday of the month 1130am-1pm, Journey Room  > Look Good Feel Better  1st Wednesday of the month 10am-12 noon, Journey Room (Call American Cancer Society to register [phone])   

## 2017-01-26 DIAGNOSIS — Z6831 Body mass index (BMI) 31.0-31.9, adult: Secondary | ICD-10-CM | POA: Diagnosis not present

## 2017-01-26 DIAGNOSIS — J060 Acute laryngopharyngitis: Secondary | ICD-10-CM | POA: Diagnosis not present

## 2017-02-03 DIAGNOSIS — R091 Pleurisy: Secondary | ICD-10-CM | POA: Diagnosis not present

## 2017-02-03 DIAGNOSIS — Z6831 Body mass index (BMI) 31.0-31.9, adult: Secondary | ICD-10-CM | POA: Diagnosis not present

## 2017-02-03 DIAGNOSIS — J060 Acute laryngopharyngitis: Secondary | ICD-10-CM | POA: Diagnosis not present

## 2017-02-03 DIAGNOSIS — R05 Cough: Secondary | ICD-10-CM | POA: Diagnosis not present

## 2017-02-16 ENCOUNTER — Encounter (HOSPITAL_COMMUNITY): Payer: Medicare Other | Attending: Hematology & Oncology

## 2017-02-16 ENCOUNTER — Encounter (HOSPITAL_COMMUNITY): Payer: Self-pay

## 2017-02-16 VITALS — BP 150/69 | HR 68 | Temp 98.2°F | Resp 16

## 2017-02-16 DIAGNOSIS — E538 Deficiency of other specified B group vitamins: Secondary | ICD-10-CM | POA: Diagnosis present

## 2017-02-16 DIAGNOSIS — D472 Monoclonal gammopathy: Secondary | ICD-10-CM | POA: Insufficient documentation

## 2017-02-16 MED ORDER — CYANOCOBALAMIN 1000 MCG/ML IJ SOLN
INTRAMUSCULAR | Status: AC
  Filled 2017-02-16: qty 1

## 2017-02-16 MED ORDER — CYANOCOBALAMIN 1000 MCG/ML IJ SOLN
1000.0000 ug | Freq: Once | INTRAMUSCULAR | Status: AC
  Administered 2017-02-16: 1000 ug via INTRAMUSCULAR

## 2017-02-16 NOTE — Patient Instructions (Signed)
[facility] at [facility] Discharge Instructions  RECOMMENDATIONS MADE BY THE CONSULTANT AND ANY TEST RESULTS WILL BE SENT TO YOUR REFERRING PHYSICIAN.  B12 today.    Thank you for choosing [facility] at [facility] to provide your oncology and hematology care.  To afford each patient quality time with our provider, please arrive at least 15 minutes before your scheduled appointment time.    If you have a lab appointment with the Cancer Center please come in thru the  Main Entrance and check in at the main information desk  You need to re-schedule your appointment should you arrive 10 or more minutes late.  We strive to give you quality time with our providers, and arriving late affects you and other patients whose appointments are after yours.  Also, if you no show three or more times for appointments you may be dismissed from the clinic at the providers discretion.     Again, thank you for choosing [facility].  Our hope is that these requests will decrease the amount of time that you wait before being seen by our physicians.       _____________________________________________________________  Should you have questions after your visit to [facility], please contact our office at [phone] between the hours of 8:30 a.m. and 4:30 p.m.  Voicemails left after 4:30 p.m. will not be returned until the following business day.  For prescription refill requests, have your pharmacy contact our office.       Resources For Cancer Patients and their Caregivers ? American Cancer Society: Can assist with transportation, wigs, general needs, runs Look Good Feel Better.        1-888-227-6333 ? Cancer Care: Provides financial assistance, online support groups, medication/co-pay assistance.  1-800-813-HOPE (4673) ? [facility] Assists [place] cancer patients and their families through emotional ,  educational and financial support.  336-427-4357 ? [place] DSS Where to apply for food stamps, Medicaid and utility assistance. 336-342-1394 ? RCATS: Transportation to medical appointments. 336-347-2287 ? Social Security Administration: May apply for disability if have a Stage IV cancer. 336-342-7796 1-800-772-1213 ? [place] Aging, Disability and Transit Services: Assists with nutrition, care and transit needs. 336-349-2343  Cancer Center Support Programs: @10RELATIVEDAYS@ > Cancer Support Group  2nd Tuesday of the month 1pm-2pm, Journey Room  > Creative Journey  3rd Tuesday of the month 1130am-1pm, Journey Room  > Look Good Feel Better  1st Wednesday of the month 10am-12 noon, Journey Room (Call American Cancer Society to register 1-800-395-5775)    

## 2017-02-16 NOTE — Progress Notes (Signed)
[name] presents today for injection per MD orders. B12 102mcg administered IM in left Upper Arm. Administration without incident. Patient tolerated well.

## 2017-03-14 DIAGNOSIS — I10 Essential (primary) hypertension: Secondary | ICD-10-CM | POA: Diagnosis not present

## 2017-03-14 DIAGNOSIS — E559 Vitamin D deficiency, unspecified: Secondary | ICD-10-CM | POA: Diagnosis not present

## 2017-03-14 DIAGNOSIS — R7301 Impaired fasting glucose: Secondary | ICD-10-CM | POA: Diagnosis not present

## 2017-03-14 DIAGNOSIS — E119 Type 2 diabetes mellitus without complications: Secondary | ICD-10-CM | POA: Diagnosis not present

## 2017-03-14 DIAGNOSIS — Z125 Encounter for screening for malignant neoplasm of prostate: Secondary | ICD-10-CM | POA: Diagnosis not present

## 2017-03-16 DIAGNOSIS — E039 Hypothyroidism, unspecified: Secondary | ICD-10-CM | POA: Diagnosis not present

## 2017-03-16 DIAGNOSIS — E559 Vitamin D deficiency, unspecified: Secondary | ICD-10-CM | POA: Diagnosis not present

## 2017-03-16 DIAGNOSIS — E782 Mixed hyperlipidemia: Secondary | ICD-10-CM | POA: Diagnosis not present

## 2017-03-16 DIAGNOSIS — Z0000 Encounter for general adult medical examination without abnormal findings: Secondary | ICD-10-CM | POA: Diagnosis not present

## 2017-03-16 DIAGNOSIS — E119 Type 2 diabetes mellitus without complications: Secondary | ICD-10-CM | POA: Diagnosis not present

## 2017-03-16 DIAGNOSIS — N189 Chronic kidney disease, unspecified: Secondary | ICD-10-CM | POA: Diagnosis not present

## 2017-03-16 DIAGNOSIS — R972 Elevated prostate specific antigen [PSA]: Secondary | ICD-10-CM | POA: Diagnosis not present

## 2017-03-16 DIAGNOSIS — R7301 Impaired fasting glucose: Secondary | ICD-10-CM | POA: Diagnosis not present

## 2017-03-16 DIAGNOSIS — I10 Essential (primary) hypertension: Secondary | ICD-10-CM | POA: Diagnosis not present

## 2017-03-16 DIAGNOSIS — J302 Other seasonal allergic rhinitis: Secondary | ICD-10-CM | POA: Diagnosis not present

## 2017-03-18 ENCOUNTER — Encounter (HOSPITAL_COMMUNITY): Payer: Medicare Other | Attending: Hematology & Oncology

## 2017-03-18 ENCOUNTER — Encounter (HOSPITAL_COMMUNITY): Payer: Self-pay

## 2017-03-18 VITALS — BP 139/69 | HR 88 | Temp 98.1°F | Resp 16

## 2017-03-18 DIAGNOSIS — E538 Deficiency of other specified B group vitamins: Secondary | ICD-10-CM

## 2017-03-18 DIAGNOSIS — D472 Monoclonal gammopathy: Secondary | ICD-10-CM | POA: Insufficient documentation

## 2017-03-18 MED ORDER — CYANOCOBALAMIN 1000 MCG/ML IJ SOLN
1000.0000 ug | Freq: Once | INTRAMUSCULAR | Status: AC
  Administered 2017-03-18: 1000 ug via INTRAMUSCULAR
  Filled 2017-03-18: qty 1

## 2017-03-18 NOTE — Progress Notes (Signed)
Pt here today for B12 injection. Pt given injection in left deltoid. Pt tolerated well. Pt stable and discharged home ambulatory.  

## 2017-04-01 DIAGNOSIS — Z6831 Body mass index (BMI) 31.0-31.9, adult: Secondary | ICD-10-CM | POA: Diagnosis not present

## 2017-04-01 DIAGNOSIS — J4530 Mild persistent asthma, uncomplicated: Secondary | ICD-10-CM | POA: Diagnosis not present

## 2017-04-14 DIAGNOSIS — N183 Chronic kidney disease, stage 3 (moderate): Secondary | ICD-10-CM | POA: Diagnosis not present

## 2017-04-14 DIAGNOSIS — I10 Essential (primary) hypertension: Secondary | ICD-10-CM | POA: Diagnosis not present

## 2017-04-14 DIAGNOSIS — E559 Vitamin D deficiency, unspecified: Secondary | ICD-10-CM | POA: Diagnosis not present

## 2017-04-19 ENCOUNTER — Encounter (HOSPITAL_COMMUNITY): Payer: Medicare Other | Attending: Hematology

## 2017-04-19 VITALS — BP 135/69 | HR 87 | Temp 97.5°F | Resp 20

## 2017-04-19 DIAGNOSIS — E538 Deficiency of other specified B group vitamins: Secondary | ICD-10-CM

## 2017-04-19 MED ORDER — CYANOCOBALAMIN 1000 MCG/ML IJ SOLN
1000.0000 ug | Freq: Once | INTRAMUSCULAR | Status: AC
  Administered 2017-04-19: 1000 ug via INTRAMUSCULAR
  Filled 2017-04-19: qty 1

## 2017-04-19 NOTE — Patient Instructions (Signed)
[facility] at [facility] Discharge Instructions  RECOMMENDATIONS MADE BY THE CONSULTANT AND ANY TEST RESULTS WILL BE SENT TO YOUR REFERRING PHYSICIAN.  Vitamin B12 1000 mcg injection given as ordered.  Thank you for choosing  [facility] at [facility] to provide your oncology and hematology care.  To afford each patient quality time with our provider, please arrive at least 15 minutes before your scheduled appointment time.    If you have a lab appointment with the Cancer Center please come in thru the  Main Entrance and check in at the main information desk  You need to re-schedule your appointment should you arrive 10 or more minutes late.  We strive to give you quality time with our providers, and arriving late affects you and other patients whose appointments are after yours.  Also, if you no show three or more times for appointments you may be dismissed from the clinic at the providers discretion.     Again, thank you for choosing [facility].  Our hope is that these requests will decrease the amount of time that you wait before being seen by our physicians.       _____________________________________________________________  Should you have questions after your visit to [facility], please contact our office at [phone] between the hours of 8:30 a.m. and 4:30 p.m.  Voicemails left after 4:30 p.m. will not be returned until the following business day.  For prescription refill requests, have your pharmacy contact our office.       Resources For Cancer Patients and their Caregivers ? American Cancer Society: Can assist with transportation, wigs, general needs, runs Look Good Feel Better.        1-888-227-6333 ? Cancer Care: Provides financial assistance, online support groups, medication/co-pay assistance.  1-800-813-HOPE (4673) ? [facility] Assists [place] cancer patients and  their families through emotional , educational and financial support.  336-427-4357 ? [place] DSS Where to apply for food stamps, Medicaid and utility assistance. 336-342-1394 ? RCATS: Transportation to medical appointments. 336-347-2287 ? Social Security Administration: May apply for disability if have a Stage IV cancer. 336-342-7796 1-800-772-1213 ? [place] Aging, Disability and Transit Services: Assists with nutrition, care and transit needs. 336-349-2343  Cancer Center Support Programs: @10RELATIVEDAYS@ > Cancer Support Group  2nd Tuesday of the month 1pm-2pm, Journey Room  > Creative Journey  3rd Tuesday of the month 1130am-1pm, Journey Room  > Look Good Feel Better  1st Wednesday of the month 10am-12 noon, Journey Room (Call American Cancer Society to register 1-800-395-5775)   

## 2017-04-19 NOTE — Progress Notes (Signed)
Cira Servant presents today for injection per MD orders. B12 1000 mcg  administered IM in left Upper Arm. Administration without incident. Patient tolerated well. Stable and ambulatory on discharge home to self.

## 2017-05-17 ENCOUNTER — Encounter (HOSPITAL_COMMUNITY): Payer: Self-pay

## 2017-05-17 ENCOUNTER — Encounter (HOSPITAL_COMMUNITY): Payer: Medicare Other | Attending: Hematology & Oncology

## 2017-05-17 VITALS — BP 148/71 | HR 85 | Temp 97.9°F | Resp 18

## 2017-05-17 DIAGNOSIS — D472 Monoclonal gammopathy: Secondary | ICD-10-CM | POA: Insufficient documentation

## 2017-05-17 DIAGNOSIS — E538 Deficiency of other specified B group vitamins: Secondary | ICD-10-CM

## 2017-05-17 MED ORDER — CYANOCOBALAMIN 1000 MCG/ML IJ SOLN
1000.0000 ug | Freq: Once | INTRAMUSCULAR | Status: AC
  Administered 2017-05-17: 1000 ug via INTRAMUSCULAR
  Filled 2017-05-17: qty 1

## 2017-05-17 NOTE — Patient Instructions (Signed)
[facility] at [facility] Discharge Instructions  RECOMMENDATIONS MADE BY THE CONSULTANT AND ANY TEST RESULTS WILL BE SENT TO YOUR REFERRING PHYSICIAN.  B12 injection today. Return as scheduled.   Thank you for choosing  [facility] at [facility] to provide your oncology and hematology care.  To afford each patient quality time with our provider, please arrive at least 15 minutes before your scheduled appointment time.    If you have a lab appointment with the Cancer Center please come in thru the  Main Entrance and check in at the main information desk  You need to re-schedule your appointment should you arrive 10 or more minutes late.  We strive to give you quality time with our providers, and arriving late affects you and other patients whose appointments are after yours.  Also, if you no show three or more times for appointments you may be dismissed from the clinic at the providers discretion.     Again, thank you for choosing [facility].  Our hope is that these requests will decrease the amount of time that you wait before being seen by our physicians.       _____________________________________________________________  Should you have questions after your visit to  Cancer Center, please contact our office at [phone] between the hours of 8:30 a.m. and 4:30 p.m.  Voicemails left after 4:30 p.m. will not be returned until the following business day.  For prescription refill requests, have your pharmacy contact our office.       Resources For Cancer Patients and their Caregivers ? American Cancer Society: Can assist with transportation, wigs, general needs, runs Look Good Feel Better.        1-888-227-6333 ? Cancer Care: Provides financial assistance, online support groups, medication/co-pay assistance.  1-800-813-HOPE (4673) ? [facility] Assists [place] cancer patients and their  families through emotional , educational and financial support.  336-427-4357 ? [place] DSS Where to apply for food stamps, Medicaid and utility assistance. 336-342-1394 ? [employer]: Transportation to medical appointments. 336-347-2287 ? Social Security Administration: May apply for disability if have a Stage IV cancer. 336-342-7796 1-800-772-1213 ? [place] Aging, Disability and Transit Services: Assists with nutrition, care and transit needs. 336-349-2343  Cancer Center Support Programs: @10RELATIVEDAYS@ > Cancer Support Group  2nd Tuesday of the month 1pm-2pm, Journey Room  > Creative Journey  3rd Tuesday of the month 1130am-1pm, Journey Room  > Look Good Feel Better  1st Wednesday of the month 10am-12 noon, Journey Room (Call American Cancer Society to register 1-800-395-5775)   

## 2017-05-17 NOTE — Progress Notes (Signed)
[name] presents today for injection per the provider's orders.  B12 administration without incident; see MAR for injection details.  Patient tolerated procedure well and without incident.  No questions or complaints noted at this time.  Discharged ambulatory.

## 2017-06-14 ENCOUNTER — Ambulatory Visit: Payer: Medicare Other | Admitting: Urology

## 2017-06-14 ENCOUNTER — Ambulatory Visit (HOSPITAL_COMMUNITY): Payer: BC Managed Care – PPO

## 2017-06-16 ENCOUNTER — Encounter (HOSPITAL_COMMUNITY): Payer: Medicare Other | Attending: Hematology & Oncology

## 2017-06-16 VITALS — BP 121/88 | HR 65 | Temp 97.7°F | Resp 18

## 2017-06-16 DIAGNOSIS — E538 Deficiency of other specified B group vitamins: Secondary | ICD-10-CM | POA: Diagnosis present

## 2017-06-16 DIAGNOSIS — D472 Monoclonal gammopathy: Secondary | ICD-10-CM | POA: Insufficient documentation

## 2017-06-16 MED ORDER — CYANOCOBALAMIN 1000 MCG/ML IJ SOLN
INTRAMUSCULAR | Status: AC
  Filled 2017-06-16: qty 1

## 2017-06-16 MED ORDER — CYANOCOBALAMIN 1000 MCG/ML IJ SOLN
1000.0000 ug | Freq: Once | INTRAMUSCULAR | Status: AC
  Administered 2017-06-16: 1000 ug via INTRAMUSCULAR

## 2017-06-16 NOTE — Progress Notes (Signed)
WILLAIM MODE presents today for injection per the provider's orders.  B12 administration without incident; see MAR for injection details.  Patient tolerated procedure well and without incident.  No questions or complaints noted at this time.  Discharged ambulatory.

## 2017-07-12 ENCOUNTER — Ambulatory Visit (HOSPITAL_COMMUNITY): Payer: BC Managed Care – PPO

## 2017-07-22 ENCOUNTER — Encounter (HOSPITAL_COMMUNITY): Payer: Self-pay

## 2017-07-22 ENCOUNTER — Encounter (HOSPITAL_COMMUNITY): Payer: Medicare Other | Attending: Hematology & Oncology

## 2017-07-22 VITALS — BP 151/76 | HR 85 | Temp 97.9°F | Resp 18

## 2017-07-22 DIAGNOSIS — D472 Monoclonal gammopathy: Secondary | ICD-10-CM | POA: Insufficient documentation

## 2017-07-22 DIAGNOSIS — E538 Deficiency of other specified B group vitamins: Secondary | ICD-10-CM | POA: Diagnosis present

## 2017-07-22 MED ORDER — CYANOCOBALAMIN 1000 MCG/ML IJ SOLN
INTRAMUSCULAR | Status: AC
  Filled 2017-07-22: qty 1

## 2017-07-22 MED ORDER — CYANOCOBALAMIN 1000 MCG/ML IJ SOLN
1000.0000 ug | Freq: Once | INTRAMUSCULAR | Status: AC
  Administered 2017-07-22: 1000 ug via INTRAMUSCULAR

## 2017-07-22 NOTE — Patient Instructions (Signed)
[facility] at [facility] Discharge Instructions  RECOMMENDATIONS MADE BY THE CONSULTANT AND ANY TEST RESULTS WILL BE SENT TO YOUR REFERRING PHYSICIAN.  B12 injection given today. Follow up as scheduled.  Thank you for choosing [facility] at [facility] to provide your oncology and hematology care.  To afford each patient quality time with our provider, please arrive at least 15 minutes before your scheduled appointment time.    If you have a lab appointment with the Cancer Center please come in thru the  Main Entrance and check in at the main information desk  You need to re-schedule your appointment should you arrive 10 or more minutes late.  We strive to give you quality time with our providers, and arriving late affects you and other patients whose appointments are after yours.  Also, if you no show three or more times for appointments you may be dismissed from the clinic at the providers discretion.     Again, thank you for choosing [facility].  Our hope is that these requests will decrease the amount of time that you wait before being seen by our physicians.       _____________________________________________________________  Should you have questions after your visit to  Cancer Center, please contact our office at [phone] between the hours of 8:30 a.m. and 4:30 p.m.  Voicemails left after 4:30 p.m. will not be returned until the following business day.  For prescription refill requests, have your pharmacy contact our office.       Resources For Cancer Patients and their Caregivers ? American Cancer Society: Can assist with transportation, wigs, general needs, runs Look Good Feel Better.        1-888-227-6333 ? Cancer Care: Provides financial assistance, online support groups, medication/co-pay assistance.  1-800-813-HOPE (4673) ? [facility] Assists [place] cancer patients  and their families through emotional , educational and financial support.  336-427-4357 ? [place] DSS Where to apply for food stamps, Medicaid and utility assistance. 336-342-1394 ? [employer]: Transportation to medical appointments. 336-347-2287 ? Social Security Administration: May apply for disability if have a Stage IV cancer. 336-342-7796 1-800-772-1213 ? [place] Aging, Disability and Transit Services: Assists with nutrition, care and transit needs. 336-349-2343  Cancer Center Support Programs: @10RELATIVEDAYS@ > Cancer Support Group  2nd Tuesday of the month 1pm-2pm, Journey Room  > Creative Journey  3rd Tuesday of the month 1130am-1pm, Journey Room  > Look Good Feel Better  1st Wednesday of the month 10am-12 noon, Journey Room (Call American Cancer Society to register 1-800-395-5775)   

## 2017-07-22 NOTE — Progress Notes (Signed)
[name] presents today for injection per MD orders. B12 1,098mcg administered IM in left Upper Arm. Administration without incident. Patient tolerated well.  Treatment given per orders. Patient tolerated it well without problems. Vitals stable and discharged home from clinic ambulatory. Follow up as scheduled.

## 2017-07-26 ENCOUNTER — Ambulatory Visit (INDEPENDENT_AMBULATORY_CARE_PROVIDER_SITE_OTHER): Payer: Medicare Other | Admitting: Urology

## 2017-07-26 DIAGNOSIS — N400 Enlarged prostate without lower urinary tract symptoms: Secondary | ICD-10-CM | POA: Diagnosis not present

## 2017-07-26 DIAGNOSIS — N5201 Erectile dysfunction due to arterial insufficiency: Secondary | ICD-10-CM | POA: Diagnosis not present

## 2017-08-04 DIAGNOSIS — R809 Proteinuria, unspecified: Secondary | ICD-10-CM | POA: Diagnosis not present

## 2017-08-04 DIAGNOSIS — E559 Vitamin D deficiency, unspecified: Secondary | ICD-10-CM | POA: Diagnosis not present

## 2017-08-04 DIAGNOSIS — N183 Chronic kidney disease, stage 3 (moderate): Secondary | ICD-10-CM | POA: Diagnosis not present

## 2017-08-04 DIAGNOSIS — Z79899 Other long term (current) drug therapy: Secondary | ICD-10-CM | POA: Diagnosis not present

## 2017-08-04 DIAGNOSIS — N400 Enlarged prostate without lower urinary tract symptoms: Secondary | ICD-10-CM | POA: Diagnosis not present

## 2017-08-04 DIAGNOSIS — D509 Iron deficiency anemia, unspecified: Secondary | ICD-10-CM | POA: Diagnosis not present

## 2017-08-04 DIAGNOSIS — D519 Vitamin B12 deficiency anemia, unspecified: Secondary | ICD-10-CM | POA: Diagnosis not present

## 2017-08-08 ENCOUNTER — Ambulatory Visit (HOSPITAL_COMMUNITY): Payer: BC Managed Care – PPO

## 2017-08-08 ENCOUNTER — Other Ambulatory Visit (HOSPITAL_COMMUNITY): Payer: BC Managed Care – PPO

## 2017-08-09 ENCOUNTER — Encounter (HOSPITAL_COMMUNITY): Payer: Medicare Other

## 2017-08-09 ENCOUNTER — Encounter (HOSPITAL_COMMUNITY): Payer: Medicare Other | Attending: Hematology & Oncology

## 2017-08-09 ENCOUNTER — Encounter (HOSPITAL_BASED_OUTPATIENT_CLINIC_OR_DEPARTMENT_OTHER): Payer: Medicare Other | Admitting: Oncology

## 2017-08-09 ENCOUNTER — Encounter (HOSPITAL_COMMUNITY): Payer: Self-pay

## 2017-08-09 VITALS — BP 139/83 | HR 77 | Resp 16 | Ht 71.0 in | Wt 224.0 lb

## 2017-08-09 DIAGNOSIS — E538 Deficiency of other specified B group vitamins: Secondary | ICD-10-CM

## 2017-08-09 DIAGNOSIS — D472 Monoclonal gammopathy: Secondary | ICD-10-CM | POA: Insufficient documentation

## 2017-08-09 DIAGNOSIS — N183 Chronic kidney disease, stage 3 (moderate): Secondary | ICD-10-CM

## 2017-08-09 LAB — COMPREHENSIVE METABOLIC PANEL
ALK PHOS: 78 U/L (ref 38–126)
ALT: 25 U/L (ref 17–63)
ANION GAP: 10 (ref 5–15)
AST: 27 U/L (ref 15–41)
Albumin: 4.6 g/dL (ref 3.5–5.0)
BILIRUBIN TOTAL: 0.7 mg/dL (ref 0.3–1.2)
BUN: 18 mg/dL (ref 6–20)
CALCIUM: 9.7 mg/dL (ref 8.9–10.3)
CO2: 32 mmol/L (ref 22–32)
Chloride: 96 mmol/L — ABNORMAL LOW (ref 101–111)
Creatinine, Ser: 1.64 mg/dL — ABNORMAL HIGH (ref 0.61–1.24)
GFR, EST AFRICAN AMERICAN: 46 mL/min — AB (ref >60)
GFR, EST NON AFRICAN AMERICAN: 40 mL/min — AB (ref >60)
GLUCOSE: 100 mg/dL — AB (ref 65–99)
Potassium: 3.7 mmol/L (ref 3.5–5.1)
Sodium: 138 mmol/L (ref 135–145)
TOTAL PROTEIN: 8.2 g/dL — AB (ref 6.5–8.1)

## 2017-08-09 LAB — CBC WITH DIFFERENTIAL/PLATELET
Basophils Absolute: 0.1 10*3/uL (ref 0.0–0.1)
Basophils Relative: 1 %
Eosinophils Absolute: 1.1 10*3/uL — ABNORMAL HIGH (ref 0.0–0.7)
Eosinophils Relative: 11 %
HEMATOCRIT: 43.7 % (ref 39.0–52.0)
HEMOGLOBIN: 14.9 g/dL (ref 13.0–17.0)
Lymphocytes Relative: 26 %
Lymphs Abs: 2.5 10*3/uL (ref 0.7–4.0)
MCH: 35.1 pg — AB (ref 26.0–34.0)
MCHC: 34.1 g/dL (ref 30.0–36.0)
MCV: 102.8 fL — AB (ref 78.0–100.0)
MONOS PCT: 11 %
Monocytes Absolute: 1.1 10*3/uL — ABNORMAL HIGH (ref 0.1–1.0)
NEUTROS ABS: 4.8 10*3/uL (ref 1.7–7.7)
NEUTROS PCT: 51 %
Platelets: 302 10*3/uL (ref 150–400)
RBC: 4.25 MIL/uL (ref 4.22–5.81)
RDW: 13.4 % (ref 11.5–15.5)
WBC: 9.5 10*3/uL (ref 4.0–10.5)

## 2017-08-09 LAB — VITAMIN B12: Vitamin B-12: 6856 pg/mL — ABNORMAL HIGH (ref 180–914)

## 2017-08-09 MED ORDER — CYANOCOBALAMIN 1000 MCG/ML IJ SOLN
INTRAMUSCULAR | Status: AC
  Filled 2017-08-09: qty 1

## 2017-08-09 NOTE — Progress Notes (Signed)
[facility] [place] NOTE  Patient Care Team: Celene Squibb, MD as PCP - General (Internal Medicine) Gala Romney, Cristopher Estimable, MD as Attending Physician (Gastroenterology) Fran Lowes, MD as Consulting Physician (Nephrology) Franchot Gallo, MD as Consulting Physician (Urology)  CHIEF COMPLAINTS/PURPOSE OF CONSULTATION:  MGUS Elevated kappa/lambda ratio of 5.07 Stage III CKD Normal 24 hour urine protein electrophoresis Bone survey on 09/2014 WNL BMBX on 01/13/2016 with plasma cells at 7% with a POLYCLONAL staining pattern  HISTORY OF PRESENTING ILLNESS:  [name] 76 y.o. male is here because of MGUS. He has been doing well since his last visit and has no complaints. He states his CKD is stable. Last SPEP/IFE from 07/2016 was negative for monoclonal paraprotein. He has been getting monthly B12 injections for vitamin B12 deficiency that was discovered in Nov 2017. He has started taking oral B12.   MEDICAL HISTORY:  Past Medical History:  Diagnosis Date  . Asthma   . Blood transfusion abn reaction or complication, no procedure mishap 1979   reaction due to wrong blood type given  . GERD (gastroesophageal reflux disease)    occasional  . H/O: pneumonia 02/27/2015  . Hyperlipidemia   . Hypertension   . Hypothyroidism   . Myocardial infarction (HCC)    hx of abnormal ekg showed prior myocardial infarction  . Renal disorder   . Vitamin B 12 deficiency 09/30/2016    SURGICAL HISTORY: Past Surgical History:  Procedure Laterality Date  . APPENDECTOMY  1963  . CHOLECYSTECTOMY  1979  . COLONOSCOPY  03/28/2008   KXF:GHWEXH rectum; left-sided transverse diverticula diminutive polyp; descending colon. Adenomas.  . COLONOSCOPY N/A 06/07/2013   Procedure: COLONOSCOPY;  Surgeon: Daneil Dolin, MD;  Location: [facility] SUITE;  Service: Endoscopy;  Laterality: N/A;  9:45  . HERNIA REPAIR  2005  . LUMBAR LAMINECTOMY/DECOMPRESSION MICRODISCECTOMY  07/26/2012   Procedure: LUMBAR LAMINECTOMY/DECOMPRESSION MICRODISCECTOMY;  Surgeon: Johnn Hai, MD;  Location: [facility];  Service: Orthopedics;  Laterality: N/A;  Lumbar Decompression L4-L5  . STOMACH SURGERY  2003   exploratory surgery, fatty tumors with obstruction?  . TONSILLECTOMY  1962    SOCIAL HISTORY: Social History   Social History  . Marital status: Married    Spouse name: N/A  . Number of children: N/A  . Years of education: N/A   Occupational History  . Not on file.   Social History Main Topics  . Smoking status: Never Smoker  . Smokeless tobacco: Never Used  . Alcohol use Yes     Comment: occasional  . Drug use: No  . Sexual activity: No   Other Topics Concern  . Not on file   Social History Narrative  . 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 His son is also a Emergency planning/management officer.  FAMILY HISTORY: Family History  Problem Relation Age of Onset  . Thyroid disease Mother   . Heart disease Mother   . Hypertension Mother   . Clotting disorder Father   . Hypertension Father   . Cancer Sister   . Hypertension Sister   . Colon cancer Neg Hx    indicated that his mother is deceased. He indicated that his father is alive. He indicated that one of his two sisters is deceased. He indicated that his brother is deceased. He indicated that the status of his neg hx is unknown.    Mother deceased at 28 yo  from heart attack. 5 siblings. He has multiple uncles who have passed from cancer  ALLERGIES:  is allergic to meperidine and related and sulfa antibiotics.  MEDICATIONS:  Current Outpatient Prescriptions  Medication Sig Dispense Refill  . acetaminophen (TYLENOL) 500 MG tablet Take 500 mg by mouth every 6 (six) hours as needed.    Marland Kitchen ADVAIR DISKUS 100-50 MCG/DOSE AEPB Inhale 1 Dose into the lungs as needed.  2  . ADVAIR DISKUS 250-50 MCG/DOSE AEPB INHALE 2 PUFFS INTO LUNGS TWO TIMES DAILY  3  . albuterol (PROVENTIL) (2.5 MG/3ML)  0.083% nebulizer solution Take 2.5 mg by nebulization every 6 (six) hours as needed. Reported on 12/03/2015    . amLODipine (NORVASC) 5 MG tablet Take 5 mg by mouth daily.  5  . aspirin EC 81 MG tablet Take 81 mg by mouth daily.    . Cyanocobalamin (VITAMIN B-12) 5000 MCG TBDP Take 1 tablet by mouth daily.    . folic acid (FOLVITE) 440 MCG tablet Take 400 mcg by mouth daily.    Marland Kitchen levalbuterol (XOPENEX) 1.25 MG/3ML nebulizer solution Inhale 1.25 mg into the lungs 3 (three) times daily as needed. Reported on 12/03/2015    . levothyroxine (SYNTHROID, LEVOTHROID) 75 MCG tablet Take 75 mcg by mouth at bedtime.    Marland Kitchen MAGNESIUM PO Take 1 tablet by mouth daily.    . simvastatin (ZOCOR) 40 MG tablet Take 40 mg by mouth at bedtime.    . tamsulosin (FLOMAX) 0.4 MG CAPS capsule Take 0.4 mg by mouth daily.    Marland Kitchen triamterene-hydrochlorothiazide (MAXZIDE-25) 37.5-25 MG tablet Take 0.5 tablets by mouth every other day. Every other day      No current facility-administered medications for this visit.     Review of Systems  Constitutional: Negative for fever, chills, weight loss and malaise/fatigue.  HENT: Negative for congestion, hearing loss, nosebleeds, sore throat and tinnitus.   Eyes: Negative for blurred vision, double vision, pain and discharge.  Respiratory:  Negative for cough, wheezing, hemoptysis, sputum production and shortness of breath. Cardiovascular: Negative for chest pain, palpitations, claudication, leg swelling and PND.  Gastrointestinal: Negative for heartburn, nausea, vomiting, abdominal pain, diarrhea, constipation, blood in stool and melena.  Genitourinary: Negative for dysuria, urgency, frequency and hematuria.  Musculoskeletal: Negative for myalgias, joint pain and falls.  Skin: Negative for itching and rash.  Neurological: Negative for dizziness, tingling, tremors, sensory change, speech change, focal weakness, seizures, loss of consciousness, weakness and headaches.    Endo/Heme/Allergies: Does not bruise/bleed easily.  Psychiatric/Behavioral: Negative for depression, suicidal ideas, memory loss and substance abuse. The patient is not nervous/anxious and does not have insomnia.   14 point review of systems was performed and is negative except as detailed under history of present illness and above    PHYSICAL EXAMINATION:  ECOG PERFORMANCE STATUS: 0 - Asymptomatic  Vitals:   08/09/17 1408  BP: 139/83  Pulse: 77  Resp: 16  SpO2: 98%   Filed Weights   08/09/17 1408  Weight: 224 lb (101.6 kg)    Physical Exam  Constitutional: He is oriented to person, place, and time and well-developed, well-nourished, and in no distress.  HENT:  Head: Normocephalic and atraumatic.  Nose: Nose normal.  Mouth/Throat: Oropharynx is clear and moist. No oropharyngeal exudate.  Eyes: Conjunctivae and EOM are normal. Pupils are equal, round, and reactive to light. Right eye exhibits no discharge. Left eye exhibits no discharge. No scleral icterus.  Neck: Normal range of motion. Neck supple. No tracheal deviation  present. No thyromegaly present.  Cardiovascular: Normal rate, regular rhythm and normal heart sounds.  Exam reveals no gallop and no friction rub.   No murmur heard. Pulmonary/Chest: Effort normal.  He has no rales.  Abdominal: Soft. Bowel sounds are normal. He exhibits no distension and no mass. There is no tenderness. There is no rebound and no guarding.  Musculoskeletal: Normal range of motion. He exhibits no edema.  Lymphadenopathy:    He has no cervical adenopathy.  Neurological: He is alert and oriented to person, place, and time. He has normal reflexes. No cranial nerve deficit. Gait normal. Coordination normal.  Skin: Skin is warm and dry. No rash noted.  Psychiatric: Mood, memory, affect and judgment normal.  Nursing note and vitals reviewed.   LABORATORY DATA:  I have reviewed the data as listed CBC    Component Value Date/Time   WBC 9.5  08/09/2017 1337   RBC 4.25 08/09/2017 1337   HGB 14.9 08/09/2017 1337   HCT 43.7 08/09/2017 1337   PLT 302 08/09/2017 1337   MCV 102.8 (H) 08/09/2017 1337   MCH 35.1 (H) 08/09/2017 1337   MCHC 34.1 08/09/2017 1337   RDW 13.4 08/09/2017 1337   LYMPHSABS 2.5 08/09/2017 1337   MONOABS 1.1 (H) 08/09/2017 1337   EOSABS 1.1 (H) 08/09/2017 1337   BASOSABS 0.1 08/09/2017 1337    CMP Latest Ref Rng & Units 08/09/2017 07/27/2016 01/13/2016  Glucose 65 - 99 mg/dL 100(H) 141(H) 131(H)  BUN 6 - 20 mg/dL 18 18 23(H)  Creatinine 0.61 - 1.24 mg/dL 1.64(H) 1.67(H) 1.69(H)  Sodium 135 - 145 mmol/L 138 141 141  Potassium 3.5 - 5.1 mmol/L 3.7 3.4(L) 3.8  Chloride 101 - 111 mmol/L 96(L) 102 106  CO2 22 - 32 mmol/L 32 30 26  Calcium 8.9 - 10.3 mg/dL 9.7 9.4 9.8  Total Protein 6.5 - 8.1 g/dL 8.2(H) 7.7 -  Total Bilirubin 0.3 - 1.2 mg/dL 0.7 0.5 -  Alkaline Phos 38 - 126 U/L 78 77 -  AST 15 - 41 U/L 27 28 -  ALT 17 - 63 U/L 25 20 -    Pathology:    ASSESSMENT & PLAN:  Mild plasmacytosis on Bone Marrow Biopsy, no evidence of monoclonal gammopathy. Vitamin B12 deficiency. Stage III CKD Bone survey on 09/2014 WNL  -BMBX showing 7% plasma cells but they are POLYCLONAL.  -Will follow up on his myeloma labs from today. They are pending. -No evidence of anemia. Reviewed CBC with the patient. -Patient has been on vitamin B12 injections monthly for a  Year now. We will d/c the injections and recheck his Vitamin B12 levels. I have advised him he can continue oral B12. -RTC in 1 year for follow up with repeat myeloma labs.  All questions were answered. The patient knows to call the clinic with any problems, questions or concerns.    This note was electronically signed.    Twana First, MD

## 2017-08-09 NOTE — Progress Notes (Signed)
Patient did not receive injection at today's visit. It has not been one month since last injection. Patient aware and appointments made.

## 2017-08-09 NOTE — Patient Instructions (Signed)
[place] at [facility] Discharge Instructions  RECOMMENDATIONS MADE BY THE CONSULTANT AND ANY TEST RESULTS WILL BE SENT TO YOUR REFERRING PHYSICIAN.  You did not get your B-12 injection today, it is too early.  Thank you for choosing [place] at [facility] to provide your oncology and hematology care.  To afford each patient quality time with our provider, please arrive at least 15 minutes before your scheduled appointment time.    If you have a lab appointment with the [facility] please come in thru the  Main Entrance and check in at the main information desk  You need to re-schedule your appointment should you arrive 10 or more minutes late.  We strive to give you quality time with our providers, and arriving late affects you and other patients whose appointments are after yours.  Also, if you no show three or more times for appointments you may be dismissed from the clinic at the providers discretion.     Again, thank you for choosing [facility].  Our hope is that these requests will decrease the amount of time that you wait before being seen by our physicians.       _____________________________________________________________  Should you have questions after your visit to [facility], please contact our office at [phone] between the hours of 8:30 a.m. and 4:30 p.m.  Voicemails left after 4:30 p.m. will not be returned until the following business day.  For prescription refill requests, have your pharmacy contact our office.       Resources For Cancer Patients and their Caregivers ? American Cancer Society: Can assist with transportation, wigs, general needs, runs Look Good Feel Better.        305-027-7571 ? Cancer Care: Provides financial assistance, online support groups, medication/co-pay assistance.  1-800-813-HOPE 7574348984) ? [facility] Assists [place] cancer  patients and their families through emotional , educational and financial support.  8162639815 ? [place] DSS Where to apply for food stamps, Medicaid and utility assistance. 248 388 9032 ? [facility]: Transportation to medical appointments. 5743106826 ? Social Security Administration: May apply for disability if have a Stage IV cancer. 249 288 5789 978-136-5812 ? [employer], Disability and Transit Services: Assists with nutrition, care and transit needs. [place] Support Programs: @10RELATIVEDAYS @ > Cancer Support Group  2nd Tuesday of the month 1pm-2pm, Journey Room  > Creative Journey  3rd Tuesday of the month 1130am-1pm, Journey Room  > Look Good Feel Better  1st Wednesday of the month 10am-12 noon, Journey Room (Call [facility] to register [phone])

## 2017-08-10 DIAGNOSIS — E559 Vitamin D deficiency, unspecified: Secondary | ICD-10-CM | POA: Diagnosis not present

## 2017-08-10 DIAGNOSIS — N2581 Secondary hyperparathyroidism of renal origin: Secondary | ICD-10-CM | POA: Diagnosis not present

## 2017-08-10 DIAGNOSIS — N183 Chronic kidney disease, stage 3 (moderate): Secondary | ICD-10-CM | POA: Diagnosis not present

## 2017-08-10 LAB — PROTEIN ELECTROPHORESIS, SERUM
A/G RATIO SPE: 1.1 (ref 0.7–1.7)
ALBUMIN ELP: 4 g/dL (ref 2.9–4.4)
Alpha-1-Globulin: 0.3 g/dL (ref 0.0–0.4)
Alpha-2-Globulin: 0.9 g/dL (ref 0.4–1.0)
BETA GLOBULIN: 1.3 g/dL (ref 0.7–1.3)
Gamma Globulin: 1.2 g/dL (ref 0.4–1.8)
Globulin, Total: 3.7 g/dL (ref 2.2–3.9)
Total Protein ELP: 7.7 g/dL (ref 6.0–8.5)

## 2017-08-10 LAB — KAPPA/LAMBDA LIGHT CHAINS
KAPPA FREE LGHT CHN: 21.8 mg/L — AB (ref 3.3–19.4)
Kappa, lambda light chain ratio: 1.69 — ABNORMAL HIGH (ref 0.26–1.65)
Lambda free light chains: 12.9 mg/L (ref 5.7–26.3)

## 2017-08-10 LAB — IGG, IGA, IGM
IGM (IMMUNOGLOBULIN M), SRM: 48 mg/dL (ref 15–143)
IgA: 202 mg/dL (ref 61–437)
IgG (Immunoglobin G), Serum: 1177 mg/dL (ref 700–1600)

## 2017-08-11 LAB — IMMUNOFIXATION ELECTROPHORESIS
IGG (IMMUNOGLOBIN G), SERUM: 1144 mg/dL (ref 700–1600)
IgA: 192 mg/dL (ref 61–437)
IgM (Immunoglobulin M), Srm: 43 mg/dL (ref 15–143)
Total Protein ELP: 7.8 g/dL (ref 6.0–8.5)

## 2017-08-19 ENCOUNTER — Ambulatory Visit (HOSPITAL_COMMUNITY): Payer: BC Managed Care – PPO

## 2017-09-14 DIAGNOSIS — R7301 Impaired fasting glucose: Secondary | ICD-10-CM | POA: Diagnosis not present

## 2017-09-14 DIAGNOSIS — I10 Essential (primary) hypertension: Secondary | ICD-10-CM | POA: Diagnosis not present

## 2017-09-14 DIAGNOSIS — E782 Mixed hyperlipidemia: Secondary | ICD-10-CM | POA: Diagnosis not present

## 2017-09-14 DIAGNOSIS — E039 Hypothyroidism, unspecified: Secondary | ICD-10-CM | POA: Diagnosis not present

## 2017-09-16 DIAGNOSIS — E785 Hyperlipidemia, unspecified: Secondary | ICD-10-CM | POA: Diagnosis not present

## 2017-09-16 DIAGNOSIS — I10 Essential (primary) hypertension: Secondary | ICD-10-CM | POA: Diagnosis not present

## 2017-09-16 DIAGNOSIS — R7301 Impaired fasting glucose: Secondary | ICD-10-CM | POA: Diagnosis not present

## 2017-09-16 DIAGNOSIS — E559 Vitamin D deficiency, unspecified: Secondary | ICD-10-CM | POA: Diagnosis not present

## 2017-09-16 DIAGNOSIS — E039 Hypothyroidism, unspecified: Secondary | ICD-10-CM | POA: Diagnosis not present

## 2017-09-16 DIAGNOSIS — N183 Chronic kidney disease, stage 3 (moderate): Secondary | ICD-10-CM | POA: Diagnosis not present

## 2017-09-16 DIAGNOSIS — J4530 Mild persistent asthma, uncomplicated: Secondary | ICD-10-CM | POA: Diagnosis not present

## 2017-09-16 DIAGNOSIS — Z23 Encounter for immunization: Secondary | ICD-10-CM | POA: Diagnosis not present

## 2017-09-27 DIAGNOSIS — H40053 Ocular hypertension, bilateral: Secondary | ICD-10-CM | POA: Diagnosis not present

## 2017-09-27 DIAGNOSIS — H524 Presbyopia: Secondary | ICD-10-CM | POA: Diagnosis not present

## 2017-09-28 DIAGNOSIS — R238 Other skin changes: Secondary | ICD-10-CM | POA: Diagnosis not present

## 2017-09-28 DIAGNOSIS — D1801 Hemangioma of skin and subcutaneous tissue: Secondary | ICD-10-CM | POA: Diagnosis not present

## 2017-09-28 DIAGNOSIS — L821 Other seborrheic keratosis: Secondary | ICD-10-CM | POA: Diagnosis not present

## 2017-09-28 DIAGNOSIS — Z23 Encounter for immunization: Secondary | ICD-10-CM | POA: Diagnosis not present

## 2017-11-02 ENCOUNTER — Other Ambulatory Visit (HOSPITAL_COMMUNITY)
Admission: RE | Admit: 2017-11-02 | Discharge: 2017-11-02 | Disposition: A | Discharge status: Home / Self Care | Payer: Medicare Other | Source: Ambulatory Visit | Attending: Nephrology | Admitting: Nephrology

## 2017-11-02 DIAGNOSIS — D519 Vitamin B12 deficiency anemia, unspecified: Secondary | ICD-10-CM | POA: Diagnosis not present

## 2017-11-02 DIAGNOSIS — Z79899 Other long term (current) drug therapy: Secondary | ICD-10-CM | POA: Diagnosis not present

## 2017-11-02 DIAGNOSIS — R809 Proteinuria, unspecified: Secondary | ICD-10-CM | POA: Insufficient documentation

## 2017-11-02 DIAGNOSIS — D509 Iron deficiency anemia, unspecified: Secondary | ICD-10-CM | POA: Diagnosis not present

## 2017-11-02 DIAGNOSIS — N400 Enlarged prostate without lower urinary tract symptoms: Secondary | ICD-10-CM | POA: Insufficient documentation

## 2017-11-02 DIAGNOSIS — N183 Chronic kidney disease, stage 3 (moderate): Secondary | ICD-10-CM | POA: Diagnosis not present

## 2017-11-02 LAB — RENAL FUNCTION PANEL
Albumin: 4.6 g/dL (ref 3.5–5.0)
Anion gap: 8 (ref 5–15)
BUN: 17 mg/dL (ref 6–20)
CHLORIDE: 98 mmol/L — AB (ref 101–111)
CO2: 33 mmol/L — ABNORMAL HIGH (ref 22–32)
Calcium: 9.8 mg/dL (ref 8.9–10.3)
Creatinine, Ser: 1.79 mg/dL — ABNORMAL HIGH (ref 0.61–1.24)
GFR calc Af Amer: 41 mL/min — ABNORMAL LOW (ref >60)
GFR calc non Af Amer: 36 mL/min — ABNORMAL LOW (ref >60)
GLUCOSE: 134 mg/dL — AB (ref 65–99)
POTASSIUM: 3.4 mmol/L — AB (ref 3.5–5.1)
Phosphorus: 3.3 mg/dL (ref 2.5–4.6)
Sodium: 139 mmol/L (ref 135–145)

## 2017-11-02 LAB — FERRITIN: Ferritin: 166 ng/mL (ref 24–336)

## 2017-11-02 LAB — PROTEIN / CREATININE RATIO, URINE
CREATININE, URINE: 133.86 mg/dL
PROTEIN CREATININE RATIO: 0.07 mg/mg{creat} (ref 0.00–0.15)
Total Protein, Urine: 9 mg/dL

## 2017-11-02 LAB — HEMOGLOBIN AND HEMATOCRIT, BLOOD
HEMATOCRIT: 48.9 % (ref 39.0–52.0)
Hemoglobin: 16 g/dL (ref 13.0–17.0)

## 2017-11-02 LAB — IRON AND TIBC
IRON: 151 ug/dL (ref 45–182)
Saturation Ratios: 45 % — ABNORMAL HIGH (ref 17.9–39.5)
TIBC: 336 ug/dL (ref 250–450)
UIBC: 185 ug/dL

## 2017-11-02 LAB — PSA: Prostatic Specific Antigen: 3 ng/mL (ref 0.00–4.00)

## 2017-11-02 LAB — FOLATE: Folate: 36 ng/mL (ref >5.9)

## 2017-11-03 LAB — VITAMIN D 25 HYDROXY (VIT D DEFICIENCY, FRACTURES): VIT D 25 HYDROXY: 24.6 ng/mL — AB (ref 30.0–100.0)

## 2017-11-03 LAB — PARATHYROID HORMONE, INTACT (NO CA): PTH: 29 pg/mL (ref 15–65)

## 2017-11-03 LAB — VITAMIN B12: VITAMIN B 12: 3774 pg/mL — AB (ref 180–914)

## 2017-11-09 DIAGNOSIS — N183 Chronic kidney disease, stage 3 (moderate): Secondary | ICD-10-CM | POA: Diagnosis not present

## 2017-11-09 DIAGNOSIS — E876 Hypokalemia: Secondary | ICD-10-CM | POA: Diagnosis not present

## 2017-11-09 DIAGNOSIS — E559 Vitamin D deficiency, unspecified: Secondary | ICD-10-CM | POA: Diagnosis not present

## 2017-11-24 DIAGNOSIS — E876 Hypokalemia: Secondary | ICD-10-CM | POA: Diagnosis not present

## 2018-01-07 DIAGNOSIS — J069 Acute upper respiratory infection, unspecified: Secondary | ICD-10-CM | POA: Diagnosis not present

## 2018-01-07 DIAGNOSIS — Z6831 Body mass index (BMI) 31.0-31.9, adult: Secondary | ICD-10-CM | POA: Diagnosis not present

## 2018-01-11 DIAGNOSIS — L570 Actinic keratosis: Secondary | ICD-10-CM | POA: Diagnosis not present

## 2018-01-11 DIAGNOSIS — L821 Other seborrheic keratosis: Secondary | ICD-10-CM | POA: Diagnosis not present

## 2018-01-11 DIAGNOSIS — D2230 Melanocytic nevi of unspecified part of face: Secondary | ICD-10-CM | POA: Diagnosis not present

## 2018-01-11 DIAGNOSIS — D485 Neoplasm of uncertain behavior of skin: Secondary | ICD-10-CM | POA: Diagnosis not present

## 2018-01-11 DIAGNOSIS — D225 Melanocytic nevi of trunk: Secondary | ICD-10-CM | POA: Diagnosis not present

## 2018-01-11 DIAGNOSIS — R238 Other skin changes: Secondary | ICD-10-CM | POA: Diagnosis not present

## 2018-01-11 DIAGNOSIS — Z23 Encounter for immunization: Secondary | ICD-10-CM | POA: Diagnosis not present

## 2018-01-31 DIAGNOSIS — I10 Essential (primary) hypertension: Secondary | ICD-10-CM | POA: Diagnosis not present

## 2018-01-31 DIAGNOSIS — R809 Proteinuria, unspecified: Secondary | ICD-10-CM | POA: Diagnosis not present

## 2018-01-31 DIAGNOSIS — N183 Chronic kidney disease, stage 3 (moderate): Secondary | ICD-10-CM | POA: Diagnosis not present

## 2018-01-31 DIAGNOSIS — D509 Iron deficiency anemia, unspecified: Secondary | ICD-10-CM | POA: Diagnosis not present

## 2018-01-31 DIAGNOSIS — Z79899 Other long term (current) drug therapy: Secondary | ICD-10-CM | POA: Diagnosis not present

## 2018-01-31 DIAGNOSIS — E559 Vitamin D deficiency, unspecified: Secondary | ICD-10-CM | POA: Diagnosis not present

## 2018-02-08 DIAGNOSIS — E876 Hypokalemia: Secondary | ICD-10-CM | POA: Diagnosis not present

## 2018-02-08 DIAGNOSIS — E559 Vitamin D deficiency, unspecified: Secondary | ICD-10-CM | POA: Diagnosis not present

## 2018-02-08 DIAGNOSIS — N183 Chronic kidney disease, stage 3 (moderate): Secondary | ICD-10-CM | POA: Diagnosis not present

## 2018-02-15 DIAGNOSIS — M9905 Segmental and somatic dysfunction of pelvic region: Secondary | ICD-10-CM | POA: Diagnosis not present

## 2018-02-15 DIAGNOSIS — M9902 Segmental and somatic dysfunction of thoracic region: Secondary | ICD-10-CM | POA: Diagnosis not present

## 2018-02-15 DIAGNOSIS — M9903 Segmental and somatic dysfunction of lumbar region: Secondary | ICD-10-CM | POA: Diagnosis not present

## 2018-02-15 DIAGNOSIS — M5442 Lumbago with sciatica, left side: Secondary | ICD-10-CM | POA: Diagnosis not present

## 2018-02-17 DIAGNOSIS — M5442 Lumbago with sciatica, left side: Secondary | ICD-10-CM | POA: Diagnosis not present

## 2018-02-17 DIAGNOSIS — M9903 Segmental and somatic dysfunction of lumbar region: Secondary | ICD-10-CM | POA: Diagnosis not present

## 2018-02-17 DIAGNOSIS — M9905 Segmental and somatic dysfunction of pelvic region: Secondary | ICD-10-CM | POA: Diagnosis not present

## 2018-02-17 DIAGNOSIS — M9902 Segmental and somatic dysfunction of thoracic region: Secondary | ICD-10-CM | POA: Diagnosis not present

## 2018-02-21 ENCOUNTER — Ambulatory Visit (INDEPENDENT_AMBULATORY_CARE_PROVIDER_SITE_OTHER): Payer: Medicare Other | Admitting: Urology

## 2018-02-21 DIAGNOSIS — R351 Nocturia: Secondary | ICD-10-CM

## 2018-02-21 DIAGNOSIS — N400 Enlarged prostate without lower urinary tract symptoms: Secondary | ICD-10-CM | POA: Diagnosis not present

## 2018-02-21 DIAGNOSIS — N5201 Erectile dysfunction due to arterial insufficiency: Secondary | ICD-10-CM | POA: Diagnosis not present

## 2018-02-21 DIAGNOSIS — N401 Enlarged prostate with lower urinary tract symptoms: Secondary | ICD-10-CM | POA: Diagnosis not present

## 2018-02-24 DIAGNOSIS — M9905 Segmental and somatic dysfunction of pelvic region: Secondary | ICD-10-CM | POA: Diagnosis not present

## 2018-02-24 DIAGNOSIS — M5442 Lumbago with sciatica, left side: Secondary | ICD-10-CM | POA: Diagnosis not present

## 2018-02-24 DIAGNOSIS — M9903 Segmental and somatic dysfunction of lumbar region: Secondary | ICD-10-CM | POA: Diagnosis not present

## 2018-02-24 DIAGNOSIS — M9902 Segmental and somatic dysfunction of thoracic region: Secondary | ICD-10-CM | POA: Diagnosis not present

## 2018-03-08 DIAGNOSIS — J060 Acute laryngopharyngitis: Secondary | ICD-10-CM | POA: Diagnosis not present

## 2018-03-08 DIAGNOSIS — J069 Acute upper respiratory infection, unspecified: Secondary | ICD-10-CM | POA: Diagnosis not present

## 2018-03-08 DIAGNOSIS — J4530 Mild persistent asthma, uncomplicated: Secondary | ICD-10-CM | POA: Diagnosis not present

## 2018-03-08 DIAGNOSIS — Z6831 Body mass index (BMI) 31.0-31.9, adult: Secondary | ICD-10-CM | POA: Diagnosis not present

## 2018-03-08 DIAGNOSIS — R7301 Impaired fasting glucose: Secondary | ICD-10-CM | POA: Diagnosis not present

## 2018-03-08 DIAGNOSIS — I10 Essential (primary) hypertension: Secondary | ICD-10-CM | POA: Diagnosis not present

## 2018-03-08 DIAGNOSIS — E785 Hyperlipidemia, unspecified: Secondary | ICD-10-CM | POA: Diagnosis not present

## 2018-03-08 DIAGNOSIS — Z23 Encounter for immunization: Secondary | ICD-10-CM | POA: Diagnosis not present

## 2018-03-08 DIAGNOSIS — E559 Vitamin D deficiency, unspecified: Secondary | ICD-10-CM | POA: Diagnosis not present

## 2018-03-08 DIAGNOSIS — J019 Acute sinusitis, unspecified: Secondary | ICD-10-CM | POA: Diagnosis not present

## 2018-03-08 DIAGNOSIS — E039 Hypothyroidism, unspecified: Secondary | ICD-10-CM | POA: Diagnosis not present

## 2018-03-08 DIAGNOSIS — N189 Chronic kidney disease, unspecified: Secondary | ICD-10-CM | POA: Diagnosis not present

## 2018-03-15 DIAGNOSIS — J069 Acute upper respiratory infection, unspecified: Secondary | ICD-10-CM | POA: Diagnosis not present

## 2018-03-15 DIAGNOSIS — J019 Acute sinusitis, unspecified: Secondary | ICD-10-CM | POA: Diagnosis not present

## 2018-03-15 DIAGNOSIS — N183 Chronic kidney disease, stage 3 (moderate): Secondary | ICD-10-CM | POA: Diagnosis not present

## 2018-03-15 DIAGNOSIS — J4530 Mild persistent asthma, uncomplicated: Secondary | ICD-10-CM | POA: Diagnosis not present

## 2018-03-15 DIAGNOSIS — R7301 Impaired fasting glucose: Secondary | ICD-10-CM | POA: Diagnosis not present

## 2018-03-15 DIAGNOSIS — I10 Essential (primary) hypertension: Secondary | ICD-10-CM | POA: Diagnosis not present

## 2018-03-15 DIAGNOSIS — E785 Hyperlipidemia, unspecified: Secondary | ICD-10-CM | POA: Diagnosis not present

## 2018-03-15 DIAGNOSIS — Z6831 Body mass index (BMI) 31.0-31.9, adult: Secondary | ICD-10-CM | POA: Diagnosis not present

## 2018-03-15 DIAGNOSIS — E039 Hypothyroidism, unspecified: Secondary | ICD-10-CM | POA: Diagnosis not present

## 2018-03-15 DIAGNOSIS — N189 Chronic kidney disease, unspecified: Secondary | ICD-10-CM | POA: Diagnosis not present

## 2018-03-15 DIAGNOSIS — J060 Acute laryngopharyngitis: Secondary | ICD-10-CM | POA: Diagnosis not present

## 2018-03-15 DIAGNOSIS — E559 Vitamin D deficiency, unspecified: Secondary | ICD-10-CM | POA: Diagnosis not present

## 2018-03-17 DIAGNOSIS — J45901 Unspecified asthma with (acute) exacerbation: Secondary | ICD-10-CM | POA: Diagnosis not present

## 2018-03-17 DIAGNOSIS — E782 Mixed hyperlipidemia: Secondary | ICD-10-CM | POA: Diagnosis not present

## 2018-03-17 DIAGNOSIS — Z6831 Body mass index (BMI) 31.0-31.9, adult: Secondary | ICD-10-CM | POA: Diagnosis not present

## 2018-03-17 DIAGNOSIS — R7301 Impaired fasting glucose: Secondary | ICD-10-CM | POA: Diagnosis not present

## 2018-03-17 DIAGNOSIS — I129 Hypertensive chronic kidney disease with stage 1 through stage 4 chronic kidney disease, or unspecified chronic kidney disease: Secondary | ICD-10-CM | POA: Diagnosis not present

## 2018-03-17 DIAGNOSIS — R972 Elevated prostate specific antigen [PSA]: Secondary | ICD-10-CM | POA: Diagnosis not present

## 2018-03-17 DIAGNOSIS — E559 Vitamin D deficiency, unspecified: Secondary | ICD-10-CM | POA: Diagnosis not present

## 2018-03-17 DIAGNOSIS — E039 Hypothyroidism, unspecified: Secondary | ICD-10-CM | POA: Diagnosis not present

## 2018-03-17 DIAGNOSIS — Z0000 Encounter for general adult medical examination without abnormal findings: Secondary | ICD-10-CM | POA: Diagnosis not present

## 2018-03-17 DIAGNOSIS — J019 Acute sinusitis, unspecified: Secondary | ICD-10-CM | POA: Diagnosis not present

## 2018-03-17 DIAGNOSIS — N183 Chronic kidney disease, stage 3 (moderate): Secondary | ICD-10-CM | POA: Diagnosis not present

## 2018-03-23 DIAGNOSIS — E559 Vitamin D deficiency, unspecified: Secondary | ICD-10-CM | POA: Diagnosis not present

## 2018-03-23 DIAGNOSIS — Z6831 Body mass index (BMI) 31.0-31.9, adult: Secondary | ICD-10-CM | POA: Diagnosis not present

## 2018-03-23 DIAGNOSIS — N189 Chronic kidney disease, unspecified: Secondary | ICD-10-CM | POA: Diagnosis not present

## 2018-03-23 DIAGNOSIS — J069 Acute upper respiratory infection, unspecified: Secondary | ICD-10-CM | POA: Diagnosis not present

## 2018-03-23 DIAGNOSIS — I10 Essential (primary) hypertension: Secondary | ICD-10-CM | POA: Diagnosis not present

## 2018-03-23 DIAGNOSIS — Z23 Encounter for immunization: Secondary | ICD-10-CM | POA: Diagnosis not present

## 2018-03-23 DIAGNOSIS — J019 Acute sinusitis, unspecified: Secondary | ICD-10-CM | POA: Diagnosis not present

## 2018-03-23 DIAGNOSIS — J4530 Mild persistent asthma, uncomplicated: Secondary | ICD-10-CM | POA: Diagnosis not present

## 2018-03-23 DIAGNOSIS — E785 Hyperlipidemia, unspecified: Secondary | ICD-10-CM | POA: Diagnosis not present

## 2018-03-23 DIAGNOSIS — E039 Hypothyroidism, unspecified: Secondary | ICD-10-CM | POA: Diagnosis not present

## 2018-03-23 DIAGNOSIS — J060 Acute laryngopharyngitis: Secondary | ICD-10-CM | POA: Diagnosis not present

## 2018-03-23 DIAGNOSIS — R7301 Impaired fasting glucose: Secondary | ICD-10-CM | POA: Diagnosis not present

## 2018-04-06 DIAGNOSIS — I10 Essential (primary) hypertension: Secondary | ICD-10-CM | POA: Diagnosis not present

## 2018-04-27 ENCOUNTER — Encounter: Payer: Self-pay | Admitting: Internal Medicine

## 2018-05-10 DIAGNOSIS — J45901 Unspecified asthma with (acute) exacerbation: Secondary | ICD-10-CM | POA: Diagnosis not present

## 2018-05-10 DIAGNOSIS — Z23 Encounter for immunization: Secondary | ICD-10-CM | POA: Diagnosis not present

## 2018-05-10 DIAGNOSIS — S30861A Insect bite (nonvenomous) of abdominal wall, initial encounter: Secondary | ICD-10-CM | POA: Diagnosis not present

## 2018-05-10 DIAGNOSIS — E559 Vitamin D deficiency, unspecified: Secondary | ICD-10-CM | POA: Diagnosis not present

## 2018-05-10 DIAGNOSIS — I10 Essential (primary) hypertension: Secondary | ICD-10-CM | POA: Diagnosis not present

## 2018-05-10 DIAGNOSIS — J069 Acute upper respiratory infection, unspecified: Secondary | ICD-10-CM | POA: Diagnosis not present

## 2018-05-10 DIAGNOSIS — J060 Acute laryngopharyngitis: Secondary | ICD-10-CM | POA: Diagnosis not present

## 2018-05-10 DIAGNOSIS — E785 Hyperlipidemia, unspecified: Secondary | ICD-10-CM | POA: Diagnosis not present

## 2018-05-10 DIAGNOSIS — R972 Elevated prostate specific antigen [PSA]: Secondary | ICD-10-CM | POA: Diagnosis not present

## 2018-05-10 DIAGNOSIS — R7301 Impaired fasting glucose: Secondary | ICD-10-CM | POA: Diagnosis not present

## 2018-05-10 DIAGNOSIS — Z6831 Body mass index (BMI) 31.0-31.9, adult: Secondary | ICD-10-CM | POA: Diagnosis not present

## 2018-05-10 DIAGNOSIS — J019 Acute sinusitis, unspecified: Secondary | ICD-10-CM | POA: Diagnosis not present

## 2018-05-31 ENCOUNTER — Ambulatory Visit (INDEPENDENT_AMBULATORY_CARE_PROVIDER_SITE_OTHER): Payer: Self-pay

## 2018-05-31 DIAGNOSIS — Z86010 Personal history of colonic polyps: Secondary | ICD-10-CM

## 2018-05-31 MED ORDER — NA SULFATE-K SULFATE-MG SULF 17.5-3.13-1.6 GM/177ML PO SOLN: 1.0000 | ORAL | 0 refills | Status: DC

## 2018-05-31 NOTE — Patient Instructions (Signed)
[name]  [birth year] MRN: [id number]     Procedure Date: 07/25/18 Time to register: 7:30am Place to register: [facility] Procedure Time: 8:30am Scheduled provider: R. Garfield Cornea, MD    PREPARATION FOR COLONOSCOPY WITH SUPREP BOWEL PREP KIT  Note: Suprep Bowel Prep Kit is a split-dose (2day) regimen. Consumption of BOTH 6-ounce bottles is required for a complete prep.  Please notify us immediately if you are diabetic, take iron supplements, or if you are on Coumadin or any other blood thinners.                                                                                                                                                   2 DAYS BEFORE PROCEDURE:  DATE: 07/23/18   DAY: Sunday Begin clear liquid diet AFTER your lunch meal. NO SOLID FOODS after this point.  1 DAY BEFORE PROCEDURE:  DATE: 07/24/18  DAY: Monday Continue clear liquids the entire day - NO SOLID FOOD.     At 6:00pm: Complete steps 1 through 4 below, using ONE (1) 6-ounce bottle, before going to bed. Step 1:  Pour ONE (1) 6-ounce bottle of SUPREP liquid into the mixing container.  Step 2:  Add cool drinking water to the 16 ounce line on the container and mix.  Note: Dilute the solution concentrate as directed prior to use. Step 3:  DRINK ALL the liquid in the container. Step 4:  You MUST drink an additional two (2) or more 16 ounce containers of water over the next one (1) hour.   Continue clear liquids.  DAY OF PROCEDURE:   DATE: 07/25/18   DAY: Tuesday If you take medications for your heart, blood pressure, or breathing, you may take these medications.    5 hours before your procedure at :3:30am Step 1:  Pour ONE (1) 6-ounce bottle of SUPREP liquid into the mixing container.  Step 2:  Add cool drinking water to the 16 ounce line on the container and mix.  Note: Dilute the solution concentrate as directed prior to use. Step 3:  DRINK ALL the liquid in the container. Step 4:  You MUST  drink an additional two (2) or more 16 ounce containers of water over the next one (1) hour. You MUST complete the final glass of water at least 3 hours before your colonoscopy.   Nothing by mouth past 5:30am  You may take your morning medications with sip of water unless we have instructed otherwise.    Please see below for Dietary Information.  CLEAR LIQUIDS INCLUDE:  Water Jello (NOT red in color)   Ice Popsicles (NOT red in color)   Tea (sugar ok, no milk/cream) Powdered fruit flavored drinks  Coffee (sugar ok, no milk/cream) Gatorade/ Lemonade/ Kool-Aid  (NOT red in color)   Juice: apple, white grape, white cranberry  Soft drinks  Clear bullion, consomme, broth (fat free beef/chicken/vegetable)  Carbonated beverages (any kind)  Strained chicken noodle soup Hard Candy   Remember: Clear liquids are liquids that will allow you to see your fingers on the other side of a clear glass. Be sure liquids are NOT red in color, and not cloudy, but CLEAR.  DO NOT EAT OR DRINK ANY OF THE FOLLOWING:  Dairy products of any kind   Cranberry juice Tomato juice / V8 juice   Grapefruit juice Orange juice     Red grape juice  Do not eat any solid foods, including such foods as: cereal, oatmeal, yogurt, fruits, vegetables, creamed soups, eggs, bread, crackers, pureed foods in a blender, etc.   HELPFUL HINTS FOR DRINKING PREP SOLUTION:   Make sure prep is extremely cold. Mix and refrigerate the the morning of the prep. You may also put in the freezer.   You may try mixing some Crystal Light or Country Time Lemonade if you prefer. Mix in small amounts; add more if necessary.  Try drinking through a straw  Rinse mouth with water or a mouthwash between glasses, to remove after-taste.  Try sipping on a cold beverage /ice/ popsicles between glasses of prep.  Place a piece of sugar-free hard candy in mouth between glasses.  If you become nauseated, try consuming smaller amounts, or stretch out the  time between glasses. Stop for 30-60 minutes, then slowly start back drinking.     OTHER INSTRUCTIONS  You will need a responsible adult at least 75 years of age to accompany you and drive you home. This person must remain in the waiting room during your procedure. The hospital will cancel your procedure if you do not have a responsible adult with you.   1. Wear loose fitting clothing that is easily removed. 2. Leave jewelry and other valuables at home.  3. Remove all body piercing jewelry and leave at home. 4. Total time from sign-in until discharge is approximately 2-3 hours. 5. You should go home directly after your procedure and rest. You can resume normal activities the day after your procedure. 6. The day of your procedure you should not:  Drive  Make legal decisions  Operate machinery  Drink alcohol  Return to work   You may call the office (Dept: [phone]) before 5:00pm, or page the doctor on call [phone]) after 5:00pm, for further instructions, if necessary.   Insurance Information YOU WILL NEED TO CHECK WITH YOUR INSURANCE COMPANY FOR THE BENEFITS OF COVERAGE YOU HAVE FOR THIS PROCEDURE.  UNFORTUNATELY, NOT ALL INSURANCE COMPANIES HAVE BENEFITS TO COVER ALL OR PART OF THESE TYPES OF PROCEDURES.  IT IS YOUR RESPONSIBILITY TO CHECK YOUR BENEFITS, HOWEVER, WE WILL BE GLAD TO ASSIST YOU WITH ANY CODES YOUR INSURANCE COMPANY MAY NEED.    PLEASE NOTE THAT MOST INSURANCE COMPANIES WILL NOT COVER A SCREENING COLONOSCOPY FOR PEOPLE UNDER THE AGE OF 50  IF YOU HAVE BCBS INSURANCE, YOU MAY HAVE BENEFITS FOR A SCREENING COLONOSCOPY BUT IF POLYPS ARE FOUND THE DIAGNOSIS WILL CHANGE AND THEN YOU MAY HAVE A DEDUCTIBLE THAT WILL NEED TO BE MET. SO PLEASE MAKE SURE YOU CHECK YOUR BENEFITS FOR A SCREENING COLONOSCOPY AS WELL AS A DIAGNOSTIC COLONOSCOPY.

## 2018-05-31 NOTE — Progress Notes (Signed)
Gastroenterology Pre-Procedure Review  Request Date:05/31/18 Requesting Physician: 5Year recall- last tcs 06/07/13 RMR- tubular adenoma I spoke with the patient regarding the guidelines for stopping tcs's at age 77. Pt stated he was doing well and was feeling good and would like to have one more tcs.   PATIENT REVIEW QUESTIONS: The patient responded to the following health history questions as indicated:    1. Diabetes Melitis: no 2. Joint replacements in the past 12 months: no 3. Major health problems in the past 3 months: no 4. Has an artificial valve or MVP: no 5. Has a defibrillator: no 6. Has been advised in past to take antibiotics in advance of a procedure like teeth cleaning: no 7. Family history of colon cancer: no  8. Alcohol Use: no 9. History of sleep apnea: no  10. History of coronary artery or other vascular stents placed within the last 12 months: no 11. History of any prior anesthesia complications: no    MEDICATIONS & ALLERGIES:    Patient reports the following regarding taking any blood thinners:   Plavix? no Aspirin? yes (81mg ) Coumadin? no Brilinta? no Xarelto? no Eliquis? no Pradaxa? no Savaysa? no Effient? no  Patient confirms/reports the following medications:  Current Outpatient Medications  Medication Sig Dispense Refill  . amLODipine (NORVASC) 5 MG tablet Take 5 mg by mouth daily.  5  . aspirin EC 81 MG tablet Take 81 mg by mouth daily.    . Cholecalciferol (VITAMIN D PO) Take 5,000 Units by mouth.    . Cyanocobalamin (VITAMIN B-12) 5000 MCG TBDP Take 1 tablet by mouth daily.    . Fluticasone-Salmeterol ([place]) Inhale into the lungs as needed.    . folic acid (FOLVITE) 431 MCG tablet Take 400 mcg by mouth daily.    Marland Kitchen levalbuterol (XOPENEX) 1.25 MG/3ML nebulizer solution Inhale 1.25 mg into the lungs 3 (three) times daily as needed. Reported on 12/03/2015    . levothyroxine (SYNTHROID, LEVOTHROID) 75 MCG tablet Take 75 mcg by mouth at bedtime.     . simvastatin (ZOCOR) 40 MG tablet Take 40 mg by mouth at bedtime.    . tamsulosin (FLOMAX) 0.4 MG CAPS capsule Take 0.4 mg by mouth daily.    Marland Kitchen triamterene-hydrochlorothiazide (MAXZIDE-25) 37.5-25 MG tablet Take 0.5 tablets by mouth every other day. Every other day      No current facility-administered medications for this visit.     Patient confirms/reports the following allergies:  Allergies  Allergen Reactions  . Meperidine And Related Other (See Comments)    Makes him feel like he is having a heart attack  . Sulfa Antibiotics Rash    No orders of the defined types were placed in this encounter.   AUTHORIZATION INFORMATION Primary Insurance: medicare,  ID #: [id number] Pre-Cert / Josem Kaufmann required: no   SCHEDULE INFORMATION: Procedure has been scheduled as follows:  Date: 07/25/18, Time: 8:30 Location: [facility] Dr[facility]  This Gastroenterology Pre-Precedure Review Form is being routed to the following provider(s): [facility]

## 2018-06-01 NOTE — Progress Notes (Signed)
Ok to schedule.

## 2018-06-07 DIAGNOSIS — D509 Iron deficiency anemia, unspecified: Secondary | ICD-10-CM | POA: Diagnosis not present

## 2018-06-07 DIAGNOSIS — I10 Essential (primary) hypertension: Secondary | ICD-10-CM | POA: Diagnosis not present

## 2018-06-07 DIAGNOSIS — E559 Vitamin D deficiency, unspecified: Secondary | ICD-10-CM | POA: Diagnosis not present

## 2018-06-07 DIAGNOSIS — Z79899 Other long term (current) drug therapy: Secondary | ICD-10-CM | POA: Diagnosis not present

## 2018-06-07 DIAGNOSIS — R809 Proteinuria, unspecified: Secondary | ICD-10-CM | POA: Diagnosis not present

## 2018-06-07 DIAGNOSIS — N183 Chronic kidney disease, stage 3 (moderate): Secondary | ICD-10-CM | POA: Diagnosis not present

## 2018-06-14 DIAGNOSIS — E559 Vitamin D deficiency, unspecified: Secondary | ICD-10-CM | POA: Diagnosis not present

## 2018-06-14 DIAGNOSIS — I10 Essential (primary) hypertension: Secondary | ICD-10-CM | POA: Diagnosis not present

## 2018-06-14 DIAGNOSIS — Z79899 Other long term (current) drug therapy: Secondary | ICD-10-CM | POA: Diagnosis not present

## 2018-06-14 DIAGNOSIS — E876 Hypokalemia: Secondary | ICD-10-CM | POA: Diagnosis not present

## 2018-06-14 DIAGNOSIS — N183 Chronic kidney disease, stage 3 (moderate): Secondary | ICD-10-CM | POA: Diagnosis not present

## 2018-06-22 ENCOUNTER — Other Ambulatory Visit: Payer: Self-pay

## 2018-06-27 ENCOUNTER — Telehealth: Payer: Self-pay

## 2018-06-27 NOTE — Telephone Encounter (Signed)
Pt wants to cancel his TCS on 07/25/18 with RMR. He doesn't want to r/s and doesn't like the side affects listed for the prep. Pt said he is 16 and doesn't wish to r/s.

## 2018-06-27 NOTE — Telephone Encounter (Signed)
noted 

## 2018-06-27 NOTE — Telephone Encounter (Signed)
Called endo and cancelled procedure. FYI to [place]

## 2018-07-25 ENCOUNTER — Encounter (HOSPITAL_COMMUNITY): Payer: Self-pay

## 2018-07-25 ENCOUNTER — Ambulatory Visit (HOSPITAL_COMMUNITY): Admit: 2018-07-25 | Payer: Medicare Other | Admitting: Internal Medicine

## 2018-07-25 SURGERY — COLONOSCOPY
Anesthesia: Moderate Sedation

## 2018-07-28 ENCOUNTER — Other Ambulatory Visit (HOSPITAL_COMMUNITY): Payer: Self-pay

## 2018-07-28 DIAGNOSIS — E538 Deficiency of other specified B group vitamins: Secondary | ICD-10-CM

## 2018-08-02 ENCOUNTER — Inpatient Hospital Stay (HOSPITAL_COMMUNITY): Payer: Medicare Other | Attending: Hematology

## 2018-08-02 DIAGNOSIS — E039 Hypothyroidism, unspecified: Secondary | ICD-10-CM | POA: Insufficient documentation

## 2018-08-02 DIAGNOSIS — N189 Chronic kidney disease, unspecified: Secondary | ICD-10-CM | POA: Insufficient documentation

## 2018-08-02 DIAGNOSIS — I129 Hypertensive chronic kidney disease with stage 1 through stage 4 chronic kidney disease, or unspecified chronic kidney disease: Secondary | ICD-10-CM | POA: Diagnosis not present

## 2018-08-02 DIAGNOSIS — D472 Monoclonal gammopathy: Secondary | ICD-10-CM | POA: Insufficient documentation

## 2018-08-02 DIAGNOSIS — Z79899 Other long term (current) drug therapy: Secondary | ICD-10-CM | POA: Insufficient documentation

## 2018-08-02 DIAGNOSIS — E538 Deficiency of other specified B group vitamins: Secondary | ICD-10-CM | POA: Insufficient documentation

## 2018-08-02 LAB — COMPREHENSIVE METABOLIC PANEL
ALK PHOS: 64 U/L (ref 38–126)
ALT: 20 U/L (ref 0–44)
AST: 28 U/L (ref 15–41)
Albumin: 4.3 g/dL (ref 3.5–5.0)
Anion gap: 10 (ref 5–15)
BUN: 20 mg/dL (ref 8–23)
CALCIUM: 9.4 mg/dL (ref 8.9–10.3)
CHLORIDE: 104 mmol/L (ref 98–111)
CO2: 28 mmol/L (ref 22–32)
CREATININE: 1.67 mg/dL — AB (ref 0.61–1.24)
GFR calc non Af Amer: 38 mL/min — ABNORMAL LOW (ref >60)
GFR, EST AFRICAN AMERICAN: 45 mL/min — AB (ref >60)
GLUCOSE: 124 mg/dL — AB (ref 70–99)
Potassium: 3.7 mmol/L (ref 3.5–5.1)
SODIUM: 142 mmol/L (ref 135–145)
Total Bilirubin: 0.6 mg/dL (ref 0.3–1.2)
Total Protein: 7.6 g/dL (ref 6.5–8.1)

## 2018-08-02 LAB — CBC WITH DIFFERENTIAL/PLATELET
BASOS ABS: 0.1 10*3/uL (ref 0.0–0.1)
Basophils Relative: 1 %
EOS ABS: 0.9 10*3/uL — AB (ref 0.0–0.7)
Eosinophils Relative: 9 %
HCT: 43.9 % (ref 39.0–52.0)
HEMOGLOBIN: 14.9 g/dL (ref 13.0–17.0)
LYMPHS ABS: 2.5 10*3/uL (ref 0.7–4.0)
LYMPHS PCT: 24 %
MCH: 35.3 pg — ABNORMAL HIGH (ref 26.0–34.0)
MCHC: 33.9 g/dL (ref 30.0–36.0)
MCV: 104 fL — ABNORMAL HIGH (ref 78.0–100.0)
Monocytes Absolute: 0.9 10*3/uL (ref 0.1–1.0)
Monocytes Relative: 8 %
NEUTROS PCT: 58 %
Neutro Abs: 6.3 10*3/uL (ref 1.7–7.7)
Platelets: 270 10*3/uL (ref 150–400)
RBC: 4.22 MIL/uL (ref 4.22–5.81)
RDW: 13 % (ref 11.5–15.5)
WBC: 10.6 10*3/uL — AB (ref 4.0–10.5)

## 2018-08-02 LAB — VITAMIN B12: VITAMIN B 12: 2365 pg/mL — AB (ref 180–914)

## 2018-08-03 LAB — KAPPA/LAMBDA LIGHT CHAINS
KAPPA FREE LGHT CHN: 27.6 mg/L — AB (ref 3.3–19.4)
Kappa, lambda light chain ratio: 2.38 — ABNORMAL HIGH (ref 0.26–1.65)
LAMDA FREE LIGHT CHAINS: 11.6 mg/L (ref 5.7–26.3)

## 2018-08-03 LAB — MULTIPLE MYELOMA PANEL, SERUM
ALPHA2 GLOB SERPL ELPH-MCNC: 0.8 g/dL (ref 0.4–1.0)
Albumin SerPl Elph-Mcnc: 3.9 g/dL (ref 2.9–4.4)
Albumin/Glob SerPl: 1.3 (ref 0.7–1.7)
Alpha 1: 0.3 g/dL (ref 0.0–0.4)
B-GLOBULIN SERPL ELPH-MCNC: 1.1 g/dL (ref 0.7–1.3)
Gamma Glob SerPl Elph-Mcnc: 0.9 g/dL (ref 0.4–1.8)
Globulin, Total: 3.1 g/dL (ref 2.2–3.9)
IGG (IMMUNOGLOBIN G), SERUM: 1030 mg/dL (ref 700–1600)
IgA: 166 mg/dL (ref 61–437)
IgM (Immunoglobulin M), Srm: 40 mg/dL (ref 15–143)
Total Protein ELP: 7 g/dL (ref 6.0–8.5)

## 2018-08-03 LAB — BETA 2 MICROGLOBULIN, SERUM: Beta-2 Microglobulin: 1.9 mg/L (ref 0.6–2.4)

## 2018-08-07 ENCOUNTER — Encounter (HOSPITAL_COMMUNITY): Payer: Self-pay | Admitting: Internal Medicine

## 2018-08-07 ENCOUNTER — Other Ambulatory Visit: Payer: Self-pay

## 2018-08-07 ENCOUNTER — Inpatient Hospital Stay (HOSPITAL_BASED_OUTPATIENT_CLINIC_OR_DEPARTMENT_OTHER): Payer: Medicare Other | Admitting: Internal Medicine

## 2018-08-07 VITALS — BP 124/69 | HR 82 | Temp 97.8°F | Resp 16 | Wt 216.1 lb

## 2018-08-07 DIAGNOSIS — D472 Monoclonal gammopathy: Secondary | ICD-10-CM | POA: Diagnosis not present

## 2018-08-07 DIAGNOSIS — E039 Hypothyroidism, unspecified: Secondary | ICD-10-CM | POA: Diagnosis not present

## 2018-08-07 DIAGNOSIS — N189 Chronic kidney disease, unspecified: Secondary | ICD-10-CM

## 2018-08-07 DIAGNOSIS — I129 Hypertensive chronic kidney disease with stage 1 through stage 4 chronic kidney disease, or unspecified chronic kidney disease: Secondary | ICD-10-CM | POA: Diagnosis not present

## 2018-08-07 DIAGNOSIS — E538 Deficiency of other specified B group vitamins: Secondary | ICD-10-CM

## 2018-08-07 DIAGNOSIS — Z79899 Other long term (current) drug therapy: Secondary | ICD-10-CM | POA: Diagnosis not present

## 2018-08-07 NOTE — Patient Instructions (Addendum)
[place] at [facility] Discharge Instructions   You were seen today by Dr. Zoila Shutter   Your lab work was reviewed with you. All of your lab work was normal.  Your bone marrow biopsy was normal.  Follow up with Dr. Nevada Crane about having your colonoscopy.  You do not have to follow up with [place] unless advised by Dr. Nevada Crane  Thank you for choosing [place] at [facility] to provide your oncology and hematology care.  To afford each patient quality time with our provider, please arrive at least 15 minutes before your scheduled appointment time.    If you have a lab appointment with the [place] [facility] please come in thru the  Main Entrance and check in at the main information desk  You need to re-schedule your appointment should you arrive 10 or more minutes late.  We strive to give you quality time with our providers, and arriving late affects you and other patients whose appointments are after yours.  Also, if you no show three or more times for appointments you may be dismissed from the clinic at the providers discretion.     Again, thank you for choosing [facility].  Our hope is that these requests will decrease the amount of time that you wait before being seen by our physicians.       _____________________________________________________________  Should you have questions after your visit to [facility], please contact our office at [phone] between the hours of 8:30 a.m. and 4:30 p.m.  Voicemails left after 4:30 p.m. will not be returned until the following business day.  For prescription refill requests, have your pharmacy contact our office.       Resources For Cancer Patients and their Caregivers ? American Cancer Society: Can assist with transportation, wigs, general needs, runs Look Good Feel Better.        2406222221 ? Cancer Care: Provides financial assistance, online support groups,  medication/co-pay assistance.  1-800-813-HOPE 670-809-8501) ? [place] Assists [place] cancer patients and their families through emotional , educational and financial support.  570-741-9302 ? [place] DSS Where to apply for food stamps, Medicaid and utility assistance. 602-223-0056 ? [employer]: Transportation to medical appointments. (402) 477-2083 ? Social Security Administration: May apply for disability if have a Stage IV cancer. 613-338-8093 (628)846-7574 ? [employer], Disability and Transit Services: Assists with nutrition, care and transit needs. [place] Support Programs:   > Cancer Support Group  2nd Tuesday of the month 1pm-2pm, Journey Room   > Creative Journey  3rd Tuesday of the month 1130am-1pm, Journey Room

## 2018-08-07 NOTE — Progress Notes (Signed)
Diagnosis No diagnosis found.  Staging Cancer Staging No matching staging information was found for the patient.  Assessment and Plan:  1.  Mild plasmacytosis on Bone Marrow Biopsy done 01/13/2016 showed 7% plasma cells that were polyclonal with no evidence of monoclonal gammopathy.  Skeletal survey done 12/03/2015 was WNL.    Labs done 08/02/2018 showed WBC 10.6 HB 14.9 plts 270.000.  Chemistries WNL with K+ 3.7 Ca++ 9.4 and normal LFTs.  SPEP show no monoclonal spike, he has normal quantitative IG.  Kappa/Lambda ratio of 2.38.  Pt has option of 1 year follow-up with labs or prn follow-up.  He prefers prn follow-up.  Pt advised to notify the office if any change in symptoms.    2.  CKD.  Cr is 1.67.  Follow-up with nephrology as recommended.    3.  HTN.  BP is 124/69.  Follow-up with PCP.    4.  Hypothyroidism.  Follow-up with PCP for monitoring.    5.  B12 deficiency.  Pt on B12.  Continue as directed.    Current Status:  Pt is seen today for follow-up.  Pt is here to go over labs.    Problem List Patient Active Problem List   Diagnosis Date Noted  . Vitamin B 12 deficiency [E53.8] 09/30/2016  . MGUS (monoclonal gammopathy of unknown significance) [D47.2] 12/03/2015  . Personal history of colonic polyps [Z86.010] 05/21/2013  . Pain in joint, ankle and foot [M25.579] 06/10/2011  . Sprain of ankle, unspecified site [S93.409A] 06/10/2011    Past Medical History Past Medical History:  Diagnosis Date  . Asthma   . Blood transfusion abn reaction or complication, no procedure mishap 1979   reaction due to wrong blood type given  . GERD (gastroesophageal reflux disease)    occasional  . H/O: pneumonia 02/27/2015  . Hyperlipidemia   . Hypertension   . Hypothyroidism   . Myocardial infarction (HCC)    hx of abnormal ekg showed prior myocardial infarction  . Renal disorder   . Vitamin B 12 deficiency 09/30/2016    Past Surgical History Past Surgical History:  Procedure  Laterality Date  . APPENDECTOMY  1963  . CHOLECYSTECTOMY  1979  . COLONOSCOPY  03/28/2008   SHF:WYOVZC rectum; left-sided transverse diverticula diminutive polyp; descending colon. Adenomas.  . COLONOSCOPY N/A 06/07/2013   Procedure: COLONOSCOPY;  Surgeon: Daneil Dolin, MD;  [facility];  Service: Endoscopy;  Laterality: N/A;  9:45  . HERNIA REPAIR  2005  . LUMBAR LAMINECTOMY/DECOMPRESSION MICRODISCECTOMY  07/26/2012   Procedure: LUMBAR LAMINECTOMY/DECOMPRESSION MICRODISCECTOMY;  Surgeon: Johnn Hai, MD;  Location: [facility];  Service: Orthopedics;  Laterality: N/A;  Lumbar Decompression L4-L5  . STOMACH SURGERY  2003   exploratory surgery, fatty tumors with obstruction?  . TONSILLECTOMY  1962    Family History Family History  Problem Relation Age of Onset  . Thyroid disease Mother   . Heart disease Mother   . Hypertension Mother   . Clotting disorder Father   . Hypertension Father   . Cancer Sister   . Hypertension Sister   . Colon cancer Neg Hx      Social History  reports that he has never smoked. He has never used smokeless tobacco. He reports that he drinks alcohol. He reports that he does not use drugs.  Medications  Current Outpatient Medications:  .  amLODipine (NORVASC) 5 MG tablet, Take 5 mg by mouth daily., Disp: , Rfl: 5 .  aspirin EC 81 MG tablet,  Take 81 mg by mouth daily., Disp: , Rfl:  .  Cholecalciferol (VITAMIN D PO), Take 5,000 Units by mouth., Disp: , Rfl:  .  Cyanocobalamin (VITAMIN B-12) 5000 MCG TBDP, Take 1 tablet by mouth daily., Disp: , Rfl:  .  Fluticasone-Salmeterol (WIXELA INHUB IN), Inhale into the lungs as needed., Disp: , Rfl:  .  folic acid (FOLVITE) 694 MCG tablet, Take 400 mcg by mouth daily., Disp: , Rfl:  .  levalbuterol (XOPENEX) 1.25 MG/3ML nebulizer solution, Inhale 1.25 mg into the lungs 3 (three) times daily as needed. Reported on 12/03/2015, Disp: , Rfl:  .  levothyroxine (SYNTHROID, LEVOTHROID) 75 MCG tablet, Take 75 mcg  by mouth at bedtime., Disp: , Rfl:  .  Na Sulfate-K Sulfate-Mg Sulf (SUPREP BOWEL PREP KIT) 17.5-3.13-1.6 GM/177ML SOLN, Take 1 kit by mouth as directed., Disp: 1 Bottle, Rfl: 0 .  simvastatin (ZOCOR) 40 MG tablet, Take 40 mg by mouth at bedtime., Disp: , Rfl:  .  tamsulosin (FLOMAX) 0.4 MG CAPS capsule, Take 0.4 mg by mouth daily., Disp: , Rfl:  .  triamterene-hydrochlorothiazide (DYAZIDE) 37.5-25 MG capsule, Take 1 capsule by mouth daily., Disp: , Rfl: 5  Allergies Meperidine and related and Sulfa antibiotics  Review of Systems Review of Systems - Oncology ROS negative   Physical Exam  Vitals Wt Readings from Last 3 Encounters:  08/07/18 216 lb 1.6 oz (98 kg)  08/09/17 224 lb (101.6 kg)  07/27/16 222 lb 6.4 oz (100.9 kg)   Temp Readings from Last 3 Encounters:  08/07/18 97.8 F (36.6 C) (Oral)  07/22/17 97.9 F (36.6 C)  06/16/17 97.7 F (36.5 C) (Oral)   BP Readings from Last 3 Encounters:  08/07/18 124/69  08/09/17 139/83  07/22/17 (!) 151/76   Pulse Readings from Last 3 Encounters:  08/07/18 82  08/09/17 77  07/22/17 85    Constitutional: Well-developed, well-nourished, and in no distress.   HENT: Head: Normocephalic and atraumatic.  Mouth/Throat: No oropharyngeal exudate. Mucosa moist. Eyes: Pupils are equal, round, and reactive to light. Conjunctivae are normal. No scleral icterus.  Neck: Normal range of motion. Neck supple. No JVD present.  Cardiovascular: Normal rate, regular rhythm and normal heart sounds.  Exam reveals no gallop and no friction rub.   No murmur heard. Pulmonary/Chest: Effort normal and breath sounds normal. No respiratory distress. No wheezes.No rales.  Abdominal: Soft. Bowel sounds are normal. No distension. There is no tenderness. There is no guarding.  Musculoskeletal: No edema or tenderness.  Lymphadenopathy: No cervical, axillary or supraclavicular adenopathy.  Neurological: Alert and oriented to person, place, and time. No cranial  nerve deficit.  Skin: Skin is warm and dry. No rash noted. No erythema. No pallor.  Psychiatric: Affect and judgment normal.   Labs No visits with results within 3 Day(s) from this visit.  Latest known visit with results is:  Appointment on 08/02/2018  Component Date Value Ref Range Status  . WBC 08/02/2018 10.6* 4.0 - 10.5 K/uL Final  . RBC 08/02/2018 4.22  4.22 - 5.81 MIL/uL Final  . Hemoglobin 08/02/2018 14.9  13.0 - 17.0 g/dL Final  . HCT 08/02/2018 43.9  39.0 - 52.0 % Final  . MCV 08/02/2018 104.0* 78.0 - 100.0 fL Final  . MCH 08/02/2018 35.3* 26.0 - 34.0 pg Final  . MCHC 08/02/2018 33.9  30.0 - 36.0 g/dL Final  . RDW 08/02/2018 13.0  11.5 - 15.5 % Final  . Platelets 08/02/2018 270  150 - 400 K/uL Final  .  Neutrophils Relative % 08/02/2018 58  % Final  . Neutro Abs 08/02/2018 6.3  1.7 - 7.7 K/uL Final  . Lymphocytes Relative 08/02/2018 24  % Final  . Lymphs Abs 08/02/2018 2.5  0.7 - 4.0 K/uL Final  . Monocytes Relative 08/02/2018 8  % Final  . Monocytes Absolute 08/02/2018 0.9  0.1 - 1.0 K/uL Final  . Eosinophils Relative 08/02/2018 9  % Final  . Eosinophils Absolute 08/02/2018 0.9* 0.0 - 0.7 K/uL Final  . Basophils Relative 08/02/2018 1  % Final  . Basophils Absolute 08/02/2018 0.1  0.0 - 0.1 K/uL Final   Performed at [facility], [address], [place], [place] [address]  . Sodium 08/02/2018 142  135 - 145 mmol/L Final  . Potassium 08/02/2018 3.7  3.5 - 5.1 mmol/L Final  . Chloride 08/02/2018 104  98 - 111 mmol/L Final  . CO2 08/02/2018 28  22 - 32 mmol/L Final  . Glucose, Bld 08/02/2018 124* 70 - 99 mg/dL Final  . BUN 08/02/2018 20  8 - 23 mg/dL Final  . Creatinine, Ser 08/02/2018 1.67* 0.61 - 1.24 mg/dL Final  . Calcium 08/02/2018 9.4  8.9 - 10.3 mg/dL Final  . Total Protein 08/02/2018 7.6  6.5 - 8.1 g/dL Final  . Albumin 08/02/2018 4.3  3.5 - 5.0 g/dL Final  . AST 08/02/2018 28  15 - 41 U/L Final  . ALT 08/02/2018 20  0 - 44 U/L Final  . Alkaline Phosphatase  08/02/2018 64  38 - 126 U/L Final  . Total Bilirubin 08/02/2018 0.6  0.3 - 1.2 mg/dL Final  . GFR calc non Af Amer 08/02/2018 38* >60 mL/min Final  . GFR calc Af Amer 08/02/2018 45* >60 mL/min Final   Comment: (NOTE) The eGFR has been calculated using the CKD EPI equation. This calculation has not been validated in all clinical situations. eGFR's persistently <60 mL/min signify possible Chronic Kidney Disease.   Georgiann Hahn gap 08/02/2018 10  5 - 15 Final   Performed at [facility], [address], [place], [place] [address]  . IgG (Immunoglobin G), Serum 08/02/2018 1,030  700 - 1,600 mg/dL Final  . IgA 08/02/2018 166  61 - 437 mg/dL Final  . IgM (Immunoglobulin M), Srm 08/02/2018 40  15 - 143 mg/dL Final  . Total Protein ELP 08/02/2018 7.0  6.0 - 8.5 g/dL Corrected  . Albumin SerPl Elph-Mcnc 08/02/2018 3.9  2.9 - 4.4 g/dL Corrected  . Alpha 1 08/02/2018 0.3  0.0 - 0.4 g/dL Corrected  . Alpha2 Glob SerPl Elph-Mcnc 08/02/2018 0.8  0.4 - 1.0 g/dL Corrected  . B-Globulin SerPl Elph-Mcnc 08/02/2018 1.1  0.7 - 1.3 g/dL Corrected  . Gamma Glob SerPl Elph-Mcnc 08/02/2018 0.9  0.4 - 1.8 g/dL Corrected  . M Protein SerPl Elph-Mcnc 08/02/2018 Not Observed  Not Observed g/dL Corrected  . Globulin, Total 08/02/2018 3.1  2.2 - 3.9 g/dL Corrected  . Albumin/Glob SerPl 08/02/2018 1.3  0.7 - 1.7 Corrected  . IFE 1 08/02/2018 Comment   Corrected   An apparent normal immunofixation pattern.  . Please Note 08/02/2018 Comment   Corrected   Comment: (NOTE) Protein electrophoresis scan will follow via computer, mail, or courier delivery. Performed At: [facility] [place], [place] [id number] Rush Farmer MD [id number]   . Kappa free light chain 08/02/2018 27.6* 3.3 - 19.4 mg/L Final  . Lamda free light chains 08/02/2018 11.6  5.7 - 26.3 mg/L Final  . Kappa, lamda light chain ratio  08/02/2018 2.38* 0.26 - 1.65 Final   Comment: (NOTE) Performed At: [facility] [place], [place] [id number] Rush Farmer MD [id number]   . Beta-2 Microglobulin 08/02/2018 1.9  0.6 - 2.4 mg/L Final   Comment: (NOTE) Siemens Immulite 2000 Immunochemiluminometric assay (ICMA) Values obtained with different assay methods or kits cannot be used interchangeably. Results cannot be interpreted as absolute evidence of the presence or absence of malignant disease. Performed At: [facility] [place], [place] [id number] Rush Farmer MD [id number]   . Vitamin B-12 08/02/2018 2,365* 180 - 914 pg/mL Final   Comment: RESULTS CONFIRMED BY MANUAL DILUTION Performed at [facility], [address], [place], [place] [address]      Pathology No orders of the defined types were placed in this encounter.      Zoila Shutter MD

## 2018-08-09 ENCOUNTER — Ambulatory Visit (HOSPITAL_COMMUNITY): Payer: BC Managed Care – PPO | Admitting: Internal Medicine

## 2018-09-11 DIAGNOSIS — B0239 Other herpes zoster eye disease: Secondary | ICD-10-CM | POA: Diagnosis not present

## 2018-09-11 DIAGNOSIS — Z6831 Body mass index (BMI) 31.0-31.9, adult: Secondary | ICD-10-CM | POA: Diagnosis not present

## 2018-09-27 DIAGNOSIS — B029 Zoster without complications: Secondary | ICD-10-CM | POA: Diagnosis not present

## 2018-09-27 DIAGNOSIS — R079 Chest pain, unspecified: Secondary | ICD-10-CM | POA: Diagnosis not present

## 2018-10-11 DIAGNOSIS — D649 Anemia, unspecified: Secondary | ICD-10-CM | POA: Diagnosis not present

## 2018-10-11 DIAGNOSIS — I10 Essential (primary) hypertension: Secondary | ICD-10-CM | POA: Diagnosis not present

## 2018-10-11 DIAGNOSIS — E559 Vitamin D deficiency, unspecified: Secondary | ICD-10-CM | POA: Diagnosis not present

## 2018-10-11 DIAGNOSIS — N183 Chronic kidney disease, stage 3 (moderate): Secondary | ICD-10-CM | POA: Diagnosis not present

## 2018-10-11 DIAGNOSIS — R809 Proteinuria, unspecified: Secondary | ICD-10-CM | POA: Diagnosis not present

## 2018-10-11 DIAGNOSIS — Z79899 Other long term (current) drug therapy: Secondary | ICD-10-CM | POA: Diagnosis not present

## 2018-10-18 DIAGNOSIS — D472 Monoclonal gammopathy: Secondary | ICD-10-CM | POA: Diagnosis not present

## 2018-10-18 DIAGNOSIS — E559 Vitamin D deficiency, unspecified: Secondary | ICD-10-CM | POA: Diagnosis not present

## 2018-10-18 DIAGNOSIS — N183 Chronic kidney disease, stage 3 (moderate): Secondary | ICD-10-CM | POA: Diagnosis not present

## 2018-10-27 DIAGNOSIS — N281 Cyst of kidney, acquired: Secondary | ICD-10-CM | POA: Diagnosis not present

## 2018-10-27 DIAGNOSIS — B0223 Postherpetic polyneuropathy: Secondary | ICD-10-CM | POA: Diagnosis not present

## 2018-11-13 DIAGNOSIS — B0223 Postherpetic polyneuropathy: Secondary | ICD-10-CM | POA: Diagnosis not present

## 2019-01-09 DIAGNOSIS — L821 Other seborrheic keratosis: Secondary | ICD-10-CM | POA: Diagnosis not present

## 2019-01-09 DIAGNOSIS — D485 Neoplasm of uncertain behavior of skin: Secondary | ICD-10-CM | POA: Diagnosis not present

## 2019-01-09 DIAGNOSIS — C4441 Basal cell carcinoma of skin of scalp and neck: Secondary | ICD-10-CM | POA: Diagnosis not present

## 2019-01-09 DIAGNOSIS — Z23 Encounter for immunization: Secondary | ICD-10-CM | POA: Diagnosis not present

## 2019-01-09 DIAGNOSIS — L570 Actinic keratosis: Secondary | ICD-10-CM | POA: Diagnosis not present

## 2019-01-09 DIAGNOSIS — D225 Melanocytic nevi of trunk: Secondary | ICD-10-CM | POA: Diagnosis not present

## 2019-01-09 DIAGNOSIS — D2262 Melanocytic nevi of left upper limb, including shoulder: Secondary | ICD-10-CM | POA: Diagnosis not present

## 2019-03-20 DIAGNOSIS — W57XXXD Bitten or stung by nonvenomous insect and other nonvenomous arthropods, subsequent encounter: Secondary | ICD-10-CM | POA: Diagnosis not present

## 2019-03-20 DIAGNOSIS — L0390 Cellulitis, unspecified: Secondary | ICD-10-CM | POA: Diagnosis not present

## 2019-04-02 DIAGNOSIS — E559 Vitamin D deficiency, unspecified: Secondary | ICD-10-CM | POA: Diagnosis not present

## 2019-04-02 DIAGNOSIS — E039 Hypothyroidism, unspecified: Secondary | ICD-10-CM | POA: Diagnosis not present

## 2019-04-02 DIAGNOSIS — E785 Hyperlipidemia, unspecified: Secondary | ICD-10-CM | POA: Diagnosis not present

## 2019-04-02 DIAGNOSIS — N183 Chronic kidney disease, stage 3 (moderate): Secondary | ICD-10-CM | POA: Diagnosis not present

## 2019-04-02 DIAGNOSIS — I10 Essential (primary) hypertension: Secondary | ICD-10-CM | POA: Diagnosis not present

## 2019-04-02 DIAGNOSIS — R7301 Impaired fasting glucose: Secondary | ICD-10-CM | POA: Diagnosis not present

## 2019-04-02 DIAGNOSIS — E782 Mixed hyperlipidemia: Secondary | ICD-10-CM | POA: Diagnosis not present

## 2019-04-03 DIAGNOSIS — N183 Chronic kidney disease, stage 3 (moderate): Secondary | ICD-10-CM | POA: Diagnosis not present

## 2019-04-03 DIAGNOSIS — R972 Elevated prostate specific antigen [PSA]: Secondary | ICD-10-CM | POA: Diagnosis not present

## 2019-04-03 DIAGNOSIS — R7301 Impaired fasting glucose: Secondary | ICD-10-CM | POA: Diagnosis not present

## 2019-04-03 DIAGNOSIS — I129 Hypertensive chronic kidney disease with stage 1 through stage 4 chronic kidney disease, or unspecified chronic kidney disease: Secondary | ICD-10-CM | POA: Diagnosis not present

## 2019-04-03 DIAGNOSIS — E782 Mixed hyperlipidemia: Secondary | ICD-10-CM | POA: Diagnosis not present

## 2019-04-03 DIAGNOSIS — N401 Enlarged prostate with lower urinary tract symptoms: Secondary | ICD-10-CM | POA: Diagnosis not present

## 2019-04-03 DIAGNOSIS — E039 Hypothyroidism, unspecified: Secondary | ICD-10-CM | POA: Diagnosis not present

## 2019-04-03 DIAGNOSIS — J4530 Mild persistent asthma, uncomplicated: Secondary | ICD-10-CM | POA: Diagnosis not present

## 2019-04-03 DIAGNOSIS — L0390 Cellulitis, unspecified: Secondary | ICD-10-CM | POA: Diagnosis not present

## 2019-04-03 DIAGNOSIS — E559 Vitamin D deficiency, unspecified: Secondary | ICD-10-CM | POA: Diagnosis not present

## 2019-04-11 DIAGNOSIS — C4441 Basal cell carcinoma of skin of scalp and neck: Secondary | ICD-10-CM | POA: Diagnosis not present

## 2019-04-11 DIAGNOSIS — Z0000 Encounter for general adult medical examination without abnormal findings: Secondary | ICD-10-CM | POA: Diagnosis not present

## 2019-07-10 DIAGNOSIS — L570 Actinic keratosis: Secondary | ICD-10-CM | POA: Diagnosis not present

## 2019-07-10 DIAGNOSIS — Z85828 Personal history of other malignant neoplasm of skin: Secondary | ICD-10-CM | POA: Diagnosis not present

## 2019-07-10 DIAGNOSIS — D225 Melanocytic nevi of trunk: Secondary | ICD-10-CM | POA: Diagnosis not present

## 2019-07-10 DIAGNOSIS — L821 Other seborrheic keratosis: Secondary | ICD-10-CM | POA: Diagnosis not present

## 2019-07-24 ENCOUNTER — Ambulatory Visit (INDEPENDENT_AMBULATORY_CARE_PROVIDER_SITE_OTHER): Payer: Medicare Other | Admitting: Urology

## 2019-07-24 DIAGNOSIS — N5201 Erectile dysfunction due to arterial insufficiency: Secondary | ICD-10-CM

## 2019-07-24 DIAGNOSIS — R351 Nocturia: Secondary | ICD-10-CM

## 2019-07-24 DIAGNOSIS — N401 Enlarged prostate with lower urinary tract symptoms: Secondary | ICD-10-CM | POA: Diagnosis not present

## 2019-09-14 DIAGNOSIS — E559 Vitamin D deficiency, unspecified: Secondary | ICD-10-CM | POA: Diagnosis not present

## 2019-09-14 DIAGNOSIS — I10 Essential (primary) hypertension: Secondary | ICD-10-CM | POA: Diagnosis not present

## 2019-09-14 DIAGNOSIS — R809 Proteinuria, unspecified: Secondary | ICD-10-CM | POA: Diagnosis not present

## 2019-09-14 DIAGNOSIS — D631 Anemia in chronic kidney disease: Secondary | ICD-10-CM | POA: Diagnosis not present

## 2019-09-14 DIAGNOSIS — Z79899 Other long term (current) drug therapy: Secondary | ICD-10-CM | POA: Diagnosis not present

## 2019-09-14 DIAGNOSIS — N1831 Chronic kidney disease, stage 3a: Secondary | ICD-10-CM | POA: Diagnosis not present

## 2019-09-19 DIAGNOSIS — N1832 Chronic kidney disease, stage 3b: Secondary | ICD-10-CM | POA: Diagnosis not present

## 2019-09-19 DIAGNOSIS — D472 Monoclonal gammopathy: Secondary | ICD-10-CM | POA: Diagnosis not present

## 2019-09-19 DIAGNOSIS — E211 Secondary hyperparathyroidism, not elsewhere classified: Secondary | ICD-10-CM | POA: Diagnosis not present

## 2019-09-19 DIAGNOSIS — E559 Vitamin D deficiency, unspecified: Secondary | ICD-10-CM | POA: Diagnosis not present

## 2019-10-11 DIAGNOSIS — E039 Hypothyroidism, unspecified: Secondary | ICD-10-CM | POA: Diagnosis not present

## 2019-10-11 DIAGNOSIS — R7301 Impaired fasting glucose: Secondary | ICD-10-CM | POA: Diagnosis not present

## 2019-10-11 DIAGNOSIS — E559 Vitamin D deficiency, unspecified: Secondary | ICD-10-CM | POA: Diagnosis not present

## 2019-10-11 DIAGNOSIS — E782 Mixed hyperlipidemia: Secondary | ICD-10-CM | POA: Diagnosis not present

## 2019-10-11 DIAGNOSIS — I10 Essential (primary) hypertension: Secondary | ICD-10-CM | POA: Diagnosis not present

## 2019-10-11 DIAGNOSIS — E785 Hyperlipidemia, unspecified: Secondary | ICD-10-CM | POA: Diagnosis not present

## 2019-10-16 DIAGNOSIS — J069 Acute upper respiratory infection, unspecified: Secondary | ICD-10-CM | POA: Diagnosis not present

## 2019-10-16 DIAGNOSIS — Z0001 Encounter for general adult medical examination with abnormal findings: Secondary | ICD-10-CM | POA: Diagnosis not present

## 2019-10-16 DIAGNOSIS — Z6831 Body mass index (BMI) 31.0-31.9, adult: Secondary | ICD-10-CM | POA: Diagnosis not present

## 2019-10-16 DIAGNOSIS — E785 Hyperlipidemia, unspecified: Secondary | ICD-10-CM | POA: Diagnosis not present

## 2019-10-16 DIAGNOSIS — R7301 Impaired fasting glucose: Secondary | ICD-10-CM | POA: Diagnosis not present

## 2019-10-16 DIAGNOSIS — J4530 Mild persistent asthma, uncomplicated: Secondary | ICD-10-CM | POA: Diagnosis not present

## 2019-10-16 DIAGNOSIS — E039 Hypothyroidism, unspecified: Secondary | ICD-10-CM | POA: Diagnosis not present

## 2019-10-16 DIAGNOSIS — I10 Essential (primary) hypertension: Secondary | ICD-10-CM | POA: Diagnosis not present

## 2019-10-16 DIAGNOSIS — E559 Vitamin D deficiency, unspecified: Secondary | ICD-10-CM | POA: Diagnosis not present

## 2019-10-16 DIAGNOSIS — R972 Elevated prostate specific antigen [PSA]: Secondary | ICD-10-CM | POA: Diagnosis not present

## 2019-10-16 DIAGNOSIS — J45901 Unspecified asthma with (acute) exacerbation: Secondary | ICD-10-CM | POA: Diagnosis not present

## 2019-10-16 DIAGNOSIS — E875 Hyperkalemia: Secondary | ICD-10-CM | POA: Diagnosis not present

## 2019-10-16 DIAGNOSIS — J060 Acute laryngopharyngitis: Secondary | ICD-10-CM | POA: Diagnosis not present

## 2019-10-16 DIAGNOSIS — Z23 Encounter for immunization: Secondary | ICD-10-CM | POA: Diagnosis not present

## 2019-10-16 DIAGNOSIS — E782 Mixed hyperlipidemia: Secondary | ICD-10-CM | POA: Diagnosis not present

## 2019-10-16 DIAGNOSIS — W57XXXD Bitten or stung by nonvenomous insect and other nonvenomous arthropods, subsequent encounter: Secondary | ICD-10-CM | POA: Diagnosis not present

## 2019-10-16 DIAGNOSIS — J019 Acute sinusitis, unspecified: Secondary | ICD-10-CM | POA: Diagnosis not present

## 2019-10-16 DIAGNOSIS — I129 Hypertensive chronic kidney disease with stage 1 through stage 4 chronic kidney disease, or unspecified chronic kidney disease: Secondary | ICD-10-CM | POA: Diagnosis not present

## 2019-10-16 DIAGNOSIS — N401 Enlarged prostate with lower urinary tract symptoms: Secondary | ICD-10-CM | POA: Diagnosis not present

## 2019-10-17 ENCOUNTER — Other Ambulatory Visit: Payer: Self-pay

## 2019-12-04 DIAGNOSIS — Z23 Encounter for immunization: Secondary | ICD-10-CM | POA: Diagnosis not present

## 2020-01-04 DIAGNOSIS — Z23 Encounter for immunization: Secondary | ICD-10-CM | POA: Diagnosis not present

## 2020-01-18 DIAGNOSIS — R238 Other skin changes: Secondary | ICD-10-CM | POA: Diagnosis not present

## 2020-01-18 DIAGNOSIS — Z86018 Personal history of other benign neoplasm: Secondary | ICD-10-CM | POA: Diagnosis not present

## 2020-01-18 DIAGNOSIS — L578 Other skin changes due to chronic exposure to nonionizing radiation: Secondary | ICD-10-CM | POA: Diagnosis not present

## 2020-01-18 DIAGNOSIS — L821 Other seborrheic keratosis: Secondary | ICD-10-CM | POA: Diagnosis not present

## 2020-01-18 DIAGNOSIS — D2230 Melanocytic nevi of unspecified part of face: Secondary | ICD-10-CM | POA: Diagnosis not present

## 2020-01-18 DIAGNOSIS — D485 Neoplasm of uncertain behavior of skin: Secondary | ICD-10-CM | POA: Diagnosis not present

## 2020-01-18 DIAGNOSIS — Z85828 Personal history of other malignant neoplasm of skin: Secondary | ICD-10-CM | POA: Diagnosis not present

## 2020-01-18 DIAGNOSIS — D225 Melanocytic nevi of trunk: Secondary | ICD-10-CM | POA: Diagnosis not present

## 2020-01-23 DIAGNOSIS — Z79899 Other long term (current) drug therapy: Secondary | ICD-10-CM | POA: Diagnosis not present

## 2020-01-23 DIAGNOSIS — D631 Anemia in chronic kidney disease: Secondary | ICD-10-CM | POA: Diagnosis not present

## 2020-01-23 DIAGNOSIS — R809 Proteinuria, unspecified: Secondary | ICD-10-CM | POA: Diagnosis not present

## 2020-01-23 DIAGNOSIS — N1832 Chronic kidney disease, stage 3b: Secondary | ICD-10-CM | POA: Diagnosis not present

## 2020-01-23 DIAGNOSIS — E559 Vitamin D deficiency, unspecified: Secondary | ICD-10-CM | POA: Diagnosis not present

## 2020-02-01 DIAGNOSIS — D472 Monoclonal gammopathy: Secondary | ICD-10-CM | POA: Diagnosis not present

## 2020-02-01 DIAGNOSIS — N1831 Chronic kidney disease, stage 3a: Secondary | ICD-10-CM | POA: Diagnosis not present

## 2020-02-01 DIAGNOSIS — E211 Secondary hyperparathyroidism, not elsewhere classified: Secondary | ICD-10-CM | POA: Diagnosis not present

## 2020-02-01 DIAGNOSIS — E559 Vitamin D deficiency, unspecified: Secondary | ICD-10-CM | POA: Diagnosis not present

## 2020-02-01 DIAGNOSIS — Z79899 Other long term (current) drug therapy: Secondary | ICD-10-CM | POA: Diagnosis not present

## 2020-04-09 DIAGNOSIS — J019 Acute sinusitis, unspecified: Secondary | ICD-10-CM | POA: Diagnosis not present

## 2020-04-09 DIAGNOSIS — J060 Acute laryngopharyngitis: Secondary | ICD-10-CM | POA: Diagnosis not present

## 2020-04-09 DIAGNOSIS — E785 Hyperlipidemia, unspecified: Secondary | ICD-10-CM | POA: Diagnosis not present

## 2020-04-09 DIAGNOSIS — E039 Hypothyroidism, unspecified: Secondary | ICD-10-CM | POA: Diagnosis not present

## 2020-04-09 DIAGNOSIS — B0223 Postherpetic polyneuropathy: Secondary | ICD-10-CM | POA: Diagnosis not present

## 2020-04-09 DIAGNOSIS — B0239 Other herpes zoster eye disease: Secondary | ICD-10-CM | POA: Diagnosis not present

## 2020-04-09 DIAGNOSIS — E782 Mixed hyperlipidemia: Secondary | ICD-10-CM | POA: Diagnosis not present

## 2020-04-09 DIAGNOSIS — I129 Hypertensive chronic kidney disease with stage 1 through stage 4 chronic kidney disease, or unspecified chronic kidney disease: Secondary | ICD-10-CM | POA: Diagnosis not present

## 2020-04-09 DIAGNOSIS — I10 Essential (primary) hypertension: Secondary | ICD-10-CM | POA: Diagnosis not present

## 2020-04-09 DIAGNOSIS — E875 Hyperkalemia: Secondary | ICD-10-CM | POA: Diagnosis not present

## 2020-04-09 DIAGNOSIS — E559 Vitamin D deficiency, unspecified: Secondary | ICD-10-CM | POA: Diagnosis not present

## 2020-04-15 DIAGNOSIS — J4530 Mild persistent asthma, uncomplicated: Secondary | ICD-10-CM | POA: Diagnosis not present

## 2020-04-15 DIAGNOSIS — E782 Mixed hyperlipidemia: Secondary | ICD-10-CM | POA: Diagnosis not present

## 2020-04-15 DIAGNOSIS — E875 Hyperkalemia: Secondary | ICD-10-CM | POA: Diagnosis not present

## 2020-04-15 DIAGNOSIS — Z23 Encounter for immunization: Secondary | ICD-10-CM | POA: Diagnosis not present

## 2020-04-15 DIAGNOSIS — R972 Elevated prostate specific antigen [PSA]: Secondary | ICD-10-CM | POA: Diagnosis not present

## 2020-04-15 DIAGNOSIS — I129 Hypertensive chronic kidney disease with stage 1 through stage 4 chronic kidney disease, or unspecified chronic kidney disease: Secondary | ICD-10-CM | POA: Diagnosis not present

## 2020-04-15 DIAGNOSIS — E039 Hypothyroidism, unspecified: Secondary | ICD-10-CM | POA: Diagnosis not present

## 2020-04-15 DIAGNOSIS — E559 Vitamin D deficiency, unspecified: Secondary | ICD-10-CM | POA: Diagnosis not present

## 2020-04-15 DIAGNOSIS — Z0001 Encounter for general adult medical examination with abnormal findings: Secondary | ICD-10-CM | POA: Diagnosis not present

## 2020-04-15 DIAGNOSIS — R7301 Impaired fasting glucose: Secondary | ICD-10-CM | POA: Diagnosis not present

## 2020-04-15 DIAGNOSIS — N401 Enlarged prostate with lower urinary tract symptoms: Secondary | ICD-10-CM | POA: Diagnosis not present

## 2020-05-28 DIAGNOSIS — E211 Secondary hyperparathyroidism, not elsewhere classified: Secondary | ICD-10-CM | POA: Diagnosis not present

## 2020-05-28 DIAGNOSIS — E559 Vitamin D deficiency, unspecified: Secondary | ICD-10-CM | POA: Diagnosis not present

## 2020-05-28 DIAGNOSIS — D509 Iron deficiency anemia, unspecified: Secondary | ICD-10-CM | POA: Diagnosis not present

## 2020-05-28 DIAGNOSIS — Z79899 Other long term (current) drug therapy: Secondary | ICD-10-CM | POA: Diagnosis not present

## 2020-05-28 DIAGNOSIS — N1831 Chronic kidney disease, stage 3a: Secondary | ICD-10-CM | POA: Diagnosis not present

## 2020-05-28 DIAGNOSIS — D472 Monoclonal gammopathy: Secondary | ICD-10-CM | POA: Diagnosis not present

## 2020-06-04 ENCOUNTER — Other Ambulatory Visit: Payer: Self-pay | Admitting: Nephrology

## 2020-06-04 ENCOUNTER — Other Ambulatory Visit (HOSPITAL_COMMUNITY): Payer: Self-pay | Admitting: Nephrology

## 2020-06-04 DIAGNOSIS — N1832 Chronic kidney disease, stage 3b: Secondary | ICD-10-CM

## 2020-06-04 DIAGNOSIS — E211 Secondary hyperparathyroidism, not elsewhere classified: Secondary | ICD-10-CM

## 2020-06-10 ENCOUNTER — Other Ambulatory Visit: Payer: Self-pay

## 2020-06-10 ENCOUNTER — Ambulatory Visit (HOSPITAL_COMMUNITY)
Admission: RE | Admit: 2020-06-10 | Discharge: 2020-06-10 | Disposition: A | Discharge status: Home / Self Care | Payer: Medicare Other | Source: Ambulatory Visit | Attending: Nephrology | Admitting: Nephrology

## 2020-06-10 DIAGNOSIS — N1832 Chronic kidney disease, stage 3b: Secondary | ICD-10-CM | POA: Diagnosis not present

## 2020-06-10 DIAGNOSIS — Q600 Renal agenesis, unilateral: Secondary | ICD-10-CM | POA: Diagnosis not present

## 2020-06-10 DIAGNOSIS — N281 Cyst of kidney, acquired: Secondary | ICD-10-CM | POA: Diagnosis not present

## 2020-06-10 DIAGNOSIS — N183 Chronic kidney disease, stage 3 unspecified: Secondary | ICD-10-CM | POA: Diagnosis not present

## 2020-06-10 DIAGNOSIS — E211 Secondary hyperparathyroidism, not elsewhere classified: Secondary | ICD-10-CM | POA: Diagnosis not present

## 2020-08-08 DIAGNOSIS — Z6831 Body mass index (BMI) 31.0-31.9, adult: Secondary | ICD-10-CM | POA: Diagnosis not present

## 2020-08-08 DIAGNOSIS — J45901 Unspecified asthma with (acute) exacerbation: Secondary | ICD-10-CM | POA: Diagnosis not present

## 2020-08-08 DIAGNOSIS — E785 Hyperlipidemia, unspecified: Secondary | ICD-10-CM | POA: Diagnosis not present

## 2020-08-08 DIAGNOSIS — I10 Essential (primary) hypertension: Secondary | ICD-10-CM | POA: Diagnosis not present

## 2020-08-08 DIAGNOSIS — R972 Elevated prostate specific antigen [PSA]: Secondary | ICD-10-CM | POA: Diagnosis not present

## 2020-08-08 DIAGNOSIS — J069 Acute upper respiratory infection, unspecified: Secondary | ICD-10-CM | POA: Diagnosis not present

## 2020-08-08 DIAGNOSIS — J019 Acute sinusitis, unspecified: Secondary | ICD-10-CM | POA: Diagnosis not present

## 2020-08-08 DIAGNOSIS — W57XXXD Bitten or stung by nonvenomous insect and other nonvenomous arthropods, subsequent encounter: Secondary | ICD-10-CM | POA: Diagnosis not present

## 2020-08-08 DIAGNOSIS — R7301 Impaired fasting glucose: Secondary | ICD-10-CM | POA: Diagnosis not present

## 2020-08-08 DIAGNOSIS — E559 Vitamin D deficiency, unspecified: Secondary | ICD-10-CM | POA: Diagnosis not present

## 2020-08-08 DIAGNOSIS — J060 Acute laryngopharyngitis: Secondary | ICD-10-CM | POA: Diagnosis not present

## 2020-08-22 DIAGNOSIS — L820 Inflamed seborrheic keratosis: Secondary | ICD-10-CM | POA: Diagnosis not present

## 2020-08-22 DIAGNOSIS — L570 Actinic keratosis: Secondary | ICD-10-CM | POA: Diagnosis not present

## 2020-08-22 DIAGNOSIS — D2230 Melanocytic nevi of unspecified part of face: Secondary | ICD-10-CM | POA: Diagnosis not present

## 2020-08-22 DIAGNOSIS — L700 Acne vulgaris: Secondary | ICD-10-CM | POA: Diagnosis not present

## 2020-08-22 DIAGNOSIS — L821 Other seborrheic keratosis: Secondary | ICD-10-CM | POA: Diagnosis not present

## 2020-08-22 DIAGNOSIS — D225 Melanocytic nevi of trunk: Secondary | ICD-10-CM | POA: Diagnosis not present

## 2020-08-22 DIAGNOSIS — B353 Tinea pedis: Secondary | ICD-10-CM | POA: Diagnosis not present

## 2020-08-22 DIAGNOSIS — Z86018 Personal history of other benign neoplasm: Secondary | ICD-10-CM | POA: Diagnosis not present

## 2020-08-22 DIAGNOSIS — L578 Other skin changes due to chronic exposure to nonionizing radiation: Secondary | ICD-10-CM | POA: Diagnosis not present

## 2020-08-22 DIAGNOSIS — Z85828 Personal history of other malignant neoplasm of skin: Secondary | ICD-10-CM | POA: Diagnosis not present

## 2020-08-28 DIAGNOSIS — Z23 Encounter for immunization: Secondary | ICD-10-CM | POA: Diagnosis not present

## 2020-09-25 DIAGNOSIS — I129 Hypertensive chronic kidney disease with stage 1 through stage 4 chronic kidney disease, or unspecified chronic kidney disease: Secondary | ICD-10-CM | POA: Diagnosis not present

## 2020-09-25 DIAGNOSIS — D472 Monoclonal gammopathy: Secondary | ICD-10-CM | POA: Diagnosis not present

## 2020-09-25 DIAGNOSIS — E211 Secondary hyperparathyroidism, not elsewhere classified: Secondary | ICD-10-CM | POA: Diagnosis not present

## 2020-09-25 DIAGNOSIS — E873 Alkalosis: Secondary | ICD-10-CM | POA: Diagnosis not present

## 2020-09-25 DIAGNOSIS — N1832 Chronic kidney disease, stage 3b: Secondary | ICD-10-CM | POA: Diagnosis not present

## 2020-10-08 DIAGNOSIS — I129 Hypertensive chronic kidney disease with stage 1 through stage 4 chronic kidney disease, or unspecified chronic kidney disease: Secondary | ICD-10-CM | POA: Diagnosis not present

## 2020-10-08 DIAGNOSIS — D472 Monoclonal gammopathy: Secondary | ICD-10-CM | POA: Diagnosis not present

## 2020-10-08 DIAGNOSIS — N281 Cyst of kidney, acquired: Secondary | ICD-10-CM | POA: Diagnosis not present

## 2020-10-08 DIAGNOSIS — N1832 Chronic kidney disease, stage 3b: Secondary | ICD-10-CM | POA: Diagnosis not present

## 2020-10-15 DIAGNOSIS — J45901 Unspecified asthma with (acute) exacerbation: Secondary | ICD-10-CM | POA: Diagnosis not present

## 2020-10-15 DIAGNOSIS — R7301 Impaired fasting glucose: Secondary | ICD-10-CM | POA: Diagnosis not present

## 2020-10-15 DIAGNOSIS — J060 Acute laryngopharyngitis: Secondary | ICD-10-CM | POA: Diagnosis not present

## 2020-10-15 DIAGNOSIS — J069 Acute upper respiratory infection, unspecified: Secondary | ICD-10-CM | POA: Diagnosis not present

## 2020-10-15 DIAGNOSIS — R972 Elevated prostate specific antigen [PSA]: Secondary | ICD-10-CM | POA: Diagnosis not present

## 2020-10-15 DIAGNOSIS — J019 Acute sinusitis, unspecified: Secondary | ICD-10-CM | POA: Diagnosis not present

## 2020-10-15 DIAGNOSIS — I10 Essential (primary) hypertension: Secondary | ICD-10-CM | POA: Diagnosis not present

## 2020-10-15 DIAGNOSIS — Z6831 Body mass index (BMI) 31.0-31.9, adult: Secondary | ICD-10-CM | POA: Diagnosis not present

## 2020-10-15 DIAGNOSIS — E785 Hyperlipidemia, unspecified: Secondary | ICD-10-CM | POA: Diagnosis not present

## 2020-10-15 DIAGNOSIS — E559 Vitamin D deficiency, unspecified: Secondary | ICD-10-CM | POA: Diagnosis not present

## 2020-10-15 DIAGNOSIS — W57XXXD Bitten or stung by nonvenomous insect and other nonvenomous arthropods, subsequent encounter: Secondary | ICD-10-CM | POA: Diagnosis not present

## 2020-10-20 DIAGNOSIS — E559 Vitamin D deficiency, unspecified: Secondary | ICD-10-CM | POA: Diagnosis not present

## 2020-10-20 DIAGNOSIS — E875 Hyperkalemia: Secondary | ICD-10-CM | POA: Diagnosis not present

## 2020-10-20 DIAGNOSIS — E039 Hypothyroidism, unspecified: Secondary | ICD-10-CM | POA: Diagnosis not present

## 2020-10-20 DIAGNOSIS — N401 Enlarged prostate with lower urinary tract symptoms: Secondary | ICD-10-CM | POA: Diagnosis not present

## 2020-10-20 DIAGNOSIS — R7301 Impaired fasting glucose: Secondary | ICD-10-CM | POA: Diagnosis not present

## 2020-10-20 DIAGNOSIS — J4530 Mild persistent asthma, uncomplicated: Secondary | ICD-10-CM | POA: Diagnosis not present

## 2020-10-20 DIAGNOSIS — R972 Elevated prostate specific antigen [PSA]: Secondary | ICD-10-CM | POA: Diagnosis not present

## 2020-10-20 DIAGNOSIS — I129 Hypertensive chronic kidney disease with stage 1 through stage 4 chronic kidney disease, or unspecified chronic kidney disease: Secondary | ICD-10-CM | POA: Diagnosis not present

## 2020-10-20 DIAGNOSIS — E782 Mixed hyperlipidemia: Secondary | ICD-10-CM | POA: Diagnosis not present

## 2020-10-21 DIAGNOSIS — D485 Neoplasm of uncertain behavior of skin: Secondary | ICD-10-CM | POA: Diagnosis not present

## 2020-10-21 DIAGNOSIS — D225 Melanocytic nevi of trunk: Secondary | ICD-10-CM | POA: Diagnosis not present

## 2020-10-21 DIAGNOSIS — L570 Actinic keratosis: Secondary | ICD-10-CM | POA: Diagnosis not present

## 2020-10-21 DIAGNOSIS — L820 Inflamed seborrheic keratosis: Secondary | ICD-10-CM | POA: Diagnosis not present

## 2020-10-28 DIAGNOSIS — Z23 Encounter for immunization: Secondary | ICD-10-CM | POA: Diagnosis not present

## 2020-11-20 DIAGNOSIS — J0100 Acute maxillary sinusitis, unspecified: Secondary | ICD-10-CM | POA: Diagnosis not present

## 2020-11-20 DIAGNOSIS — R051 Acute cough: Secondary | ICD-10-CM | POA: Diagnosis not present

## 2021-01-20 ENCOUNTER — Ambulatory Visit (INDEPENDENT_AMBULATORY_CARE_PROVIDER_SITE_OTHER): Payer: Medicare Other | Admitting: Gastroenterology

## 2021-01-20 ENCOUNTER — Other Ambulatory Visit: Payer: Self-pay

## 2021-01-20 ENCOUNTER — Encounter: Payer: Self-pay | Admitting: Gastroenterology

## 2021-01-20 DIAGNOSIS — R1310 Dysphagia, unspecified: Secondary | ICD-10-CM | POA: Insufficient documentation

## 2021-01-20 DIAGNOSIS — R1319 Other dysphagia: Secondary | ICD-10-CM | POA: Diagnosis not present

## 2021-01-20 NOTE — Progress Notes (Signed)
Primary Care Physician: Celene Squibb, MD  Primary Gastroenterologist:  Garfield Cornea, MD   Chief Complaint  Patient presents with  . Dysphagia    Very rare but does happen sometimes    HPI: [name] is a 80 y.o. male here for further evaluation difficulty swallowing.  Patient has had 4-5 episodes of near food impactions over the past 6 months.  First episode associated with vomiting/regurgitating food back up to get relief.  Typically occurs with steak, pork chops, rice, bread.  No problems swallowing pills.  Only occasionally has heartburn for which she takes Tagamet with relief.  Denies nausea.  Denies weight loss.  BMs regular.  No blood in stool or melena.  Patient has history of adenomatous colon polyps. Declined surveillance colonoscopy in 2019 due to age and desire to avoid bowel prep.   Current Outpatient Medications  Medication Sig Dispense Refill  . amLODipine (NORVASC) 5 MG tablet Take 5 mg by mouth daily.  5  . aspirin EC 81 MG tablet Take 81 mg by mouth daily.    . Cholecalciferol (VITAMIN D PO) Take 5,000 Units by mouth.    . Cyanocobalamin (VITAMIN B-12) 5000 MCG TBDP Take 1 tablet by mouth daily.    . Fluticasone-Salmeterol ([place]) Inhale into the lungs as needed.    Marland Kitchen guaiFENesin (MUCINEX) 600 MG 12 hr tablet Take 600 mg by mouth daily.    Marland Kitchen levalbuterol (XOPENEX) 1.25 MG/3ML nebulizer solution Inhale 1.25 mg into the lungs 3 (three) times daily as needed. Reported on 12/03/2015    . levothyroxine (SYNTHROID, LEVOTHROID) 75 MCG tablet Take 75 mcg by mouth at bedtime.    . simvastatin (ZOCOR) 40 MG tablet Take 40 mg by mouth at bedtime.    . tamsulosin (FLOMAX) 0.4 MG CAPS capsule Take 0.4 mg by mouth daily.    Marland Kitchen triamterene-hydrochlorothiazide (DYAZIDE) 37.5-25 MG capsule Take 1 capsule by mouth daily.  5  . zinc gluconate 50 MG tablet Take 50 mg by mouth daily.     No current facility-administered medications for this visit.    Allergies  as of 01/20/2021 - Review Complete 01/20/2021  Allergen Reaction Noted  . Meperidine and related Other (See Comments) 07/18/2012  . Sulfa antibiotics Rash 07/18/2012   Past Medical History:  Diagnosis Date  . Asthma   . Blood transfusion abn reaction or complication, no procedure mishap 1979   reaction due to wrong blood type given  . GERD (gastroesophageal reflux disease)    occasional  . H/O: pneumonia 02/27/2015  . Hyperlipidemia   . Hypertension   . Hypothyroidism   . MGUS (monoclonal gammopathy of unknown significance)   . Myocardial infarction (HCC)    hx of abnormal ekg showed prior myocardial infarction  . Renal disorder   . Vitamin B 12 deficiency 09/30/2016   Past Surgical History:  Procedure Laterality Date  . APPENDECTOMY  1963  . CHOLECYSTECTOMY  1979  . COLONOSCOPY  03/28/2008   KXF:GHWEXH rectum; left-sided transverse diverticula diminutive polyp; descending colon. Adenomas.  . COLONOSCOPY N/A 06/07/2013   Procedure: COLONOSCOPY;  Surgeon: Daneil Dolin, MD;  [facility];  Service: Endoscopy;  Laterality: N/A;  9:45  . HERNIA REPAIR  2005  . LUMBAR LAMINECTOMY/DECOMPRESSION MICRODISCECTOMY  07/26/2012   Procedure: LUMBAR LAMINECTOMY/DECOMPRESSION MICRODISCECTOMY;  Surgeon: Johnn Hai, MD;  Location: [facility];  Service: Orthopedics;  Laterality: N/A;  Lumbar Decompression L4-L5  . STOMACH SURGERY  2003  exploratory surgery, fatty tumors with obstruction?  . TONSILLECTOMY  1962    Family History  Problem Relation Age of Onset  . Thyroid disease Mother   . Heart disease Mother   . Hypertension Mother   . Clotting disorder Father   . Hypertension Father   . Cancer Sister   . Hypertension Sister   . Colon cancer Neg Hx    Social History   Tobacco Use  . Smoking status: Never Smoker  . Smokeless tobacco: Never Used  Vaping Use  . Vaping Use: Never used  Substance Use Topics  . Alcohol use: Yes    Comment: occasional  . Drug use: No     ROS:  General: Negative for anorexia, weight loss, fever, chills, fatigue, weakness. ENT: Negative for hoarseness,   nasal congestion. See hpi CV: Negative for chest pain, angina, palpitations, dyspnea on exertion, peripheral edema.  Respiratory: Negative for dyspnea at rest, dyspnea on exertion, cough, sputum, wheezing.  GI: See history of present illness. GU:  Negative for dysuria, hematuria, urinary incontinence, urinary frequency, nocturnal urination.  Endo: Negative for unusual weight change.    Physical Examination:   BP 133/74   Pulse 72   Temp (!) 96.8 F (36 C)   Ht 5\' 11"  (1.803 m)   Wt 211 lb (95.7 kg)   BMI 29.43 kg/m   General: Well-nourished, well-developed in no acute distress.  Eyes: No icterus. Mouth: Oropharyngeal mucosa moist and pink , no lesions erythema or exudate. Lungs: Clear to auscultation bilaterally.  Heart: Regular rate and rhythm, no murmurs rubs or gallops.  Abdomen: Bowel sounds are normal, nontender, nondistended, no hepatosplenomegaly or masses, no abdominal bruits or hernia , no rebound or guarding.   Extremities: No lower extremity edema. No clubbing or deformities. Neuro: Alert and oriented x 4   Skin: Warm and dry, no jaundice.   Psych: Alert and cooperative, normal mood and affect.  Labs:  Labs from September 25, 2020: White blood cell count 8700, hemoglobin 14.8, hematocrit 41.7, MCV 102.5, platelets 319,000, BUN 14, creatinine 1.52.   Imaging Studies: No results found.   Assessment/plan:  Pleasant 80 year old male presenting for further evaluation of solid food dysphagia with several episodes of food impactions.  Symptoms began about 6 months ago.  He has had 4-5 episodes of food becoming lodged and requiring food to come back up for relief.  Notes the need to use plenty of liquids during meals to get food getting on.  Has occasional reflux symptoms which responds to over-the-counter Tagamet.  Suspect esophageal stricture.   Offered EGD with esophageal dilation with Dr. Gala Romney in the near future.  Plan for conscious sedation. ASA II.  I have discussed the risks, alternatives, benefits with regards to but not limited to the risk of reaction to medication, bleeding, infection, perforation and the patient is agreeable to proceed. Written consent to be obtained.  In the meantime, he will avoid tough foods, raw vegetables, breads. Use plenty of liquids with meals. Chew food thoroughly. Finely chop meats.

## 2021-01-20 NOTE — Progress Notes (Signed)
Cc'ed to pcp °

## 2021-01-20 NOTE — Patient Instructions (Signed)
1. Upper endoscopy as scheduled.  Please see separate instructions. 2. Until your procedure, avoid raw vegetables/tough meats/bread/rice.  Use plenty of fluids and taking medication and eating.  Chew food thoroughly.  Finely chopped meats.

## 2021-01-27 ENCOUNTER — Telehealth: Payer: Self-pay | Admitting: Internal Medicine

## 2021-01-27 NOTE — Telephone Encounter (Signed)
Called pt. He is going to work around his schedule to have his EGD/ED done with Dr. Gala Romney. He has been scheduled for 4/6, pm appt. Aware will need covid test prior and would mail with his instructions. Confirmed mailing address.

## 2021-01-27 NOTE — Telephone Encounter (Signed)
Pt was calling to schedule his procedure with Dr Gala Romney. [phone] or [phone]

## 2021-01-29 DIAGNOSIS — D472 Monoclonal gammopathy: Secondary | ICD-10-CM | POA: Diagnosis not present

## 2021-01-29 DIAGNOSIS — I129 Hypertensive chronic kidney disease with stage 1 through stage 4 chronic kidney disease, or unspecified chronic kidney disease: Secondary | ICD-10-CM | POA: Diagnosis not present

## 2021-01-29 DIAGNOSIS — N281 Cyst of kidney, acquired: Secondary | ICD-10-CM | POA: Diagnosis not present

## 2021-01-29 DIAGNOSIS — N1832 Chronic kidney disease, stage 3b: Secondary | ICD-10-CM | POA: Diagnosis not present

## 2021-02-06 DIAGNOSIS — I129 Hypertensive chronic kidney disease with stage 1 through stage 4 chronic kidney disease, or unspecified chronic kidney disease: Secondary | ICD-10-CM | POA: Diagnosis not present

## 2021-02-06 DIAGNOSIS — E873 Alkalosis: Secondary | ICD-10-CM | POA: Diagnosis not present

## 2021-02-06 DIAGNOSIS — N1832 Chronic kidney disease, stage 3b: Secondary | ICD-10-CM | POA: Diagnosis not present

## 2021-02-06 DIAGNOSIS — R809 Proteinuria, unspecified: Secondary | ICD-10-CM | POA: Diagnosis not present

## 2021-02-18 ENCOUNTER — Other Ambulatory Visit: Payer: Self-pay

## 2021-02-18 ENCOUNTER — Ambulatory Visit (HOSPITAL_COMMUNITY)
Admission: RE | Admit: 2021-02-18 | Discharge: 2021-02-18 | Disposition: A | Discharge status: Home / Self Care | Payer: Medicare Other | Source: Ambulatory Visit | Attending: Physician Assistant | Admitting: Physician Assistant

## 2021-02-18 ENCOUNTER — Inpatient Hospital Stay (HOSPITAL_COMMUNITY): Payer: Medicare Other | Attending: Physician Assistant | Admitting: Physician Assistant

## 2021-02-18 ENCOUNTER — Other Ambulatory Visit (HOSPITAL_COMMUNITY): Payer: Medicare Other

## 2021-02-18 VITALS — BP 144/68 | HR 64 | Temp 97.3°F | Resp 18 | Wt 209.2 lb

## 2021-02-18 DIAGNOSIS — D72822 Plasmacytosis: Secondary | ICD-10-CM | POA: Diagnosis not present

## 2021-02-18 DIAGNOSIS — Z801 Family history of malignant neoplasm of trachea, bronchus and lung: Secondary | ICD-10-CM | POA: Insufficient documentation

## 2021-02-18 DIAGNOSIS — D472 Monoclonal gammopathy: Secondary | ICD-10-CM

## 2021-02-18 DIAGNOSIS — I129 Hypertensive chronic kidney disease with stage 1 through stage 4 chronic kidney disease, or unspecified chronic kidney disease: Secondary | ICD-10-CM | POA: Diagnosis not present

## 2021-02-18 DIAGNOSIS — N183 Chronic kidney disease, stage 3 unspecified: Secondary | ICD-10-CM | POA: Insufficient documentation

## 2021-02-18 DIAGNOSIS — D8989 Other specified disorders involving the immune mechanism, not elsewhere classified: Secondary | ICD-10-CM

## 2021-02-18 DIAGNOSIS — Z79899 Other long term (current) drug therapy: Secondary | ICD-10-CM | POA: Diagnosis not present

## 2021-02-18 NOTE — Progress Notes (Signed)
[place], [place] [address]   CLINIC:  Medical Oncology/Hematology  PCP:  Robert Squibb, MD [place] [address] [phone]   REASON FOR VISIT:  Follow-up for light-chain MGUS / plasmacytosis  CURRENT THERAPY: Observation  INTERVAL HISTORY:  [name] 80 y.o. male is here because of history of light-chain MGUS and plasmacytosis, recently noted to have elevated calcium by his nephrologist Dr. Theador [name].  He was previously seen at our clinic, discharged in 02-02-18 and told to follow-up as needed.  He was referred back to our clinic by Dr. Theador [name] due to elevated calcium.  Review of prior notes at the cancer center showed that the patient was initially seen here in November 2015 due to elevated kappa-lambda light chain ratio of 5.07, in the setting of stage III chronic kidney disease and hypertension.  Serial SPEP was negative, no M spike.  Serial free light chains show kappa light chains mildly elevated from 03-Feb-2015 to 2018-02-02, ranging from 21.8-27.6; lambda light chains within normal limits; free light chain ratio mildly elevated from 2014-02-02 12/2017, ranging from 1.66-4.0.  Serial skeletal surveys were within normal limits, no lytic lesions of the bones.  Bone marrow biopsy was performed 01/13/2016 with mild plasmacytosis evidenced by plasma cells at 7% with a polyclonal staining pattern.  Patient reports he has been doing well since his last visit.  No hospital stays, surgeries, or major changes to his medical history.  He denies any new bone pain or recent fractures. He denies any B symptoms.  No significant fatigue - leads a very active lifestyle. Denies any nausea, vomiting, or diarrhea. Denies any new pains.  No new neurologic symptoms such as new-onset hearing loss, blurred vision, headache, or dizziness.  Denies any numbness or tingling in hands or feet.  No thromboembolic events since his last visit. Has not noticed any recent  bleeding such as epistaxis, hematuria, hematemesis, melena, or hematochezia.  Denies recent chest pain on exertion, shortness of breath on minimal exertion, pre-syncopal episodes, or palpitations.  Denies any recent fevers, infections, or recent hospitalizations.  No new masses or lymphadenopathy per his report.  Patient reports appetite at100% and energy level at100%. He is maintaining stable weight at this time.  Review of recent labs (01/29/2021) from [facility] shows hemoglobin 14.4 with mild macrocytosis (MCV 103.5), creatinine 1.45 (baseline CKD 3), with noted elevated calcium 10.5.  His medical history is notable for HTN, CKD3, possible history of silent MI per EKG findings (stress test performed was negative).  [name] live at home with his wife, and leads a very active lifestyle.  He worked in the [employer], also served in [employer].  He is a lifelong nonsmoker.  Denies excessive alcohol intake and illicit drug use.  Mother deceased from MI in 02-Feb-1981 related to anaphylaxis.  Sister deceased due to lung cancer.  Brother deceased from MI related to drug use.  Father deceased in his late 57's from COPD.   REVIEW OF SYSTEMS:  Review of Systems  Constitutional: Negative for appetite change, chills, diaphoresis, fatigue, fever and unexpected weight change.  HENT:   Negative for lump/mass and nosebleeds.   Eyes: Negative for eye problems.  Respiratory: Negative for cough, hemoptysis and shortness of breath.   Cardiovascular: Negative for chest pain, leg swelling and palpitations.  Gastrointestinal: Negative for abdominal pain, blood in stool, constipation, diarrhea, nausea and vomiting.  Genitourinary: Negative for hematuria.  Skin: Negative.   Neurological: Negative for dizziness, headaches and light-headedness.  Hematological: Does not bruise/bleed easily.      PAST MEDICAL/SURGICAL HISTORY:  Past Medical History:  Diagnosis Date  . Asthma   . Blood  transfusion abn reaction or complication, no procedure mishap 1979   reaction due to wrong blood type given  . GERD (gastroesophageal reflux disease)    occasional  . H/O: pneumonia 02/27/2015  . Hyperlipidemia   . Hypertension   . Hypothyroidism   . MGUS (monoclonal gammopathy of unknown significance)   . Myocardial infarction (HCC)    hx of abnormal ekg showed prior myocardial infarction  . Renal disorder   . Vitamin B 12 deficiency 09/30/2016   Past Surgical History:  Procedure Laterality Date  . APPENDECTOMY  1963  . CHOLECYSTECTOMY  1979  . COLONOSCOPY  03/28/2008   [id number] rectum; left-sided transverse diverticula diminutive polyp; descending colon. Adenomas.  . COLONOSCOPY N/A 06/07/2013   Procedure: COLONOSCOPY;  Surgeon: Daneil Dolin, MD;  [facility];  Service: Endoscopy;  Laterality: N/A;  9:45  . HERNIA REPAIR  2005  . LUMBAR LAMINECTOMY/DECOMPRESSION MICRODISCECTOMY  07/26/2012   Procedure: LUMBAR LAMINECTOMY/DECOMPRESSION MICRODISCECTOMY;  Surgeon: Johnn Hai, MD;  Location: [facility];  Service: Orthopedics;  Laterality: N/A;  Lumbar Decompression L4-L5  . STOMACH SURGERY  2003   exploratory surgery, fatty tumors with obstruction?  . TONSILLECTOMY  1962     SOCIAL HISTORY:  Social History   Socioeconomic History  . Marital status: Married    Spouse name: Not on file  . Number of children: Not on file  . Years of education: Not on file  . Highest education level: Not on file  Occupational History  . Not on file  Tobacco Use  . Smoking status: Never Smoker  . Smokeless tobacco: Never Used  Vaping Use  . Vaping Use: Never used  Substance and Sexual Activity  . Alcohol use: Yes    Comment: occasional  . Drug use: No  . Sexual activity: Never  Other Topics Concern  . Not on file  Social History Narrative  . Not on file   Social Determinants of Health   Financial Resource Strain: Not on file  Food Insecurity: Not on file   Transportation Needs: Not on file  Physical Activity: Not on file  Stress: Not on file  Social Connections: Not on file  Intimate Partner Violence: Not on file    FAMILY HISTORY:  Family History  Problem Relation Age of Onset  . Thyroid disease Mother   . Heart disease Mother   . Hypertension Mother   . Clotting disorder Father   . Hypertension Father   . Cancer Sister   . Hypertension Sister   . Colon cancer Neg Hx     CURRENT MEDICATIONS:  Outpatient Encounter Medications as of 02/18/2021  Medication Sig  . amLODipine (NORVASC) 5 MG tablet Take 5 mg by mouth daily.  Marland Kitchen aspirin EC 81 MG tablet Take 81 mg by mouth daily.  . carboxymethylcellulose (REFRESH PLUS) 0.5 % SOLN Place 1 drop into both eyes 3 (three) times daily as needed (dry eyes).  . Cyanocobalamin (VITAMIN B-12) 5000 MCG TBDP Take 5,000 mcg by mouth daily.  . Fluticasone-Salmeterol (WIXELA INHUB IN) Inhale 1 puff into the lungs as needed (wheezing).  Marland Kitchen guaiFENesin (MUCINEX) 600 MG 12 hr tablet Take 600 mg by mouth every other day.  . levalbuterol (XOPENEX) 1.25 MG/3ML nebulizer solution Inhale 1.25 mg into  the lungs 3 (three) times daily as needed for wheezing.  Marland Kitchen levothyroxine (SYNTHROID, LEVOTHROID) 75 MCG tablet Take 75 mcg by mouth daily before breakfast.  . simvastatin (ZOCOR) 40 MG tablet Take 40 mg by mouth at bedtime.  . tamsulosin (FLOMAX) 0.4 MG CAPS capsule Take 0.4 mg by mouth daily.  Marland Kitchen zinc gluconate 50 MG tablet Take 50 mg by mouth daily.   No facility-administered encounter medications on file as of 02/18/2021.    ALLERGIES:  Allergies  Allergen Reactions  . Meperidine And Related Other (See Comments)    Makes him feel like he is having a heart attack  . Sulfa Antibiotics Rash     PHYSICAL EXAM:  ECOG PERFORMANCE STATUS: 0 - Asymptomatic  Vitals:   02/18/21 1259 02/18/21 1321  BP: (!) 172/92 (!) 144/68  Pulse: 64   Resp: 18   Temp: (!) 97.3 F (36.3 C)   SpO2: 96%    Filed Weights    02/18/21 1259  Weight: 209 lb 3.2 oz (94.9 kg)   Physical Exam Constitutional:      Appearance: Normal appearance. He is obese.  HENT:     Head: Normocephalic and atraumatic.     Mouth/Throat:     Mouth: Mucous membranes are moist.  Eyes:     Extraocular Movements: Extraocular movements intact.     Pupils: Pupils are equal, round, and reactive to light.  Cardiovascular:     Rate and Rhythm: Normal rate and regular rhythm.     Pulses: Normal pulses.     Heart sounds: Normal heart sounds.  Pulmonary:     Effort: Pulmonary effort is normal.     Breath sounds: Normal breath sounds.  Abdominal:     General: Bowel sounds are normal.     Palpations: Abdomen is soft.     Tenderness: There is no abdominal tenderness.  Musculoskeletal:        General: No swelling.     Right lower leg: No edema.     Left lower leg: No edema.  Lymphadenopathy:     Cervical: No cervical adenopathy.  Skin:    General: Skin is warm and dry.  Neurological:     General: No focal deficit present.     Mental Status: He is alert and oriented to person, place, and time.  Psychiatric:        Mood and Affect: Mood normal.        Behavior: Behavior normal.      LABORATORY DATA:  I have reviewed the labs as listed.  CBC    Component Value Date/Time   WBC 10.6 (H) 08/02/2018 0823   RBC 4.22 08/02/2018 0823   HGB 14.9 08/02/2018 0823   HCT 43.9 08/02/2018 0823   PLT 270 08/02/2018 0823   MCV 104.0 (H) 08/02/2018 0823   MCH 35.3 (H) 08/02/2018 0823   MCHC 33.9 08/02/2018 0823   RDW 13.0 08/02/2018 0823   LYMPHSABS 2.5 08/02/2018 0823   MONOABS 0.9 08/02/2018 0823   EOSABS 0.9 (H) 08/02/2018 0823   BASOSABS 0.1 08/02/2018 0823   CMP Latest Ref Rng & Units 08/02/2018 11/02/2017 08/09/2017  Glucose 70 - 99 mg/dL 124(H) 134(H) 100(H)  BUN 8 - 23 mg/dL $Remove'20 17 18  'CRRElbf$ Creatinine 0.61 - 1.24 mg/dL 1.67(H) 1.79(H) 1.64(H)  Sodium 135 - 145 mmol/L 142 139 138  Potassium 3.5 - 5.1 mmol/L 3.7 3.4(L) 3.7   Chloride 98 - 111 mmol/L 104 98(L) 96(L)  CO2 22 - 32 mmol/L 28  33(H) 32  Calcium 8.9 - 10.3 mg/dL 9.4 9.8 9.7  Total Protein 6.5 - 8.1 g/dL 7.6 - 8.2(H)  Total Bilirubin 0.3 - 1.2 mg/dL 0.6 - 0.7  Alkaline Phos 38 - 126 U/L 64 - 78  AST 15 - 41 U/L 28 - 27  ALT 0 - 44 U/L 20 - 25    DIAGNOSTIC IMAGING:  I have independently reviewed the relevant imaging and discussed with the patient.  ASSESSMENT: 1.  Kappa light chain MGUS with polyclonal plasmacytosis -Patient was initially seen here in November 2015 due to elevated kappa-lambda light chain ratio of 5.07, in the setting of stage III chronic kidney disease and hypertension. -Serial SPEP was negative, no M spike. -Serial free light chains show kappa light chains mildly elevated from 2015/02/06 to 02/05/18, ranging from 21.8-27.6; lambda light chains within normal limits; free light chain ratio mildly elevated from February 05, 2014 12/2017, ranging from 1.66-4.0. -Serial skeletal surveys were within normal limits, no lytic lesions of the bones. -Bone marrow biopsy was performed 01/13/2016 with mild plasmacytosis evidenced by plasma cells at 7% with a polyclonal staining pattern. -Discharged from cancer clinic in 02-05-18, referred back to [place] in 02/05/21 by Dr. Theador [name] due to elevated calcium (10.5) in the setting of the above history of kappa light chain MGUS  2. Social / Family History -Mr. Rauth live at home with his wife, and leads a very active lifestyle.  He worked in the [employer], also served in [employer].   -He is a lifelong nonsmoker.  Denies excessive alcohol intake and illicit drug use. -Mother deceased from MI in 05-Feb-1981 related to anaphylaxis.  Sister deceased due to lung cancer.  Brother deceased from MI related to drug use.  Father deceased in his late 4's from COPD   PLAN:  1.  Kappa light chain MGUS with polyclonal plasmacytosis -Review of recent labs (01/29/2021) from [facility] shows hemoglobin 14.4 with mild macrocytosis  (MCV 103.5), creatinine 1.45 (baseline CKD 3), with noted elevated calcium 10.5. -Work-up for plasma cell disorder (MGUS versus multiple myeloma) with 24-hour urine with UPEP/IFE, SPEP, serum IFE, CBC, CMP, LDH, beta-2 microglobulin -Skeletal survey -We will consider repeat bone marrow biopsy depending on the above results -Return to clinic in 2 weeks to discuss results and next steps; will contnue to follow at least annually due to risk of future progression   PLAN SUMMARY & DISPOSITION: - Labs and skeletal survey today - RTC in 2 weeks All questions were answered. The patient knows to call the clinic with any problems, questions or concerns.  Medical decision making: Moderate (1 chronic illness with exacerbation, review of external nephrology note, review of prior tests, ordering new tests)  Time spent on visit: I spent 25 minutes counseling the patient face to face. The total time spent in the appointment was 30 minutes and more than 50% was on counseling.  Harriett Rush, PA-C  02/18/21 2:14 PM

## 2021-02-18 NOTE — Patient Instructions (Signed)
[place] at [facility] Discharge Instructions  You were seen today by Tarri Abernethy PA-C for your abnormal protein levels (MGUS).  We will recheck several labs to check the status of your MGUS.    LABS:  Labs today before leaving the hospital   OTHER TESTS: Skeletal survey before leaving the hospital today  MEDICATIONS: No changes  FOLLOW-UP APPOINTMENT: Appointment with Tarri Abernethy, PA-C in 2 weeks to discuss results.  Thank you for choosing [place] at [facility] to provide your oncology and hematology care.  To afford each patient quality time with our provider, please arrive at least 15 minutes before your scheduled appointment time.   If you have a lab appointment with the [facility] please come in thru the Main Entrance and check in at the main information desk.  You need to re-schedule your appointment should you arrive 10 or more minutes late.  We strive to give you quality time with our providers, and arriving late affects you and other patients whose appointments are after yours.  Also, if you no show three or more times for appointments you may be dismissed from the clinic at the providers discretion.     Again, thank you for choosing [facility].  Our hope is that these requests will decrease the amount of time that you wait before being seen by our physicians.       _____________________________________________________________  Should you have questions after your visit to [facility], please contact our office at [phone] and follow the prompts.  Our office hours are 8:00 a.m. and 4:30 p.m. Monday - Friday.  Please note that voicemails left after 4:00 p.m. may not be returned until the following business day.  We are closed weekends and major holidays.  You do have access to a nurse 24-7, just call the main number to the clinic [phone] and do not press any options, hold on the  line and a nurse will answer the phone.    For prescription refill requests, have your pharmacy contact our office and allow 72 hours.    Due to Covid, you will need to wear a mask upon entering the hospital. If you do not have a mask, a mask will be given to you at the Main Entrance upon arrival. For doctor visits, patients may have 1 support person age 39 or older with them. For treatment visits, patients can not have anyone with them due to social distancing guidelines and our immunocompromised population.

## 2021-02-20 DIAGNOSIS — E873 Alkalosis: Secondary | ICD-10-CM | POA: Diagnosis not present

## 2021-02-20 DIAGNOSIS — R809 Proteinuria, unspecified: Secondary | ICD-10-CM | POA: Diagnosis not present

## 2021-02-20 DIAGNOSIS — N1832 Chronic kidney disease, stage 3b: Secondary | ICD-10-CM | POA: Diagnosis not present

## 2021-02-20 DIAGNOSIS — I129 Hypertensive chronic kidney disease with stage 1 through stage 4 chronic kidney disease, or unspecified chronic kidney disease: Secondary | ICD-10-CM | POA: Diagnosis not present

## 2021-02-23 ENCOUNTER — Other Ambulatory Visit (HOSPITAL_COMMUNITY)
Admission: RE | Admit: 2021-02-23 | Discharge: 2021-02-23 | Disposition: A | Discharge status: Home / Self Care | Payer: Medicare Other | Source: Ambulatory Visit | Attending: Internal Medicine | Admitting: Internal Medicine

## 2021-02-23 ENCOUNTER — Other Ambulatory Visit: Payer: Self-pay

## 2021-02-23 DIAGNOSIS — Z20822 Contact with and (suspected) exposure to covid-19: Secondary | ICD-10-CM | POA: Insufficient documentation

## 2021-02-23 DIAGNOSIS — Z01812 Encounter for preprocedural laboratory examination: Secondary | ICD-10-CM | POA: Insufficient documentation

## 2021-02-24 LAB — SARS CORONAVIRUS 2 (TAT 6-24 HRS): SARS Coronavirus 2: NEGATIVE

## 2021-02-25 ENCOUNTER — Encounter (HOSPITAL_COMMUNITY): Payer: Self-pay | Admitting: Internal Medicine

## 2021-02-25 ENCOUNTER — Other Ambulatory Visit: Payer: Self-pay

## 2021-02-25 ENCOUNTER — Encounter (HOSPITAL_COMMUNITY)
Admission: RE | Disposition: A | Discharge status: Home / Self Care | Payer: Self-pay | Source: Home / Self Care | Attending: Internal Medicine

## 2021-02-25 ENCOUNTER — Ambulatory Visit (HOSPITAL_COMMUNITY)
Admission: RE | Admit: 2021-02-25 | Discharge: 2021-02-25 | Disposition: A | Discharge status: Home / Self Care | Payer: Medicare Other | Attending: Internal Medicine | Admitting: Internal Medicine

## 2021-02-25 DIAGNOSIS — K449 Diaphragmatic hernia without obstruction or gangrene: Secondary | ICD-10-CM | POA: Diagnosis not present

## 2021-02-25 DIAGNOSIS — Z888 Allergy status to other drugs, medicaments and biological substances status: Secondary | ICD-10-CM | POA: Diagnosis not present

## 2021-02-25 DIAGNOSIS — Z79899 Other long term (current) drug therapy: Secondary | ICD-10-CM | POA: Insufficient documentation

## 2021-02-25 DIAGNOSIS — K2100 Gastro-esophageal reflux disease with esophagitis, without bleeding: Secondary | ICD-10-CM | POA: Insufficient documentation

## 2021-02-25 DIAGNOSIS — Z7982 Long term (current) use of aspirin: Secondary | ICD-10-CM | POA: Insufficient documentation

## 2021-02-25 DIAGNOSIS — R1310 Dysphagia, unspecified: Secondary | ICD-10-CM | POA: Diagnosis not present

## 2021-02-25 DIAGNOSIS — K222 Esophageal obstruction: Secondary | ICD-10-CM | POA: Diagnosis not present

## 2021-02-25 DIAGNOSIS — Z882 Allergy status to sulfonamides status: Secondary | ICD-10-CM | POA: Insufficient documentation

## 2021-02-25 DIAGNOSIS — K3189 Other diseases of stomach and duodenum: Secondary | ICD-10-CM | POA: Diagnosis not present

## 2021-02-25 DIAGNOSIS — Z809 Family history of malignant neoplasm, unspecified: Secondary | ICD-10-CM | POA: Diagnosis not present

## 2021-02-25 DIAGNOSIS — Z8349 Family history of other endocrine, nutritional and metabolic diseases: Secondary | ICD-10-CM | POA: Insufficient documentation

## 2021-02-25 DIAGNOSIS — Z832 Family history of diseases of the blood and blood-forming organs and certain disorders involving the immune mechanism: Secondary | ICD-10-CM | POA: Diagnosis not present

## 2021-02-25 DIAGNOSIS — Z8249 Family history of ischemic heart disease and other diseases of the circulatory system: Secondary | ICD-10-CM | POA: Diagnosis not present

## 2021-02-25 DIAGNOSIS — R1314 Dysphagia, pharyngoesophageal phase: Secondary | ICD-10-CM | POA: Insufficient documentation

## 2021-02-25 HISTORY — PX: MALONEY DILATION: SHX5535

## 2021-02-25 HISTORY — PX: ESOPHAGOGASTRODUODENOSCOPY: SHX5428

## 2021-02-25 SURGERY — EGD (ESOPHAGOGASTRODUODENOSCOPY)
Anesthesia: Moderate Sedation

## 2021-02-25 MED ORDER — MIDAZOLAM HCL 5 MG/5ML IJ SOLN
INTRAMUSCULAR | Status: AC
  Filled 2021-02-25: qty 10

## 2021-02-25 MED ORDER — ONDANSETRON HCL 4 MG/2ML IJ SOLN
INTRAMUSCULAR | Status: DC | PRN
  Administered 2021-02-25: 4 mg via INTRAVENOUS

## 2021-02-25 MED ORDER — STERILE WATER FOR IRRIGATION IR SOLN
Status: DC | PRN
  Administered 2021-02-25: 200 mL

## 2021-02-25 MED ORDER — FENTANYL CITRATE (PF) 100 MCG/2ML IJ SOLN
INTRAMUSCULAR | Status: DC | PRN
  Administered 2021-02-25: 25 ug via INTRAVENOUS
  Administered 2021-02-25: 15 ug via INTRAVENOUS

## 2021-02-25 MED ORDER — LIDOCAINE VISCOUS HCL 2 % MT SOLN
OROMUCOSAL | Status: AC
  Filled 2021-02-25: qty 15

## 2021-02-25 MED ORDER — FENTANYL CITRATE (PF) 100 MCG/2ML IJ SOLN
INTRAMUSCULAR | Status: AC
  Filled 2021-02-25: qty 2

## 2021-02-25 MED ORDER — SODIUM CHLORIDE 0.9 % IV SOLN: INTRAVENOUS | Status: DC

## 2021-02-25 MED ORDER — ONDANSETRON HCL 4 MG/2ML IJ SOLN
INTRAMUSCULAR | Status: AC
  Filled 2021-02-25: qty 2

## 2021-02-25 MED ORDER — MIDAZOLAM HCL 5 MG/5ML IJ SOLN
INTRAMUSCULAR | Status: DC | PRN
  Administered 2021-02-25 (×2): 1 mg via INTRAVENOUS
  Administered 2021-02-25: 2 mg via INTRAVENOUS
  Administered 2021-02-25: 1 mg via INTRAVENOUS

## 2021-02-25 MED ORDER — LIDOCAINE VISCOUS HCL 2 % MT SOLN
OROMUCOSAL | Status: DC | PRN
  Administered 2021-02-25: 1 via OROMUCOSAL

## 2021-02-25 NOTE — H&P (Signed)
@LOGO @   Primary Care Physician:  Celene Squibb, MD Primary Gastroenterologist:  Dr. Nevada Crane  Pre-Procedure History & Physical: HPI:  [name] is a 80 y.o. male here for further evaluation of esophageal dysphagia.  Multiple episodes of transient food impaction as he describes.  Denies significant reflux symptoms.  On no acid suppression therapy. Past Medical History:  Diagnosis Date  . Asthma   . Blood transfusion abn reaction or complication, no procedure mishap 1979   reaction due to wrong blood type given  . GERD (gastroesophageal reflux disease)    occasional  . H/O: pneumonia 02/27/2015  . Hyperlipidemia   . Hypertension   . Hypothyroidism   . MGUS (monoclonal gammopathy of unknown significance)   . Myocardial infarction (HCC)    hx of abnormal ekg showed prior myocardial infarction  . Renal disorder   . Vitamin B 12 deficiency 09/30/2016    Past Surgical History:  Procedure Laterality Date  . APPENDECTOMY  1963  . CHOLECYSTECTOMY  1979  . COLONOSCOPY  03/28/2008   [id number] rectum; left-sided transverse diverticula diminutive polyp; descending colon. Adenomas.  . COLONOSCOPY N/A 06/07/2013   Procedure: COLONOSCOPY;  Surgeon: Daneil Dolin, MD;  [facility];  Service: Endoscopy;  Laterality: N/A;  9:45  . HERNIA REPAIR  2005  . LUMBAR LAMINECTOMY/DECOMPRESSION MICRODISCECTOMY  07/26/2012   Procedure: LUMBAR LAMINECTOMY/DECOMPRESSION MICRODISCECTOMY;  Surgeon: Johnn Hai, MD;  Location: [facility];  Service: Orthopedics;  Laterality: N/A;  Lumbar Decompression L4-L5  . STOMACH SURGERY  2003   exploratory surgery, fatty tumors with obstruction?  . TONSILLECTOMY  1962    Prior to Admission medications   Medication Sig Start Date End Date Taking? Authorizing Provider  amLODipine (NORVASC) 5 MG tablet Take 5 mg by mouth daily. 04/22/17  Yes [provider]  aspirin EC 81 MG tablet Take 81 mg by mouth daily.   Yes [provider]   carboxymethylcellulose (REFRESH PLUS) 0.5 % SOLN Place 1 drop into both eyes 3 (three) times daily as needed (dry eyes).   Yes [provider]  Cyanocobalamin (VITAMIN B-12) 5000 MCG TBDP Take 5,000 mcg by mouth daily.   Yes [provider]  Fluticasone-Salmeterol (WIXELA INHUB IN) Inhale 1 puff into the lungs as needed (wheezing).   Yes [provider]  guaiFENesin (MUCINEX) 600 MG 12 hr tablet Take 600 mg by mouth every other day.   Yes [provider]  levothyroxine (SYNTHROID, LEVOTHROID) 75 MCG tablet Take 75 mcg by mouth daily before breakfast.   Yes [provider]  simvastatin (ZOCOR) 40 MG tablet Take 40 mg by mouth at bedtime.   Yes [provider]  tamsulosin (FLOMAX) 0.4 MG CAPS capsule Take 0.4 mg by mouth daily.   Yes [provider]  zinc gluconate 50 MG tablet Take 50 mg by mouth daily.   Yes [provider]  levalbuterol (XOPENEX) 1.25 MG/3ML nebulizer solution Inhale 1.25 mg into the lungs 3 (three) times daily as needed for wheezing. 04/24/13   [provider]    Allergies as of 01/27/2021 - Review Complete 01/20/2021  Allergen Reaction Noted  . Meperidine and related Other (See Comments) 07/18/2012  . Sulfa antibiotics Rash 07/18/2012    Family History  Problem Relation Age of Onset  . Thyroid disease Mother   . Heart disease Mother   . Hypertension Mother   . Clotting disorder Father   . Hypertension Father   . Cancer Sister   .  Hypertension Sister   . Colon cancer Neg Hx     Social History   Socioeconomic History  . Marital status: Married    Spouse name: Not on file  . Number of children: Not on file  . Years of education: Not on file  . Highest education level: Not on file  Occupational History  . Not on file  Tobacco Use  . Smoking status: Never Smoker  . Smokeless tobacco: Never Used  Vaping Use  . Vaping Use: Never used  Substance and Sexual Activity  . Alcohol use:  Yes    Comment: occasional  . Drug use: No  . Sexual activity: Never  Other Topics Concern  . Not on file  Social History Narrative  . Not on file   Social Determinants of Health   Financial Resource Strain: Not on file  Food Insecurity: Not on file  Transportation Needs: Not on file  Physical Activity: Not on file  Stress: Not on file  Social Connections: Not on file  Intimate Partner Violence: Not on file    Review of Systems: See HPI, otherwise negative ROS  Physical Exam: BP (!) 160/75   Temp 97.8 F (36.6 C) (Oral)   Resp 14   Ht 5\' 11"  (1.803 m)   Wt 94.3 kg   SpO2 96%   BMI 29.01 kg/m  General:   Alert,  Well-developed, well-nourished, pleasant and cooperative in NAD Neck:  Supple; no masses or thyromegaly. No significant cervical adenopathy. Lungs:  Clear throughout to auscultation.   No wheezes, crackles, or rhonchi. No acute distress. Heart:  Regular rate and rhythm; no murmurs, clicks, rubs,  or gallops. Abdomen: Non-distended, normal bowel sounds.  Soft and nontender without appreciable mass or hepatosplenomegaly.  Pulses:  Normal pulses noted. Extremities:  Without clubbing or edema.  Impression/Plan: 80 year old gentleman here for further evaluation of intermittent esophageal dysphagia.  I have offered the patient an EGD with possible esophageal dilation.  The risks, benefits, limitations, alternatives and imponderables have been reviewed with the patient. Potential for esophageal dilation, biopsy, etc. have also been reviewed.  Questions have been answered. All parties agreeable.     Notice: This dictation was prepared with Dragon dictation along with smaller phrase technology. Any transcriptional errors that result from this process are unintentional and may not be corrected upon review.

## 2021-02-25 NOTE — Discharge Instructions (Signed)
EGD Discharge instructions Please read the instructions outlined below and refer to this sheet in the next few weeks. These discharge instructions provide you with general information on caring for yourself after you leave the hospital. Your doctor may also give you specific instructions. While your treatment has been planned according to the most current medical practices available, unavoidable complications occasionally occur. If you have any problems or questions after discharge, please call your doctor. ACTIVITY  You may resume your regular activity but move at a slower pace for the next 24 hours.   Take frequent rest periods for the next 24 hours.   Walking will help expel (get rid of) the air and reduce the bloated feeling in your abdomen.   No driving for 24 hours (because of the anesthesia (medicine) used during the test).   You may shower.   Do not sign any important legal documents or operate any machinery for 24 hours (because of the anesthesia used during the test).  NUTRITION  Drink plenty of fluids.   You may resume your normal diet.   Begin with a light meal and progress to your normal diet.   Avoid alcoholic beverages for 24 hours or as instructed by your caregiver.  MEDICATIONS  You may resume your normal medications unless your caregiver tells you otherwise.  WHAT YOU CAN EXPECT TODAY  You may experience abdominal discomfort such as a feeling of fullness or "gas" pains.  FOLLOW-UP  Your doctor will discuss the results of your test with you.  SEEK IMMEDIATE MEDICAL ATTENTION IF ANY OF THE FOLLOWING OCCUR:  Excessive nausea (feeling sick to your stomach) and/or vomiting.   Severe abdominal pain and distention (swelling).   Trouble swallowing.   Temperature over 101 F (37.8 C).   Rectal bleeding or vomiting of blood.     You had a reflux related stricture in your esophagus.  Your esophagus was dilated.  You are having acid reflux even though you are  not feeling it.  Begin Protonix 40 mg daily before breakfast each morning  Can use Chloraseptic spray for any transient sore throat you may experience  You also had what appeared to be to fatty deposits in your stomach which may be fatty tumors called lipomas.  This finding needs further evaluation with a CT scan of the abdomen (CT of the abdomen and pelvis with contrast)  Blood pressure running a little high today.  Would simply have that rechecked later in the week.  Office visit with [place] in 6 weeks    At patient request, I called Derald Macleod at [phone] -reviewed findings and recommendations

## 2021-02-25 NOTE — Op Note (Signed)
[facility] Patient Name: [name] Procedure Date: 02/25/2021 9:11 AM MRN: [id number] Date of Birth: 11-15-1941 Attending MD: Norvel Richards , MD CSN: [id number] Age: 80 Admit Type: Outpatient Procedure:                Upper GI endoscopy Indications:              Dysphagia Providers:                Norvel Richards, MD, Crystal Page, Shelly                            Risa Grill, Technician Referring MD:              Medicines:                Midazolam 5 mg IV, Meperidine 40 mg IV Complications:            No immediate complications. Estimated Blood Loss:     Estimated blood loss was minimal. Procedure:                Pre-Anesthesia Assessment:                           - Prior to the procedure, a History and Physical                            was performed, and patient medications and                            allergies were reviewed. The patient's tolerance of                            previous anesthesia was also reviewed. The risks                            and benefits of the procedure and the sedation                            options and risks were discussed with the patient.                            All questions were answered, and informed consent                            was obtained. Prior Anticoagulants: The patient has                            taken no previous anticoagulant or antiplatelet                            agents. ASA Grade Assessment: II - A patient with                            mild systemic disease. After reviewing the risks  and benefits, the patient was deemed in                            satisfactory condition to undergo the procedure.                           After obtaining informed consent, the endoscope was                            passed under direct vision. Throughout the                            procedure, the patient's blood pressure, pulse, and                            oxygen  saturations were monitored continuously. The                            [id number]) was introduced through the mouth,                            and advanced to the second part of duodenum. The                            upper GI endoscopy was accomplished without                            difficulty. The patient tolerated the procedure                            well. Scope In: 10:06:27 AM Scope Out: 10:15:21 AM Total Procedure Duration: 0 hours 8 minutes 54 seconds  Findings:      Soft noncritical appearing peptic stricture at the GE junction with       overlying distal esophageal erosions. No nodularity. No tumor. No       Barrett's epithelium seen. Scope easily traverses segment.      A small hiatal hernia was present. (2) slightly yellowish soft       submucosal nodules just lateral and anterior to the angularis. -Largest       approximately 2.5 cm in dimensions -likely lipomas. Patent pylorus.      The duodenal bulb and second portion of the duodenum were normal. Scope       was removed, a 54 [place] Maloney dilator was passed to full insertion       with mild resistance. A look back revealed the stricture had been nicely       dilated with no apparent complication. Minimal blood loss. Impression:               -Soft, noncritical. Peptic stricture with                            associated erosive reflux esophagitis?"status post                            Maloney dilation Small hiatal hernia.                           -(  2) submucosal gastric nodules. Likely lipomas.                           Normal duodenal bulb and second portion of the                            duodenum.                           - No specimens collected. Moderate Sedation:      Moderate (conscious) sedation was administered by the endoscopy nurse       and supervised by the endoscopist. The following parameters were       monitored: oxygen saturation, heart rate, blood pressure, respiratory       rate,  EKG, adequacy of pulmonary ventilation, and response to care.       Total physician intraservice time was 16 minutes. Recommendation:           - Patient has a contact number available for                            emergencies. The signs and symptoms of potential                            delayed complications were discussed with the                            patient. Return to normal activities tomorrow.                            Written discharge instructions were provided to the                            patient.                           - Advance diet as tolerated.                           - Continue present medications. Begin Protonix 40                            mg daily. CT of the abdomen pelvis to further                            evaluate gastric nodules. Office visit with [place] in 6                            weeks. Procedure Code(s):        --- Professional ---                           [phone], Esophagogastroduodenoscopy, flexible,                            transoral; diagnostic, including collection of  specimen(s) by brushing or washing, when performed                            (separate procedure)                           G0500, Moderate sedation services provided by the                            same physician or other qualified health care                            professional performing a gastrointestinal                            endoscopic service that sedation supports,                            requiring the presence of an independent trained                            observer to assist in the monitoring of the                            patient's level of consciousness and physiological                            status; initial 15 minutes of intra-service time;                            patient age 68 years or older (additional time may                            be reported with [phone], as appropriate) Diagnosis Code(s):         --- Professional ---                           K44.9, Diaphragmatic hernia without obstruction or                            gangrene                           R13.10, Dysphagia, unspecified CPT copyright 2019 American Medical Association. All rights reserved. The codes documented in this report are preliminary and upon coder review may  be revised to meet current compliance requirements. [name]. Robert Arrellano, MD Norvel Richards, MD 02/25/2021 10:35:08 AM This report has been signed electronically. Number of Addenda: 0

## 2021-02-26 ENCOUNTER — Other Ambulatory Visit (HOSPITAL_COMMUNITY): Payer: Self-pay

## 2021-02-26 DIAGNOSIS — D8989 Other specified disorders involving the immune mechanism, not elsewhere classified: Secondary | ICD-10-CM

## 2021-02-26 DIAGNOSIS — D472 Monoclonal gammopathy: Secondary | ICD-10-CM

## 2021-03-02 ENCOUNTER — Encounter (HOSPITAL_COMMUNITY): Payer: Self-pay | Admitting: Internal Medicine

## 2021-03-02 LAB — UPEP/UIFE/LIGHT CHAINS/TP, 24-HR UR
% BETA, Urine: 10.8 %
ALPHA 1 URINE: 5.1 %
Albumin, U: 72.2 %
Alpha 2, Urine: 4.2 %
Free Kappa Lt Chains,Ur: 24.39 mg/L (ref 1.17–86.46)
Free Kappa/Lambda Ratio: 7.97 (ref 1.83–14.26)
Free Lambda Lt Chains,Ur: 3.06 mg/L (ref 0.27–15.21)
GAMMA GLOBULIN URINE: 7.6 %
Total Protein, Urine-Ur/day: 289 mg/24 hr — ABNORMAL HIGH (ref 30–150)
Total Protein, Urine: 14.1 mg/dL
Total Volume: 2050

## 2021-03-05 ENCOUNTER — Other Ambulatory Visit: Payer: Self-pay

## 2021-03-05 ENCOUNTER — Ambulatory Visit (HOSPITAL_COMMUNITY): Payer: Medicare Other | Admitting: Physician Assistant

## 2021-03-05 ENCOUNTER — Inpatient Hospital Stay (HOSPITAL_COMMUNITY): Payer: Medicare Other | Attending: Hematology

## 2021-03-05 DIAGNOSIS — D472 Monoclonal gammopathy: Secondary | ICD-10-CM | POA: Diagnosis not present

## 2021-03-05 DIAGNOSIS — D8989 Other specified disorders involving the immune mechanism, not elsewhere classified: Secondary | ICD-10-CM

## 2021-03-05 LAB — COMPREHENSIVE METABOLIC PANEL
ALT: 19 U/L (ref 0–44)
AST: 27 U/L (ref 15–41)
Albumin: 4.2 g/dL (ref 3.5–5.0)
Alkaline Phosphatase: 71 U/L (ref 38–126)
Anion gap: 11 (ref 5–15)
BUN: 16 mg/dL (ref 8–23)
CO2: 27 mmol/L (ref 22–32)
Calcium: 9.7 mg/dL (ref 8.9–10.3)
Chloride: 102 mmol/L (ref 98–111)
Creatinine, Ser: 1.42 mg/dL — ABNORMAL HIGH (ref 0.61–1.24)
GFR, Estimated: 51 mL/min — ABNORMAL LOW (ref >60)
Glucose, Bld: 132 mg/dL — ABNORMAL HIGH (ref 70–99)
Potassium: 3.3 mmol/L — ABNORMAL LOW (ref 3.5–5.1)
Sodium: 140 mmol/L (ref 135–145)
Total Bilirubin: 0.6 mg/dL (ref 0.3–1.2)
Total Protein: 7.5 g/dL (ref 6.5–8.1)

## 2021-03-05 LAB — CBC WITH DIFFERENTIAL/PLATELET
Abs Immature Granulocytes: 0.01 10*3/uL (ref 0.00–0.07)
Basophils Absolute: 0.1 10*3/uL (ref 0.0–0.1)
Basophils Relative: 1 %
Eosinophils Absolute: 1.4 10*3/uL — ABNORMAL HIGH (ref 0.0–0.5)
Eosinophils Relative: 17 %
HCT: 42.1 % (ref 39.0–52.0)
Hemoglobin: 14.1 g/dL (ref 13.0–17.0)
Immature Granulocytes: 0 %
Lymphocytes Relative: 28 %
Lymphs Abs: 2.2 10*3/uL (ref 0.7–4.0)
MCH: 35 pg — ABNORMAL HIGH (ref 26.0–34.0)
MCHC: 33.5 g/dL (ref 30.0–36.0)
MCV: 104.5 fL — ABNORMAL HIGH (ref 80.0–100.0)
Monocytes Absolute: 0.6 10*3/uL (ref 0.1–1.0)
Monocytes Relative: 7 %
Neutro Abs: 3.8 10*3/uL (ref 1.7–7.7)
Neutrophils Relative %: 47 %
Platelets: 326 10*3/uL (ref 150–400)
RBC: 4.03 MIL/uL — ABNORMAL LOW (ref 4.22–5.81)
RDW: 12.7 % (ref 11.5–15.5)
WBC: 8 10*3/uL (ref 4.0–10.5)
nRBC: 0 % (ref 0.0–0.2)

## 2021-03-05 LAB — LACTATE DEHYDROGENASE: LDH: 134 U/L (ref 98–192)

## 2021-03-05 LAB — VITAMIN D 25 HYDROXY (VIT D DEFICIENCY, FRACTURES): Vit D, 25-Hydroxy: 41.04 ng/mL (ref 30–100)

## 2021-03-06 LAB — PROTEIN ELECTROPHORESIS, SERUM
A/G Ratio: 1.3 (ref 0.7–1.7)
Albumin ELP: 3.9 g/dL (ref 2.9–4.4)
Alpha-1-Globulin: 0.3 g/dL (ref 0.0–0.4)
Alpha-2-Globulin: 0.9 g/dL (ref 0.4–1.0)
Beta Globulin: 1 g/dL (ref 0.7–1.3)
Gamma Globulin: 0.9 g/dL (ref 0.4–1.8)
Globulin, Total: 3.1 g/dL (ref 2.2–3.9)
Total Protein ELP: 7 g/dL (ref 6.0–8.5)

## 2021-03-06 LAB — PTH, INTACT AND CALCIUM
Calcium, Total (PTH): 9.9 mg/dL (ref 8.6–10.2)
PTH: 23 pg/mL (ref 15–65)

## 2021-03-06 LAB — BETA 2 MICROGLOBULIN, SERUM: Beta-2 Microglobulin: 2.4 mg/L (ref 0.6–2.4)

## 2021-03-09 DIAGNOSIS — Z85828 Personal history of other malignant neoplasm of skin: Secondary | ICD-10-CM | POA: Diagnosis not present

## 2021-03-09 DIAGNOSIS — Z86018 Personal history of other benign neoplasm: Secondary | ICD-10-CM | POA: Diagnosis not present

## 2021-03-09 DIAGNOSIS — D2230 Melanocytic nevi of unspecified part of face: Secondary | ICD-10-CM | POA: Diagnosis not present

## 2021-03-09 DIAGNOSIS — L578 Other skin changes due to chronic exposure to nonionizing radiation: Secondary | ICD-10-CM | POA: Diagnosis not present

## 2021-03-09 DIAGNOSIS — D225 Melanocytic nevi of trunk: Secondary | ICD-10-CM | POA: Diagnosis not present

## 2021-03-09 DIAGNOSIS — L821 Other seborrheic keratosis: Secondary | ICD-10-CM | POA: Diagnosis not present

## 2021-03-09 DIAGNOSIS — L570 Actinic keratosis: Secondary | ICD-10-CM | POA: Diagnosis not present

## 2021-03-09 LAB — IMMUNOFIXATION ELECTROPHORESIS
IgA: 159 mg/dL (ref 61–437)
IgG (Immunoglobin G), Serum: 1079 mg/dL (ref 603–1613)
IgM (Immunoglobulin M), Srm: 34 mg/dL (ref 15–143)
Total Protein ELP: 7.2 g/dL (ref 6.0–8.5)

## 2021-03-12 NOTE — Progress Notes (Signed)
[facility] [address], [place] [address]   CLINIC:  Medical Oncology/Hematology  PCP:  Robert Stabile, MD [address] [name] [name] [address] [phone]   REASON FOR VISIT:  Follow-up for light chain MGUS  CURRENT THERAPY: Observation  INTERVAL HISTORY:  [name] 80 y.o. male returns for routine follow-up of his MGUS.  He was last seen in our clinic on 02/18/2021 after being referred back to [place] by Dr. Wolfgang [name].  He was previously seen at our clinic, discharged in 2019 and told to follow-up as needed. He was referred back to our clinic by Dr. Wolfgang [name] due to elevated calcium.  At his last appointment, patient had MGUS/myeloma testing ordered.  He returns today to discuss these results.  Patient reportshehas been doing well since hislast visit. No hospital stays, surgeries, or major changes to his medical history.  He has been doing lots of yard work at his house this week, and reports that he feels tired after the day's work.  He denies abnormal fatigue.  Energy is 70%, appetite 100%.  He is maintaining a stable weight and denies unintentional weight loss.  Hedenies any new bone pain or recent fractures. Hedenies any B symptoms. No significant fatigue - leads a very active lifestyle. Denies any nausea, vomiting, or diarrhea. Denies any new pains.  No new neurologic symptoms such as new-onset hearing loss, blurred vision, headache, or dizziness. Denies any numbness or tingling in hands or feet.  No thromboembolic events sincehislast visit. Has not noticed any recent bleeding such as epistaxis, hematuria, hematemesis, melena, or hematochezia.  Denies recent chest pain on exertion, shortness of breath on minimal exertion, pre-syncopal episodes, or palpitations.  Denies any recent fevers, infections, or recent hospitalizations.  No new masses or lymphadenopathy perhisreport.   REVIEW OF SYSTEMS:  Review of Systems  Constitutional: Negative  for appetite change, chills, diaphoresis, fatigue, fever and unexpected weight change.  HENT:   Negative for lump/mass and nosebleeds.   Eyes: Negative for eye problems.  Respiratory: Negative for cough, hemoptysis and shortness of breath.   Cardiovascular: Negative for chest pain, leg swelling and palpitations.  Gastrointestinal: Negative for abdominal pain, blood in stool, constipation, diarrhea, nausea and vomiting.  Genitourinary: Negative for hematuria.   Skin: Negative.   Neurological: Negative for dizziness, headaches and light-headedness.  Hematological: Does not bruise/bleed easily.      PAST MEDICAL/SURGICAL HISTORY:  Past Medical History:  Diagnosis Date  . Asthma   . Blood transfusion abn reaction or complication, no procedure mishap 1979   reaction due to wrong blood type given  . GERD (gastroesophageal reflux disease)    occasional  . H/O: pneumonia 02/27/2015  . Hyperlipidemia   . Hypertension   . Hypothyroidism   . MGUS (monoclonal gammopathy of unknown significance)   . Myocardial infarction (HCC)    hx of abnormal ekg showed prior myocardial infarction  . Renal disorder   . Vitamin B 12 deficiency 09/30/2016   Past Surgical History:  Procedure Laterality Date  . APPENDECTOMY  1963  . CHOLECYSTECTOMY  1979  . COLONOSCOPY  03/28/2008   XYP:GDOMTT rectum; left-sided transverse diverticula diminutive polyp; descending colon. Adenomas.  . COLONOSCOPY N/A 06/07/2013   Procedure: COLONOSCOPY;  Surgeon: Corbin Ade, MD;  Location: [facility];  Service: Endoscopy;  Laterality: N/A;  9:45  . ESOPHAGOGASTRODUODENOSCOPY N/A 02/25/2021   Procedure: ESOPHAGOGASTRODUODENOSCOPY (EGD);  Surgeon: Corbin Ade, MD;  Location: [facility];  Service: Endoscopy;  Laterality: N/A;  pm appt  . HERNIA REPAIR  2005  . LUMBAR LAMINECTOMY/DECOMPRESSION MICRODISCECTOMY  07/26/2012   Procedure: LUMBAR LAMINECTOMY/DECOMPRESSION MICRODISCECTOMY;  Surgeon: Johnn Hai, MD;   Location: [facility];  Service: Orthopedics;  Laterality: N/A;  Lumbar Decompression L4-L5  . MALONEY DILATION N/A 02/25/2021   Procedure: Venia Minks DILATION;  Surgeon: Daneil Dolin, MD;  Location: [facility];  Service: Endoscopy;  Laterality: N/A;  . STOMACH SURGERY  2003   exploratory surgery, fatty tumors with obstruction?  . TONSILLECTOMY  1962     SOCIAL HISTORY:  Social History   Socioeconomic History  . Marital status: Married    Spouse name: Not on file  . Number of children: Not on file  . Years of education: Not on file  . Highest education level: Not on file  Occupational History  . Not on file  Tobacco Use  . Smoking status: Never Smoker  . Smokeless tobacco: Never Used  Vaping Use  . Vaping Use: Never used  Substance and Sexual Activity  . Alcohol use: Yes    Comment: occasional  . Drug use: No  . Sexual activity: Never  Other Topics Concern  . Not on file  Social History Narrative  . Not on file   Social Determinants of Health   Financial Resource Strain: Not on file  Food Insecurity: Not on file  Transportation Needs: Not on file  Physical Activity: Not on file  Stress: Not on file  Social Connections: Not on file  Intimate Partner Violence: Not on file    FAMILY HISTORY:  Family History  Problem Relation Age of Onset  . Thyroid disease Mother   . Heart disease Mother   . Hypertension Mother   . Clotting disorder Father   . Hypertension Father   . Cancer Sister   . Hypertension Sister   . Colon cancer Neg Hx     CURRENT MEDICATIONS:  Outpatient Encounter Medications as of 03/13/2021  Medication Sig  . amLODipine (NORVASC) 5 MG tablet Take 5 mg by mouth daily.  Marland Kitchen aspirin EC 81 MG tablet Take 81 mg by mouth daily.  . carboxymethylcellulose (REFRESH PLUS) 0.5 % SOLN Place 1 drop into both eyes 3 (three) times daily as needed (dry eyes).  . Cyanocobalamin (VITAMIN B-12) 5000 MCG TBDP Take 5,000 mcg by mouth daily.  . Fluticasone-Salmeterol  (WIXELA INHUB IN) Inhale 1 puff into the lungs as needed (wheezing).  Marland Kitchen guaiFENesin (MUCINEX) 600 MG 12 hr tablet Take 600 mg by mouth every other day.  . levalbuterol (XOPENEX) 1.25 MG/3ML nebulizer solution Inhale 1.25 mg into the lungs 3 (three) times daily as needed for wheezing.  Marland Kitchen levothyroxine (SYNTHROID, LEVOTHROID) 75 MCG tablet Take 75 mcg by mouth daily before breakfast.  . simvastatin (ZOCOR) 40 MG tablet Take 40 mg by mouth at bedtime.  . tamsulosin (FLOMAX) 0.4 MG CAPS capsule Take 0.4 mg by mouth daily.  Marland Kitchen zinc gluconate 50 MG tablet Take 50 mg by mouth daily.   No facility-administered encounter medications on file as of 03/13/2021.    ALLERGIES:  Allergies  Allergen Reactions  . Meperidine And Related Other (See Comments)    Makes him feel like he is having a heart attack  . Sulfa Antibiotics Rash     PHYSICAL EXAM:  ECOG PERFORMANCE STATUS: 0 - Asymptomatic  There were no vitals filed for this visit. There were no vitals filed for this visit. Physical Exam Constitutional:      Appearance:  Normal appearance.  HENT:     Head: Normocephalic and atraumatic.     Mouth/Throat:     Mouth: Mucous membranes are moist.  Eyes:     Extraocular Movements: Extraocular movements intact.     Pupils: Pupils are equal, round, and reactive to light.  Cardiovascular:     Rate and Rhythm: Normal rate and regular rhythm.     Pulses: Normal pulses.     Heart sounds: Normal heart sounds.  Pulmonary:     Effort: Pulmonary effort is normal.     Breath sounds: Normal breath sounds.  Abdominal:     General: Bowel sounds are normal.     Palpations: Abdomen is soft.     Tenderness: There is no abdominal tenderness.  Musculoskeletal:        General: No swelling.     Right lower leg: No edema.     Left lower leg: No edema.  Lymphadenopathy:     Cervical: No cervical adenopathy.  Skin:    General: Skin is warm and dry.  Neurological:     General: No focal deficit present.      Mental Status: He is alert and oriented to person, place, and time.  Psychiatric:        Mood and Affect: Mood normal.        Behavior: Behavior normal.      LABORATORY DATA:  I have reviewed the labs as listed.  CBC    Component Value Date/Time   WBC 8.0 03/05/2021 1052   RBC 4.03 (L) 03/05/2021 1052   HGB 14.1 03/05/2021 1052   HCT 42.1 03/05/2021 1052   PLT 326 03/05/2021 1052   MCV 104.5 (H) 03/05/2021 1052   MCH 35.0 (H) 03/05/2021 1052   MCHC 33.5 03/05/2021 1052   RDW 12.7 03/05/2021 1052   LYMPHSABS 2.2 03/05/2021 1052   MONOABS 0.6 03/05/2021 1052   EOSABS 1.4 (H) 03/05/2021 1052   BASOSABS 0.1 03/05/2021 1052   CMP Latest Ref Rng & Units 03/05/2021 03/05/2021 08/02/2018  Glucose 70 - 99 mg/dL 132(H) - 124(H)  BUN 8 - 23 mg/dL 16 - 20  Creatinine 0.61 - 1.24 mg/dL 1.42(H) - 1.67(H)  Sodium 135 - 145 mmol/L 140 - 142  Potassium 3.5 - 5.1 mmol/L 3.3(L) - 3.7  Chloride 98 - 111 mmol/L 102 - 104  CO2 22 - 32 mmol/L 27 - 28  Calcium 8.6 - 10.2 mg/dL 9.7 9.9 9.4  Total Protein 6.5 - 8.1 g/dL 7.5 - 7.6  Total Bilirubin 0.3 - 1.2 mg/dL 0.6 - 0.6  Alkaline Phos 38 - 126 U/L 71 - 64  AST 15 - 41 U/L 27 - 28  ALT 0 - 44 U/L 19 - 20    DIAGNOSTIC IMAGING:  I have independently reviewed the relevant imaging and discussed with the patient.  ASSESSMENT: 1. 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  Serial SPEP was negative, no M spike.  Serial skeletal surveys were within normal limits, no lytic lesions of the bones. -Bone marrow biopsy was performed 01/13/2016 with mild plasmacytosis evidenced by plasma cells at 7% with a polyclonal staining pattern. -Discharged from cancer clinic in 2019, referred back to [place] in March 2022 by Dr. Theador Hawthorne due to elevated calcium (10.5) in the setting of the  above history of kappa light chain MGUS - Repeat MGUS/myeloma  panel obtained in March/April 2022  No M spike detected on SPEP  Immunofixation pattern unremarkable, no evidence of monoclonal protein on IFE  No monoclonal protein or M spike in urine IFE/UPEP  Hgb 14.1, calcium 9.7, serum creatinine stable at baseline 1.42  LDH normal at 134, beta-2 microglobulin normal at 2.4  Skeletal survey negative for definitive lytic lesions  2. Social / Family History -[name] live at home with his wife, and leads a very active lifestyle. He worked in [employer], also served in [employer].  -He is a lifelong nonsmoker. Denies excessive alcohol intake and illicit drug use. -Mother deceased [place] in 14 related to anaphylaxis. Sister deceased due to lung cancer. Brother deceased from MI related to drug use. Father deceased in his late 65's from COPD   PLAN:  1.Kappa light chain MGUS with polyclonal plasmacytosis -Review of MGUS panel unremarkable - no current signs of plasma cell dyscrasia -Due to history of previously diagnosed MGUS and polyclonal plasmacytosis, will have patient repeat panel and return for follow-up office visit in 1 year   PLAN SUMMARY & DISPOSITION: - Labs in 1 year, skeletal survey in 1 year - RTC in 1 year after labs/x-ray results available  All questions were answered. The patient knows to call the clinic with any problems, questions or concerns.   Medical decision making: Low  Time spent on visit: I spent 15 minutes counseling the patient face to face. The total time spent in the appointment was 20 minutes and more than 50% was on counseling.   Harriett Rush, PA-C  03/12/21 4:29 PM

## 2021-03-13 ENCOUNTER — Other Ambulatory Visit: Payer: Self-pay

## 2021-03-13 ENCOUNTER — Inpatient Hospital Stay (HOSPITAL_BASED_OUTPATIENT_CLINIC_OR_DEPARTMENT_OTHER): Payer: Medicare Other | Admitting: Physician Assistant

## 2021-03-13 VITALS — BP 142/74 | HR 73 | Temp 96.8°F | Resp 16 | Wt 210.3 lb

## 2021-03-13 DIAGNOSIS — D472 Monoclonal gammopathy: Secondary | ICD-10-CM | POA: Diagnosis not present

## 2021-03-13 NOTE — Patient Instructions (Signed)
[place] at [facility] Discharge Instructions  You were seen today by Tarri Abernethy PA-C for your history of abnormal protein (MGUS).  The labs we checked did not show any sign of abnormal protein.  We will check again in 1 year and see you for a follow-up visit at that time.    Thank you for choosing [place] at [facility] to provide your oncology and hematology care.  To afford each patient quality time with our provider, please arrive at least 15 minutes before your scheduled appointment time.   If you have a lab appointment with the [facility] please come in thru the Main Entrance and check in at the main information desk.  You need to re-schedule your appointment should you arrive 10 or more minutes late.  We strive to give you quality time with our providers, and arriving late affects you and other patients whose appointments are after yours.  Also, if you no show three or more times for appointments you may be dismissed from the clinic at the providers discretion.     Again, thank you for choosing [facility].  Our hope is that these requests will decrease the amount of time that you wait before being seen by our physicians.       _____________________________________________________________  Should you have questions after your visit to [facility], please contact our office at [phone] and follow the prompts.  Our office hours are 8:00 a.m. and 4:30 p.m. Monday - Friday.  Please note that voicemails left after 4:00 p.m. may not be returned until the following business day.  We are closed weekends and major holidays.  You do have access to a nurse 24-7, just call the main number to the clinic [phone] and do not press any options, hold on the line and a nurse will answer the phone.    For prescription refill requests, have your pharmacy contact our office and allow 72 hours.    Due to Covid, you  will need to wear a mask upon entering the hospital. If you do not have a mask, a mask will be given to you at the Main Entrance upon arrival. For doctor visits, patients may have 1 support person age 39 or older with them. For treatment visits, patients can not have anyone with them due to social distancing guidelines and our immunocompromised population.

## 2021-04-01 DIAGNOSIS — M9903 Segmental and somatic dysfunction of lumbar region: Secondary | ICD-10-CM | POA: Diagnosis not present

## 2021-04-01 DIAGNOSIS — M9902 Segmental and somatic dysfunction of thoracic region: Secondary | ICD-10-CM | POA: Diagnosis not present

## 2021-04-01 DIAGNOSIS — M62830 Muscle spasm of back: Secondary | ICD-10-CM | POA: Diagnosis not present

## 2021-04-01 DIAGNOSIS — M9905 Segmental and somatic dysfunction of pelvic region: Secondary | ICD-10-CM | POA: Diagnosis not present

## 2021-04-03 DIAGNOSIS — M9903 Segmental and somatic dysfunction of lumbar region: Secondary | ICD-10-CM | POA: Diagnosis not present

## 2021-04-03 DIAGNOSIS — N1832 Chronic kidney disease, stage 3b: Secondary | ICD-10-CM | POA: Diagnosis not present

## 2021-04-03 DIAGNOSIS — R809 Proteinuria, unspecified: Secondary | ICD-10-CM | POA: Diagnosis not present

## 2021-04-03 DIAGNOSIS — M9905 Segmental and somatic dysfunction of pelvic region: Secondary | ICD-10-CM | POA: Diagnosis not present

## 2021-04-03 DIAGNOSIS — I129 Hypertensive chronic kidney disease with stage 1 through stage 4 chronic kidney disease, or unspecified chronic kidney disease: Secondary | ICD-10-CM | POA: Diagnosis not present

## 2021-04-03 DIAGNOSIS — E876 Hypokalemia: Secondary | ICD-10-CM | POA: Diagnosis not present

## 2021-04-03 DIAGNOSIS — M9902 Segmental and somatic dysfunction of thoracic region: Secondary | ICD-10-CM | POA: Diagnosis not present

## 2021-04-03 DIAGNOSIS — M62830 Muscle spasm of back: Secondary | ICD-10-CM | POA: Diagnosis not present

## 2021-04-13 DIAGNOSIS — N1832 Chronic kidney disease, stage 3b: Secondary | ICD-10-CM | POA: Diagnosis not present

## 2021-04-13 DIAGNOSIS — R809 Proteinuria, unspecified: Secondary | ICD-10-CM | POA: Diagnosis not present

## 2021-04-13 DIAGNOSIS — E876 Hypokalemia: Secondary | ICD-10-CM | POA: Diagnosis not present

## 2021-04-13 DIAGNOSIS — I129 Hypertensive chronic kidney disease with stage 1 through stage 4 chronic kidney disease, or unspecified chronic kidney disease: Secondary | ICD-10-CM | POA: Diagnosis not present

## 2021-04-14 ENCOUNTER — Other Ambulatory Visit: Payer: Self-pay

## 2021-04-14 ENCOUNTER — Ambulatory Visit (INDEPENDENT_AMBULATORY_CARE_PROVIDER_SITE_OTHER): Payer: Medicare Other | Admitting: Internal Medicine

## 2021-04-14 ENCOUNTER — Encounter: Payer: Self-pay | Admitting: Internal Medicine

## 2021-04-14 VITALS — BP 130/82 | HR 79 | Temp 96.9°F | Ht 71.0 in | Wt 206.2 lb

## 2021-04-14 DIAGNOSIS — R1319 Other dysphagia: Secondary | ICD-10-CM | POA: Diagnosis not present

## 2021-04-14 DIAGNOSIS — Z86010 Personal history of colonic polyps: Secondary | ICD-10-CM | POA: Diagnosis not present

## 2021-04-14 NOTE — Progress Notes (Signed)
Primary Care Physician:  Celene Squibb, MD Primary Gastroenterologist:  Dr. Gala Romney  Pre-Procedure History & Physical: HPI:  [name] is a 80 y.o. male here for follow-up from recent EGD done for esophageal dysphagia.  Benign-appearing peptic stricture found.  He was dilated.  Dysphagia has resolved.  He was prescribed Protonix but has not started taking it as of yet.  He denies heartburn and currently denies any dysphagia.  He did have 2 benign-appearing submucosal lesions felt to be lipomas in his stomach on EGD.  Distant history of going undergoing a bowel resection for what sounds like lipomas about 20 years ago.  He is doing very well clinically at this time.  He is extremely active working in the yard, plays basketball and golf  -  very active in retirement.  Retired Emergency planning/management officer.  History of colonic adenomas removed over time last colonoscopy 2014.  Small adenomas removed.  Was recommended he have 1 more exam in 5 years.  That did not happen.  He is having no lower GI tract symptoms at this time.  We discussed the pros and cons of 1 more surveillance colonoscopy  Past Medical History:  Diagnosis Date  . Asthma   . Blood transfusion abn reaction or complication, no procedure mishap 1979   reaction due to wrong blood type given  . GERD (gastroesophageal reflux disease)    occasional  . H/O: pneumonia 02/27/2015  . Hyperlipidemia   . Hypertension   . Hypothyroidism   . MGUS (monoclonal gammopathy of unknown significance)   . Myocardial infarction (HCC)    hx of abnormal ekg showed prior myocardial infarction  . Renal disorder   . Vitamin B 12 deficiency 09/30/2016    Past Surgical History:  Procedure Laterality Date  . APPENDECTOMY  1963  . CHOLECYSTECTOMY  1979  . COLONOSCOPY  03/28/2008   WUJ:WJXBJY rectum; left-sided transverse diverticula diminutive polyp; descending colon. Adenomas.  . COLONOSCOPY N/A 06/07/2013   Procedure: COLONOSCOPY;  Surgeon: Daneil Dolin, MD;   Location: [facility];  Service: Endoscopy;  Laterality: N/A;  9:45  . ESOPHAGOGASTRODUODENOSCOPY N/A 02/25/2021   Procedure: ESOPHAGOGASTRODUODENOSCOPY (EGD);  Surgeon: Daneil Dolin, MD;  Location: [facility];  Service: Endoscopy;  Laterality: N/A;  pm appt  . HERNIA REPAIR  2005  . LUMBAR LAMINECTOMY/DECOMPRESSION MICRODISCECTOMY  07/26/2012   Procedure: LUMBAR LAMINECTOMY/DECOMPRESSION MICRODISCECTOMY;  Surgeon: Johnn Hai, MD;  Location: [facility];  Service: Orthopedics;  Laterality: N/A;  Lumbar Decompression L4-L5  . MALONEY DILATION N/A 02/25/2021   Procedure: Venia Minks DILATION;  Surgeon: Daneil Dolin, MD;  Location: [facility];  Service: Endoscopy;  Laterality: N/A;  . STOMACH SURGERY  2003   exploratory surgery, fatty tumors with obstruction?  . TONSILLECTOMY  1962    Prior to Admission medications   Medication Sig Start Date End Date Taking? Authorizing Provider  amLODipine (NORVASC) 5 MG tablet Take 5 mg by mouth daily. 04/22/17  Yes [provider]  aspirin EC 81 MG tablet Take 81 mg by mouth daily.   Yes [provider]  carboxymethylcellulose (REFRESH PLUS) 0.5 % SOLN Place 1 drop into both eyes 3 (three) times daily as needed (dry eyes).   Yes [provider]  Cyanocobalamin (VITAMIN B-12) 5000 MCG TBDP Take 5,000 mcg by mouth daily.   Yes [provider]  Fluticasone-Salmeterol (WIXELA INHUB IN) Inhale 1 puff into the lungs as needed (wheezing).   Yes [provider]  guaiFENesin (  MUCINEX) 600 MG 12 hr tablet Take 600 mg by mouth every other day.   Yes [provider]  levalbuterol (XOPENEX) 1.25 MG/3ML nebulizer solution Inhale 1.25 mg into the lungs 3 (three) times daily as needed for wheezing. 04/24/13  Yes [provider]  levothyroxine (SYNTHROID, LEVOTHROID) 75 MCG tablet Take 75 mcg by mouth daily before breakfast.   Yes [provider]  pantoprazole (PROTONIX) 40 MG tablet Take 1 tablet by  mouth daily. 02/26/21  Yes [provider]  simvastatin (ZOCOR) 40 MG tablet Take 40 mg by mouth at bedtime.   Yes [provider]  tamsulosin (FLOMAX) 0.4 MG CAPS capsule Take 0.4 mg by mouth daily.   Yes [provider]  triamterene-hydrochlorothiazide (DYAZIDE) 37.5-25 MG capsule Take 1 capsule by mouth daily.   Yes [provider]  zinc gluconate 50 MG tablet Take 50 mg by mouth daily.   Yes [provider]    Allergies as of 04/14/2021 - Review Complete 04/14/2021  Allergen Reaction Noted  . Meperidine and related Other (See Comments) 07/18/2012  . Sulfa antibiotics Rash 07/18/2012    Family History  Problem Relation Age of Onset  . Thyroid disease Mother   . Heart disease Mother   . Hypertension Mother   . Clotting disorder Father   . Hypertension Father   . Cancer Sister   . Hypertension Sister   . Colon cancer Neg Hx     Social History   Socioeconomic History  . Marital status: Married    Spouse name: Not on file  . Number of children: Not on file  . Years of education: Not on file  . Highest education level: Not on file  Occupational History  . Not on file  Tobacco Use  . Smoking status: Never Smoker  . Smokeless tobacco: Never Used  Vaping Use  . Vaping Use: Never used  Substance and Sexual Activity  . Alcohol use: Yes    Comment: occasional  . Drug use: No  . Sexual activity: Never  Other Topics Concern  . Not on file  Social History Narrative  . Not on file   Social Determinants of Health   Financial Resource Strain: Low Risk   . Difficulty of Paying Living Expenses: Not hard at all  Food Insecurity: No Food Insecurity  . Worried About Charity fundraiser in the Last Year: Never true  . Ran Out of Food in the Last Year: Never true  Transportation Needs: No Transportation Needs  . Lack of Transportation (Medical): No  . Lack of Transportation (Non-Medical): No  Physical Activity: Sufficiently Active  .  Days of Exercise per Week: 7 days  . Minutes of Exercise per Session: 60 min  Stress: No Stress Concern Present  . Feeling of Stress : Not at all  Social Connections: Moderately Isolated  . Frequency of Communication with Friends and Family: More than three times a week  . Frequency of Social Gatherings with Friends and Family: Once a week  . Attends Religious Services: Never  . Active Member of Clubs or Organizations: No  . Attends Archivist Meetings: Never  . Marital Status: Married  Human resources officer Violence: Not At Risk  . Fear of Current or Ex-Partner: No  . Emotionally Abused: No  . Physically Abused: No  . 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  It was a peptic stricture;  I discussed the need to treat for reflux whether he perceives symptoms or not.  He may or may not need a repeat EGD and dilation down the line. Talked about the pros and cons of 1 more surveillance colonoscopy. He has a very good quality of life and remains active. We mutually agreed that 1 more colonoscopy was in order.  Recommendations:  As discussed, you will do better if you take the Protonix or pantoprazole 40 mg every day-before breakfast.  We will get a CT scan (CTE of the abdomen and pelvis) to evaluate areas of fat accumulation in your stomach as discussed  We will proceed with 1 more surveillance colonoscopy later this summer (history of colonic polyps) /ASA 3/propofol  When I get the CT scan results back, I will be in touch.      Notice: This dictation was prepared with Dragon dictation along with  smaller phrase technology. Any transcriptional errors that result from this process are unintentional and may not be corrected upon review.

## 2021-04-14 NOTE — Patient Instructions (Signed)
As discussed, you will do better if you take the Protonix or pantoprazole 40 mg every day-before breakfast.  We will get a CT scan (CTE of the abdomen and pelvis) to evaluate areas of fat accumulation in your stomach as discussed  We will proceed with 1 more surveillance colonoscopy later this summer (history of colonic polyps) /ASA 3/propofol  When I get the CT scan results back, I will be in touch.  It was good to see you again today.

## 2021-04-15 DIAGNOSIS — M9903 Segmental and somatic dysfunction of lumbar region: Secondary | ICD-10-CM | POA: Diagnosis not present

## 2021-04-15 DIAGNOSIS — M62830 Muscle spasm of back: Secondary | ICD-10-CM | POA: Diagnosis not present

## 2021-04-15 DIAGNOSIS — M9905 Segmental and somatic dysfunction of pelvic region: Secondary | ICD-10-CM | POA: Diagnosis not present

## 2021-04-15 DIAGNOSIS — M9902 Segmental and somatic dysfunction of thoracic region: Secondary | ICD-10-CM | POA: Diagnosis not present

## 2021-04-17 ENCOUNTER — Telehealth: Payer: Self-pay

## 2021-04-17 DIAGNOSIS — I129 Hypertensive chronic kidney disease with stage 1 through stage 4 chronic kidney disease, or unspecified chronic kidney disease: Secondary | ICD-10-CM | POA: Diagnosis not present

## 2021-04-17 DIAGNOSIS — E782 Mixed hyperlipidemia: Secondary | ICD-10-CM | POA: Diagnosis not present

## 2021-04-17 NOTE — Telephone Encounter (Signed)
Will call pt to schedule TCS w/Propofol ASA 3 when Dr. Roseanne Kaufman future schedules are available.

## 2021-04-17 NOTE — Telephone Encounter (Signed)
Update noted.  Thanks.

## 2021-04-17 NOTE — Telephone Encounter (Addendum)
Awaiting to schedule CTE abd/pelvis d/t global shortage of contrast.   Tried to call pt to inform him we have to wait to schedule CT d/t shortage of contrast, LMOAM to inform him.  FYI to Dr. Gala Romney.

## 2021-04-22 DIAGNOSIS — D472 Monoclonal gammopathy: Secondary | ICD-10-CM | POA: Diagnosis not present

## 2021-04-22 DIAGNOSIS — N1832 Chronic kidney disease, stage 3b: Secondary | ICD-10-CM | POA: Diagnosis not present

## 2021-04-22 DIAGNOSIS — E782 Mixed hyperlipidemia: Secondary | ICD-10-CM | POA: Diagnosis not present

## 2021-04-22 DIAGNOSIS — R7301 Impaired fasting glucose: Secondary | ICD-10-CM | POA: Diagnosis not present

## 2021-04-22 DIAGNOSIS — E039 Hypothyroidism, unspecified: Secondary | ICD-10-CM | POA: Diagnosis not present

## 2021-04-22 DIAGNOSIS — E875 Hyperkalemia: Secondary | ICD-10-CM | POA: Diagnosis not present

## 2021-04-22 DIAGNOSIS — G629 Polyneuropathy, unspecified: Secondary | ICD-10-CM | POA: Diagnosis not present

## 2021-04-22 DIAGNOSIS — I10 Essential (primary) hypertension: Secondary | ICD-10-CM | POA: Diagnosis not present

## 2021-04-22 DIAGNOSIS — N401 Enlarged prostate with lower urinary tract symptoms: Secondary | ICD-10-CM | POA: Diagnosis not present

## 2021-04-22 DIAGNOSIS — J4530 Mild persistent asthma, uncomplicated: Secondary | ICD-10-CM | POA: Diagnosis not present

## 2021-04-22 DIAGNOSIS — E559 Vitamin D deficiency, unspecified: Secondary | ICD-10-CM | POA: Diagnosis not present

## 2021-04-27 ENCOUNTER — Encounter: Payer: Self-pay | Admitting: *Deleted

## 2021-04-27 MED ORDER — PEG 3350-KCL-NA BICARB-NACL 420 G PO SOLR
ORAL | 0 refills | Status: DC
Start: 2021-04-27 — End: 2022-06-29

## 2021-04-27 NOTE — Addendum Note (Signed)
Addended by: Cheron Every on: 04/27/2021 12:31 PM   Modules accepted: Orders

## 2021-04-27 NOTE — Telephone Encounter (Signed)
Called pt. He has been scheduled for 7/11 at 12:15pm. Aware will mail instructions with pre-op appt. Confirmed address. Confirmed pharmacy.

## 2021-05-05 DIAGNOSIS — Z23 Encounter for immunization: Secondary | ICD-10-CM | POA: Diagnosis not present

## 2021-05-13 ENCOUNTER — Other Ambulatory Visit: Payer: Self-pay

## 2021-05-13 DIAGNOSIS — K3189 Other diseases of stomach and duodenum: Secondary | ICD-10-CM

## 2021-05-13 NOTE — Progress Notes (Unsigned)
t

## 2021-05-13 NOTE — Telephone Encounter (Signed)
PA for CTE abd/pelvis w/contrast submitted via [employer] website. Order ID: [id number], valid 05/13/21-06/11/21.

## 2021-05-13 NOTE — Telephone Encounter (Signed)
CTE abdomen/pelvis w/contrast scheduled for 06/03/21 at 2:00pm, arrive at 12:45pm. NPO 4 hours prior to test.   Tried to call pt, LMOVM for inform him of CTE appt. Appt letter mailed.

## 2021-05-26 NOTE — Patient Instructions (Signed)
[name]  05/26/2021     @PREFPERIOPPHARMACY @   Your procedure is scheduled on  06/01/2021.   Report to [facility] at  1015  A.M.  Call this number if you have problems the morning of surgery:  [phone]   Remember:  Follow the diet and prep instructions given to you by the office.    Take these medicines the morning of surgery with A SIP OF WATER    amlodipine, levothyroxine, protonix, flomax.  Use your nebulizer and your inhaler before you come and bring your rescue inhaler with you.    Do not wear jewelry, make-up or nail polish.  Do not wear lotions, powders, or perfumes, or deodorant.  Do not shave 48 hours prior to surgery.  Men may shave face and neck.  Do not bring valuables to the hospital.  [facility] is not responsible for any belongings or valuables.  Contacts, dentures or bridgework may not be worn into surgery.  Leave your suitcase in the car.  After surgery it may be brought to your room.  For patients admitted to the hospital, discharge time will be determined by your treatment team.  Patients discharged the day of surgery will not be allowed to drive home and must have someone with them for 24 hours.    Special instructions:   DO NOT smoke tobacco or vape for 24 hours before your procedure.  Please read over the following fact sheets that you were given. Anesthesia Post-op Instructions and Care and Recovery After Surgery      Colonoscopy, Adult, Care After This sheet gives you information about how to care for yourself after your procedure. Your health care provider may also give you more specific instructions. If you have problems or questions, contact your health careprovider. What can I expect after the procedure? After the procedure, it is common to have: A small amount of blood in your stool for 24 hours after the procedure. Some gas. Mild cramping or bloating of your abdomen. Follow these instructions at home: Eating and  drinking  Drink enough fluid to keep your urine pale yellow. Follow instructions from your health care provider about eating or drinking restrictions. Resume your normal diet as instructed by your health care provider. Avoid heavy or fried foods that are hard to digest.  Activity Rest as told by your health care provider. Avoid sitting for a long time without moving. Get up to take short walks every 1-2 hours. This is important to improve blood flow and breathing. Ask for help if you feel weak or unsteady. Return to your normal activities as told by your health care provider. Ask your health care provider what activities are safe for you. Managing cramping and bloating  Try walking around when you have cramps or feel bloated. Apply heat to your abdomen as told by your health care provider. Use the heat source that your health care provider recommends, such as a moist heat pack or a heating pad. Place a towel between your skin and the heat source. Leave the heat on for 20-30 minutes. Remove the heat if your skin turns bright red. This is especially important if you are unable to feel pain, heat, or cold. You may have a greater risk of getting burned.  General instructions If you were given a sedative during the procedure, it can affect you for several hours. Do not drive or operate machinery until your health care provider says that it is  safe. For the first 24 hours after the procedure: Do not sign important documents. Do not drink alcohol. Do your regular daily activities at a slower pace than normal. Eat soft foods that are easy to digest. Take over-the-counter and prescription medicines only as told by your health care provider. Keep all follow-up visits as told by your health care provider. This is important. Contact a health care provider if: You have blood in your stool 2-3 days after the procedure. Get help right away if you have: More than a small spotting of blood in your  stool. Large blood clots in your stool. Swelling of your abdomen. Nausea or vomiting. A fever. Increasing pain in your abdomen that is not relieved with medicine. Summary After the procedure, it is common to have a small amount of blood in your stool. You may also have mild cramping and bloating of your abdomen. If you were given a sedative during the procedure, it can affect you for several hours. Do not drive or operate machinery until your health care provider says that it is safe. Get help right away if you have a lot of blood in your stool, nausea or vomiting, a fever, or increased pain in your abdomen. This information is not intended to replace advice given to you by your health care provider. Make sure you discuss any questions you have with your healthcare provider. Document Revised: 11/02/2019 Document Reviewed: 06/04/2019 Elsevier Patient Education  [place] After This sheet gives you information about how to care for yourself after your procedure. Your health care provider may also give you more specific instructions. If you have problems or questions, contact your health careprovider. What can I expect after the procedure? After the procedure, it is common to have: Tiredness. Forgetfulness about what happened after the procedure. Impaired judgment for important decisions. Nausea or vomiting. Some difficulty with balance. Follow these instructions at home: For the time period you were told by your health care provider:     Rest as needed. Do not participate in activities where you could fall or become injured. Do not drive or use machinery. Do not drink alcohol. Do not take sleeping pills or medicines that cause drowsiness. Do not make important decisions or sign legal documents. Do not take care of children on your own. Eating and drinking Follow the diet that is recommended by your health care provider. Drink enough fluid to  keep your urine pale yellow. If you vomit: Drink water, juice, or soup when you can drink without vomiting. Make sure you have little or no nausea before eating solid foods. General instructions Have a responsible adult stay with you for the time you are told. It is important to have someone help care for you until you are awake and alert. Take over-the-counter and prescription medicines only as told by your health care provider. If you have sleep apnea, surgery and certain medicines can increase your risk for breathing problems. Follow instructions from your health care provider about wearing your sleep device: Anytime you are sleeping, including during daytime naps. While taking prescription pain medicines, sleeping medicines, or medicines that make you drowsy. Avoid smoking. Keep all follow-up visits as told by your health care provider. This is important. Contact a health care provider if: You keep feeling nauseous or you keep vomiting. You feel light-headed. You are still sleepy or having trouble with balance after 24 hours. You develop a rash. You have a fever. You have redness or  swelling around the IV site. Get help right away if: You have trouble breathing. You have new-onset confusion at home. Summary For several hours after your procedure, you may feel tired. You may also be forgetful and have poor judgment. Have a responsible adult stay with you for the time you are told. It is important to have someone help care for you until you are awake and alert. Rest as told. Do not drive or operate machinery. Do not drink alcohol or take sleeping pills. Get help right away if you have trouble breathing, or if you suddenly become confused. This information is not intended to replace advice given to you by your health care provider. Make sure you discuss any questions you have with your healthcare provider. Document Revised: 07/24/2020 Document Reviewed: 10/11/2019 Elsevier Patient  Education  2022 [employer].

## 2021-05-27 ENCOUNTER — Other Ambulatory Visit: Payer: Self-pay

## 2021-05-27 ENCOUNTER — Encounter (HOSPITAL_COMMUNITY)
Admission: RE | Admit: 2021-05-27 | Discharge: 2021-05-27 | Disposition: A | Discharge status: Home / Self Care | Payer: Medicare Other | Source: Ambulatory Visit | Attending: Internal Medicine | Admitting: Internal Medicine

## 2021-05-27 DIAGNOSIS — Z01818 Encounter for other preprocedural examination: Secondary | ICD-10-CM | POA: Insufficient documentation

## 2021-05-27 LAB — BASIC METABOLIC PANEL
Anion gap: 11 (ref 5–15)
BUN: 15 mg/dL (ref 8–23)
CO2: 27 mmol/L (ref 22–32)
Calcium: 9.4 mg/dL (ref 8.9–10.3)
Chloride: 94 mmol/L — ABNORMAL LOW (ref 98–111)
Creatinine, Ser: 1.38 mg/dL — ABNORMAL HIGH (ref 0.61–1.24)
GFR, Estimated: 52 mL/min — ABNORMAL LOW (ref >60)
Glucose, Bld: 116 mg/dL — ABNORMAL HIGH (ref 70–99)
Potassium: 3.3 mmol/L — ABNORMAL LOW (ref 3.5–5.1)
Sodium: 132 mmol/L — ABNORMAL LOW (ref 135–145)

## 2021-06-01 ENCOUNTER — Other Ambulatory Visit: Payer: Self-pay

## 2021-06-01 ENCOUNTER — Encounter (HOSPITAL_COMMUNITY)
Admission: RE | Disposition: A | Discharge status: Home / Self Care | Payer: Self-pay | Source: Home / Self Care | Attending: Internal Medicine

## 2021-06-01 ENCOUNTER — Ambulatory Visit (HOSPITAL_COMMUNITY): Payer: Medicare Other | Admitting: Anesthesiology

## 2021-06-01 ENCOUNTER — Ambulatory Visit (HOSPITAL_COMMUNITY)
Admission: RE | Admit: 2021-06-01 | Discharge: 2021-06-01 | Disposition: A | Discharge status: Home / Self Care | Payer: Medicare Other | Attending: Internal Medicine | Admitting: Internal Medicine

## 2021-06-01 ENCOUNTER — Encounter (HOSPITAL_COMMUNITY): Payer: Self-pay | Admitting: Internal Medicine

## 2021-06-01 DIAGNOSIS — Z888 Allergy status to other drugs, medicaments and biological substances status: Secondary | ICD-10-CM | POA: Diagnosis not present

## 2021-06-01 DIAGNOSIS — Z79899 Other long term (current) drug therapy: Secondary | ICD-10-CM | POA: Insufficient documentation

## 2021-06-01 DIAGNOSIS — D120 Benign neoplasm of cecum: Secondary | ICD-10-CM | POA: Insufficient documentation

## 2021-06-01 DIAGNOSIS — K635 Polyp of colon: Secondary | ICD-10-CM

## 2021-06-01 DIAGNOSIS — Z1211 Encounter for screening for malignant neoplasm of colon: Secondary | ICD-10-CM | POA: Insufficient documentation

## 2021-06-01 DIAGNOSIS — Z8249 Family history of ischemic heart disease and other diseases of the circulatory system: Secondary | ICD-10-CM | POA: Diagnosis not present

## 2021-06-01 DIAGNOSIS — Z8349 Family history of other endocrine, nutritional and metabolic diseases: Secondary | ICD-10-CM | POA: Diagnosis not present

## 2021-06-01 DIAGNOSIS — K5730 Diverticulosis of large intestine without perforation or abscess without bleeding: Secondary | ICD-10-CM | POA: Insufficient documentation

## 2021-06-01 DIAGNOSIS — D128 Benign neoplasm of rectum: Secondary | ICD-10-CM | POA: Diagnosis not present

## 2021-06-01 DIAGNOSIS — Z882 Allergy status to sulfonamides status: Secondary | ICD-10-CM | POA: Insufficient documentation

## 2021-06-01 DIAGNOSIS — K219 Gastro-esophageal reflux disease without esophagitis: Secondary | ICD-10-CM | POA: Diagnosis not present

## 2021-06-01 DIAGNOSIS — K640 First degree hemorrhoids: Secondary | ICD-10-CM | POA: Insufficient documentation

## 2021-06-01 DIAGNOSIS — Z859 Personal history of malignant neoplasm, unspecified: Secondary | ICD-10-CM | POA: Diagnosis not present

## 2021-06-01 DIAGNOSIS — Z7982 Long term (current) use of aspirin: Secondary | ICD-10-CM | POA: Insufficient documentation

## 2021-06-01 DIAGNOSIS — K621 Rectal polyp: Secondary | ICD-10-CM | POA: Diagnosis not present

## 2021-06-01 DIAGNOSIS — E039 Hypothyroidism, unspecified: Secondary | ICD-10-CM | POA: Diagnosis not present

## 2021-06-01 DIAGNOSIS — Z86010 Personal history of colonic polyps: Secondary | ICD-10-CM

## 2021-06-01 HISTORY — PX: POLYPECTOMY: SHX149

## 2021-06-01 HISTORY — PX: COLONOSCOPY WITH PROPOFOL: SHX5780

## 2021-06-01 SURGERY — COLONOSCOPY WITH PROPOFOL
Anesthesia: Monitor Anesthesia Care

## 2021-06-01 MED ORDER — LACTATED RINGERS IV SOLN
INTRAVENOUS | Status: DC
  Administered 2021-06-01: 1000 mL via INTRAVENOUS

## 2021-06-01 MED ORDER — PROPOFOL 10 MG/ML IV BOLUS
INTRAVENOUS | Status: DC | PRN
  Administered 2021-06-01: 30 mg via INTRAVENOUS
  Administered 2021-06-01: 50 mg via INTRAVENOUS
  Administered 2021-06-01: 30 mg via INTRAVENOUS
  Administered 2021-06-01: 100 mg via INTRAVENOUS
  Administered 2021-06-01: 40 mg via INTRAVENOUS
  Administered 2021-06-01: 20 mg via INTRAVENOUS

## 2021-06-01 MED ORDER — STERILE WATER FOR IRRIGATION IR SOLN
Status: DC | PRN
  Administered 2021-06-01: 200 mL

## 2021-06-01 MED ORDER — PROPOFOL 500 MG/50ML IV EMUL
INTRAVENOUS | Status: DC | PRN
  Administered 2021-06-01: 125 ug/kg/min via INTRAVENOUS

## 2021-06-01 NOTE — Discharge Instructions (Signed)
  Colonoscopy Discharge Instructions  Read the instructions outlined below and refer to this sheet in the next few weeks. These discharge instructions provide you with general information on caring for yourself after you leave the hospital. Your doctor may also give you specific instructions. While your treatment has been planned according to the most current medical practices available, unavoidable complications occasionally occur. If you have any problems or questions after discharge, call Dr. Gala Romney at [phone]. ACTIVITY You may resume your regular activity, but move at a slower pace for the next 24 hours.  Take frequent rest periods for the next 24 hours.  Walking will help get rid of the air and reduce the bloated feeling in your belly (abdomen).  No driving for 24 hours (because of the medicine (anesthesia) used during the test).   Do not sign any important legal documents or operate any machinery for 24 hours (because of the anesthesia used during the test).  NUTRITION Drink plenty of fluids.  You may resume your normal diet as instructed by your doctor.  Begin with a light meal and progress to your normal diet. Heavy or fried foods are harder to digest and may make you feel sick to your stomach (nauseated).  Avoid alcoholic beverages for 24 hours or as instructed.  MEDICATIONS You may resume your normal medications unless your doctor tells you otherwise.  WHAT YOU CAN EXPECT TODAY Some feelings of bloating in the abdomen.  Passage of more gas than usual.  Spotting of blood in your stool or on the toilet paper.  IF YOU HAD POLYPS REMOVED DURING THE COLONOSCOPY: No aspirin products for 7 days or as instructed.  No alcohol for 7 days or as instructed.  Eat a soft diet for the next 24 hours.  FINDING OUT THE RESULTS OF YOUR TEST Not all test results are available during your visit. If your test results are not back during the visit, make an appointment with your caregiver to find out the  results. Do not assume everything is normal if you have not heard from your caregiver or the medical facility. It is important for you to follow up on all of your test results.  SEEK IMMEDIATE MEDICAL ATTENTION IF: You have more than a spotting of blood in your stool.  Your belly is swollen (abdominal distention).  You are nauseated or vomiting.  You have a temperature over 101.  You have abdominal pain or discomfort that is severe or gets worse throughout the day.     2 small polyps removed today  Diverticulosis and polyp information provided  Further recommendations to follow pending review of pathology report  At patient request, I called Elisabeth Pigeon at [phone] and reviewed results

## 2021-06-01 NOTE — Anesthesia Postprocedure Evaluation (Signed)
Anesthesia Post Note  Patient: [name]  Procedure(s) Performed: COLONOSCOPY WITH PROPOFOL POLYPECTOMY INTESTINAL  Patient location during evaluation: Phase II Anesthesia Type: MAC Level of consciousness: awake Pain management: pain level controlled Vital Signs Assessment: post-procedure vital signs reviewed and stable Respiratory status: spontaneous breathing and respiratory function stable Cardiovascular status: blood pressure returned to baseline and stable Postop Assessment: no headache and no apparent nausea or vomiting Anesthetic complications: no Comments: Late entry   No notable events documented.   Last Vitals:  Vitals:   06/01/21 1048 06/01/21 1243  BP: (!) 151/72 (!) 106/55  Pulse: 69 85  Resp: 12 16  Temp: 36.6 C 36.5 C  SpO2: 98% 98%    Last Pain:  Vitals:   06/01/21 1243  TempSrc: Axillary  PainSc: 0-No pain                 Louann Sjogren

## 2021-06-01 NOTE — H&P (Signed)
@LOGO @   Primary Care Physician:  Celene Squibb, MD Primary Gastroenterologist:  Dr. Gala Romney  Pre-Procedure History & Physical: HPI:  [name] is a 80 y.o. male here for surveillance colonoscopy.  History of multiple colonic adenomas removed his colon 2014.  Past Medical History:  Diagnosis Date   Asthma    Blood transfusion abn reaction or complication, no procedure mishap 1979   reaction due to wrong blood type given   GERD (gastroesophageal reflux disease)    occasional   H/O: pneumonia 02/27/2015   Hyperlipidemia    Hypertension    Hypothyroidism    MGUS (monoclonal gammopathy of unknown significance)    Myocardial infarction (HCC)    hx of abnormal ekg showed prior myocardial infarction   Renal disorder    Vitamin B 12 deficiency 09/30/2016    Past Surgical History:  Procedure Laterality Date   [place]   COLONOSCOPY  03/28/2008   HQI:ONGEXB rectum; left-sided transverse diverticula diminutive polyp; descending colon. Adenomas.   COLONOSCOPY N/A 06/07/2013   Procedure: COLONOSCOPY;  Surgeon: Daneil Dolin, MD;  Location: [facility];  Service: Endoscopy;  Laterality: N/A;  9:45   ESOPHAGOGASTRODUODENOSCOPY N/A 02/25/2021   Procedure: ESOPHAGOGASTRODUODENOSCOPY (EGD);  Surgeon: Daneil Dolin, MD;  Location: [facility];  Service: Endoscopy;  Laterality: N/A;  pm appt   HERNIA REPAIR  2005   LUMBAR LAMINECTOMY/DECOMPRESSION MICRODISCECTOMY  07/26/2012   Procedure: LUMBAR LAMINECTOMY/DECOMPRESSION MICRODISCECTOMY;  Surgeon: Johnn Hai, MD;  Location: [facility];  Service: Orthopedics;  Laterality: N/A;  Lumbar Decompression L4-L5   MALONEY DILATION N/A 02/25/2021   Procedure: Venia Minks DILATION;  Surgeon: Daneil Dolin, MD;  Location: [facility];  Service: Endoscopy;  Laterality: N/A;   STOMACH SURGERY  2003   exploratory surgery, fatty tumors with obstruction?   TONSILLECTOMY  1962    Prior to Admission medications   Medication  Sig Start Date End Date Taking? Authorizing Provider  amLODipine (NORVASC) 5 MG tablet Take 5 mg by mouth daily. 04/22/17  Yes [provider]  aspirin EC 81 MG tablet Take 81 mg by mouth daily.   Yes [provider]  carboxymethylcellulose (REFRESH PLUS) 0.5 % SOLN Place 1 drop into both eyes 3 (three) times daily as needed (dry eyes).   Yes [provider]  Cholecalciferol (VITAMIN D3 MAXIMUM STRENGTH) 125 MCG (5000 UT) capsule Take 5,000 Units by mouth daily.   Yes [provider]  Cyanocobalamin (VITAMIN B-12) 5000 MCG TBDP Take 5,000 mcg by mouth daily.   Yes [provider]  Fluticasone-Salmeterol (WIXELA INHUB IN) Inhale 1 puff into the lungs daily as needed (Allergies and Asthma).   Yes [provider]  guaiFENesin (MUCINEX) 600 MG 12 hr tablet Take 600 mg by mouth every other day.   Yes [provider]  levothyroxine (SYNTHROID, LEVOTHROID) 75 MCG tablet Take 75 mcg by mouth daily before breakfast.   Yes [provider]  pantoprazole (PROTONIX) 40 MG tablet Take 1 tablet by mouth daily as needed (Acid reflex). 02/26/21  Yes [provider]  simvastatin (ZOCOR) 40 MG tablet Take 40 mg by mouth at bedtime.   Yes [provider]  tamsulosin (FLOMAX) 0.4 MG CAPS capsule Take 0.4 mg by mouth daily.   Yes [provider]  triamterene-hydrochlorothiazide (DYAZIDE) 37.5-25 MG capsule Take 1 capsule by mouth daily.   Yes [provider]  zinc gluconate 50 MG tablet Take 50 mg by  mouth every other day.   Yes [provider]  levalbuterol (XOPENEX) 1.25 MG/3ML nebulizer solution Inhale 1.25 mg into the lungs 3 (three) times daily as needed for wheezing. 04/24/13   [provider]  polyethylene glycol-electrolytes (NULYTELY) 420 g solution As directed 04/27/21   Tanzie Rothschild, Cristopher Estimable, MD    Allergies as of 04/27/2021 - Review Complete 04/14/2021  Allergen Reaction Noted   Meperidine and  related Other (See Comments) 07/18/2012   Sulfa antibiotics Rash 07/18/2012    Family History  Problem Relation Age of Onset   Thyroid disease Mother    Heart disease Mother    Hypertension Mother    Clotting disorder Father    Hypertension Father    Cancer Sister    Hypertension Sister    Colon cancer Neg Hx     Social History   Socioeconomic History   Marital status: Married    Spouse name: Not on file   Number of children: Not on file   Years of education: Not on file   Highest education level: Not on file  Occupational History   Not on file  Tobacco Use   Smoking status: Never   Smokeless tobacco: Never  Vaping Use   Vaping Use: Never used  Substance and Sexual Activity   Alcohol use: Yes    Comment: occasional   Drug use: No   Sexual activity: Never  Other Topics Concern   Not on file  Social History Narrative   Not on file   Social Determinants of Health   Financial Resource Strain: Low Risk    Difficulty of Paying Living Expenses: Not hard at all  Food Insecurity: No Food Insecurity   Worried About Charity fundraiser in the Last Year: Never true   [place] in the Last Year: Never true  Transportation Needs: No Transportation Needs   Lack of Transportation (Medical): No   Lack of Transportation (Non-Medical): No  Physical Activity: Sufficiently Active   Days of Exercise per Week: 7 days   Minutes of Exercise per Session: 60 min  Stress: No Stress Concern Present   Feeling of Stress : Not at all  Social Connections: Moderately Isolated   Frequency of Communication with Friends and Family: More than three times a week   Frequency of Social Gatherings with Friends and Family: Once a week   Attends Religious Services: Never   Marine scientist or Organizations: No   Attends Music therapist: Never   Marital Status: Married  Human resources officer Violence: Not At Risk   Fear of Current or Ex-Partner: No   Emotionally Abused: No    Physically Abused: No   Sexually Abused: No    Review of Systems: See HPI, otherwise negative ROS  Physical Exam: BP (!) 151/72   Pulse 69   Temp 97.8 F (36.6 C) (Oral)   Resp 12   SpO2 98%  General:   Alert,  Well-developed, well-nourished, pleasant and cooperative in NAD Neck:  Supple; no masses or thyromegaly. No significant cervical adenopathy. Lungs:  Clear throughout to auscultation.   No wheezes, crackles, or rhonchi. No acute distress. Heart:  Regular rate and rhythm; no murmurs, clicks, rubs,  or gallops. Abdomen: Non-distended, normal bowel sounds.  Soft and nontender without appreciable mass or hepatosplenomegaly.  Pulses:  Normal pulses noted. Extremities:  Without clubbing or edema.  Impression/Plan: Very pleasant 80 year old gentleman history of colonic adenomas.  He is here for surveillance examination per  plan.  I have Offered the patient a surveillance colonoscopy today. The risks, benefits, limitations, alternatives and imponderables have been reviewed with the patient. Questions have been answered. All parties are agreeable.      Notice: This dictation was prepared with Dragon dictation along with smaller phrase technology. Any transcriptional errors that result from this process are unintentional and may not be corrected upon review.

## 2021-06-01 NOTE — Anesthesia Preprocedure Evaluation (Signed)
Anesthesia Evaluation  Patient identified by MRN, date of birth, ID band Patient awake    Reviewed: Allergy & Precautions, H&P , NPO status , Patient's Chart, lab work & pertinent test results, reviewed documented beta blocker date and time   Airway Mallampati: II  TM Distance: >3 FB Neck ROM: full    Dental no notable dental hx.    Pulmonary asthma ,    Pulmonary exam normal breath sounds clear to auscultation       Cardiovascular Exercise Tolerance: Good hypertension, negative cardio ROS   Rhythm:regular Rate:Normal     Neuro/Psych negative neurological ROS  negative psych ROS   GI/Hepatic Neg liver ROS, GERD  Medicated,  Endo/Other  Hypothyroidism   Renal/GU Renal disease  negative genitourinary   Musculoskeletal negative musculoskeletal ROS (+)   Abdominal   Peds negative pediatric ROS (+)  Hematology negative hematology ROS (+)   Anesthesia Other Findings   Reproductive/Obstetrics negative OB ROS                             Anesthesia Physical Anesthesia Plan  ASA: 3  Anesthesia Plan: MAC   Post-op Pain Management:    Induction:   PONV Risk Score and Plan:   Airway Management Planned:   Additional Equipment:   Intra-op Plan:   Post-operative Plan:   Informed Consent: I have reviewed the patients History and Physical, chart, labs and discussed the procedure including the risks, benefits and alternatives for the proposed anesthesia with the patient or authorized representative who has indicated his/her understanding and acceptance.     Dental Advisory Given  Plan Discussed with: CRNA  Anesthesia Plan Comments:         Anesthesia Quick Evaluation

## 2021-06-01 NOTE — Transfer of Care (Signed)
Immediate Anesthesia Transfer of Care Note  Patient: [name]  Procedure(s) Performed: COLONOSCOPY WITH PROPOFOL POLYPECTOMY INTESTINAL  Patient Location: Short Stay  Anesthesia Type:General  Level of Consciousness: awake  Airway & Oxygen Therapy: Patient Spontanous Breathing  Post-op Assessment: Report given to RN and Post -op Vital signs reviewed and stable  Post vital signs: Reviewed and stable  Last Vitals:  Vitals Value Taken Time  BP    Temp    Pulse    Resp    SpO2      Last Pain:  Vitals:   06/01/21 1048  TempSrc: Oral  PainSc: 0-No pain      Patients Stated Pain Goal: 5 (29/57/47 3403)  Complications: No notable events documented.

## 2021-06-01 NOTE — Op Note (Signed)
[facility] Patient Name: [name] Procedure Date: 06/01/2021 11:53 AM MRN: [id number] Date of Birth: [birth year] Attending MD: Norvel Richards , MD CSN: [id number] Age: 80 Admit Type: Outpatient Procedure:                Colonoscopy Indications:              High risk colon cancer surveillance: Personal                            history of colonic polyps Providers:                Norvel Richards, MD, Crystal Page, Aram Candela Referring MD:              Medicines:                Propofol per Anesthesia Complications:            No immediate complications. Estimated Blood Loss:     Estimated blood loss was minimal. Procedure:                Pre-Anesthesia Assessment:                           - Prior to the procedure, a History and Physical                            was performed, and patient medications and                            allergies were reviewed. The patient's tolerance of                            previous anesthesia was also reviewed. The risks                            and benefits of the procedure and the sedation                            options and risks were discussed with the patient.                            All questions were answered, and informed consent                            was obtained. Prior Anticoagulants: The patient has                            taken no previous anticoagulant or antiplatelet                            agents. ASA Grade Assessment: III - A patient with                            severe systemic disease. After reviewing the risks  and benefits, the patient was deemed in                            satisfactory condition to undergo the procedure.                           After obtaining informed consent, the colonoscope                            was passed under direct vision. Throughout the                            procedure, the patient's blood pressure, pulse, and                             oxygen saturations were monitored continuously. The                            CF-[id number] ([id number]) scope was introduced through                            the anus and advanced to the the cecum, identified                            by appendiceal orifice and ileocecal valve. The                            colonoscopy was performed without difficulty. The                            patient tolerated the procedure well. The quality                            of the bowel preparation was adequate. Scope In: 12:13:59 PM Scope Out: 12:31:26 PM Scope Withdrawal Time: 0 hours 10 minutes 39 seconds  Total Procedure Duration: 0 hours 17 minutes 27 seconds  Findings:      The perianal and digital rectal examinations were normal.      Scattered small and large-mouthed diverticula were found in the entire       colon.      Two sessile polyps were found in the mid rectum and cecum. The polyps       were 2 to 4 mm in size. These polyps were removed with a cold snare.       Resection and retrieval were complete. Estimated blood loss was minimal.      Non-bleeding internal hemorrhoids were found during retroflexion. The       hemorrhoids were moderate, medium-sized and Grade I (internal       hemorrhoids that do not prolapse).      The exam was otherwise without abnormality on direct and retroflexion       views. Impression:               - Diverticulosis in the entire examined colon.                           -  Two 2 to 4 mm polyps in the mid rectum and in the                            cecum, removed with a cold snare. Resected and                            retrieved.                           - Non-bleeding internal hemorrhoids.                           - The examination was otherwise normal on direct                            and retroflexion views. Moderate Sedation:      Moderate (conscious) sedation was personally administered by an       anesthesia professional. The  following parameters were monitored: oxygen       saturation, heart rate, blood pressure, respiratory rate, EKG, adequacy       of pulmonary ventilation, and response to care. Recommendation:           - Patient has a contact number available for                            emergencies. The signs and symptoms of potential                            delayed complications were discussed with the                            patient. Return to normal activities tomorrow.                            Written discharge instructions were provided to the                            patient.                           - Resume previous diet.                           - Continue present medications.                           - Repeat colonoscopy after studies are complete for                            surveillance.                           - Return to GI office (date not yet determined). Procedure Code(s):        --- Professional ---  45385, Colonoscopy, flexible; with removal of                            tumor(s), polyp(s), or other lesion(s) by snare                            technique Diagnosis Code(s):        --- Professional ---                           Z86.010, Personal history of colonic polyps                           K64.0, First degree hemorrhoids                           K62.1, Rectal polyp                           K63.5, Polyp of colon                           K57.30, Diverticulosis of large intestine without                            perforation or abscess without bleeding CPT copyright 2019 American Medical Association. All rights reserved. The codes documented in this report are preliminary and upon coder review may  be revised to meet current compliance requirements. Cristopher Estimable. Rachyl Wuebker, MD Norvel Richards, MD 06/01/2021 12:35:59 PM This report has been signed electronically. Number of Addenda: 0

## 2021-06-02 ENCOUNTER — Encounter: Payer: Self-pay | Admitting: Internal Medicine

## 2021-06-02 LAB — SURGICAL PATHOLOGY

## 2021-06-03 ENCOUNTER — Other Ambulatory Visit: Payer: Self-pay

## 2021-06-03 ENCOUNTER — Ambulatory Visit (HOSPITAL_COMMUNITY)
Admission: RE | Admit: 2021-06-03 | Discharge: 2021-06-03 | Disposition: A | Discharge status: Home / Self Care | Payer: Medicare Other | Source: Ambulatory Visit | Attending: Internal Medicine | Admitting: Internal Medicine

## 2021-06-03 ENCOUNTER — Encounter (HOSPITAL_COMMUNITY): Payer: Self-pay | Admitting: Radiology

## 2021-06-03 DIAGNOSIS — K3189 Other diseases of stomach and duodenum: Secondary | ICD-10-CM | POA: Diagnosis not present

## 2021-06-03 DIAGNOSIS — I700 Atherosclerosis of aorta: Secondary | ICD-10-CM | POA: Diagnosis not present

## 2021-06-03 DIAGNOSIS — Z9049 Acquired absence of other specified parts of digestive tract: Secondary | ICD-10-CM | POA: Diagnosis not present

## 2021-06-03 DIAGNOSIS — K5730 Diverticulosis of large intestine without perforation or abscess without bleeding: Secondary | ICD-10-CM | POA: Diagnosis not present

## 2021-06-03 MED ORDER — IOHEXOL 300 MG/ML  SOLN
100.0000 mL | Freq: Once | INTRAMUSCULAR | Status: AC | PRN
  Administered 2021-06-03: 100 mL via INTRAVENOUS

## 2021-06-03 MED ORDER — BARIUM SULFATE 0.1 % PO SUSP
ORAL | Status: AC
  Filled 2021-06-03: qty 3

## 2021-06-05 ENCOUNTER — Encounter (HOSPITAL_COMMUNITY): Payer: Self-pay | Admitting: Internal Medicine

## 2021-06-08 ENCOUNTER — Telehealth: Payer: Self-pay | Admitting: *Deleted

## 2021-06-08 NOTE — Telephone Encounter (Signed)
Spoke to pt.  See result note.

## 2021-06-08 NOTE — Telephone Encounter (Signed)
Pt returning call to [place]. [phone]

## 2021-06-09 DIAGNOSIS — M79642 Pain in left hand: Secondary | ICD-10-CM | POA: Diagnosis not present

## 2021-06-09 DIAGNOSIS — I129 Hypertensive chronic kidney disease with stage 1 through stage 4 chronic kidney disease, or unspecified chronic kidney disease: Secondary | ICD-10-CM | POA: Diagnosis not present

## 2021-06-09 DIAGNOSIS — M79641 Pain in right hand: Secondary | ICD-10-CM | POA: Diagnosis not present

## 2021-06-09 DIAGNOSIS — E876 Hypokalemia: Secondary | ICD-10-CM | POA: Diagnosis not present

## 2021-06-09 DIAGNOSIS — Z6828 Body mass index (BMI) 28.0-28.9, adult: Secondary | ICD-10-CM | POA: Diagnosis not present

## 2021-06-09 DIAGNOSIS — N1832 Chronic kidney disease, stage 3b: Secondary | ICD-10-CM | POA: Diagnosis not present

## 2021-06-09 DIAGNOSIS — R809 Proteinuria, unspecified: Secondary | ICD-10-CM | POA: Diagnosis not present

## 2021-06-17 DIAGNOSIS — D472 Monoclonal gammopathy: Secondary | ICD-10-CM | POA: Diagnosis not present

## 2021-06-17 DIAGNOSIS — I129 Hypertensive chronic kidney disease with stage 1 through stage 4 chronic kidney disease, or unspecified chronic kidney disease: Secondary | ICD-10-CM | POA: Diagnosis not present

## 2021-06-17 DIAGNOSIS — N1832 Chronic kidney disease, stage 3b: Secondary | ICD-10-CM | POA: Diagnosis not present

## 2021-06-17 DIAGNOSIS — R809 Proteinuria, unspecified: Secondary | ICD-10-CM | POA: Diagnosis not present

## 2021-09-17 DIAGNOSIS — J019 Acute sinusitis, unspecified: Secondary | ICD-10-CM | POA: Diagnosis not present

## 2021-09-21 IMAGING — DX DG BONE SURVEY MET
9 of 10 series · 9 of 10 positions shown · non-contrast
Comparison: 12/03/2015

CLINICAL DATA: MGUS with hypercalcemia

EXAM:
METASTATIC BONE SURVEY

[skull lat]
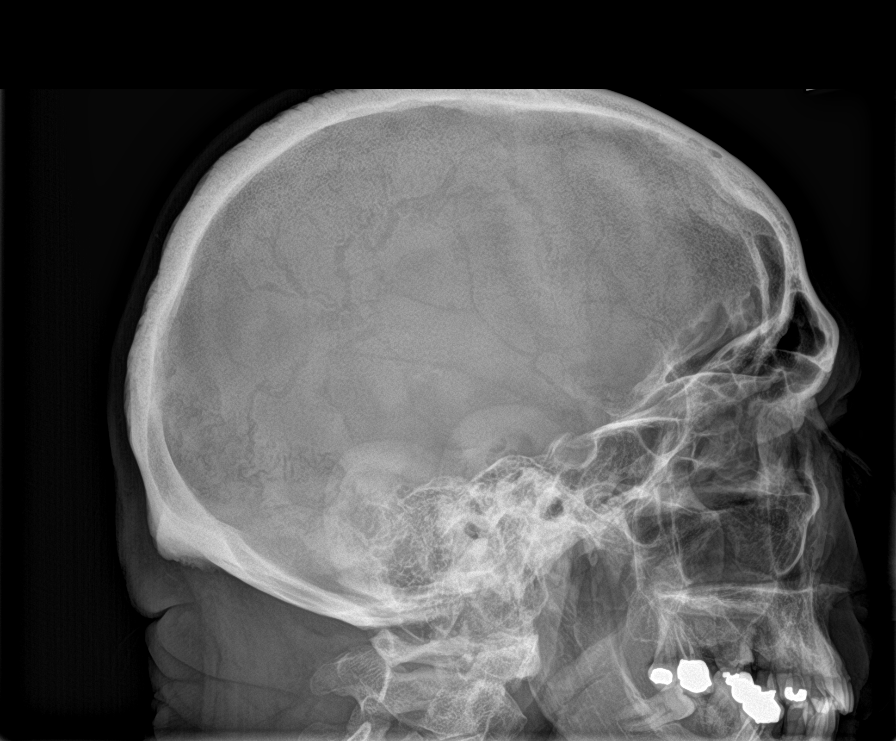

[shoulder ap (1 of 2)]
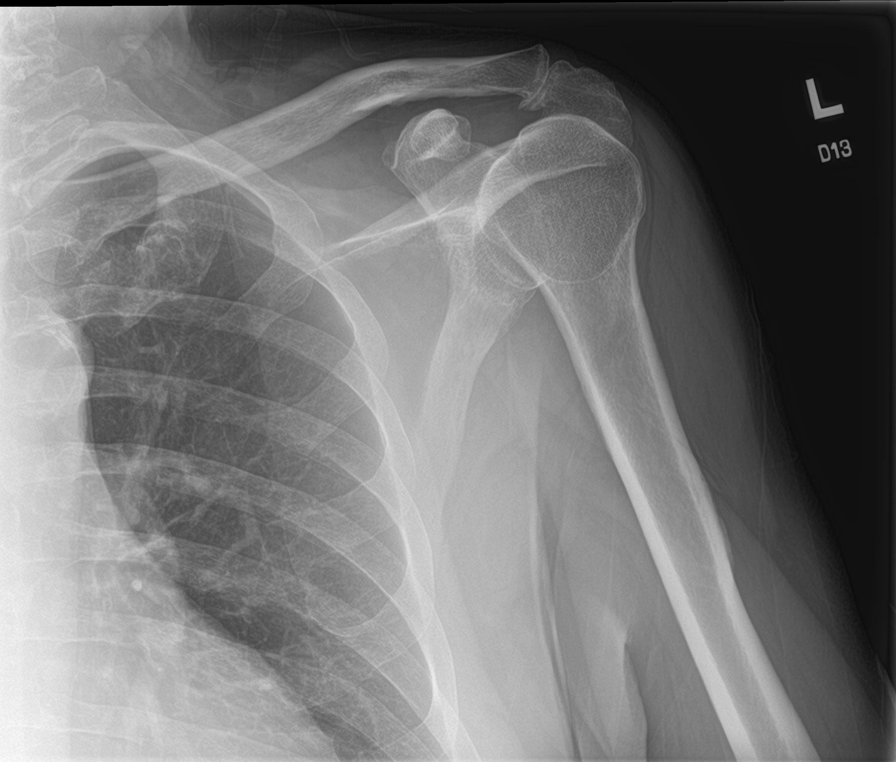

[shoulder ap (2 of 2)]
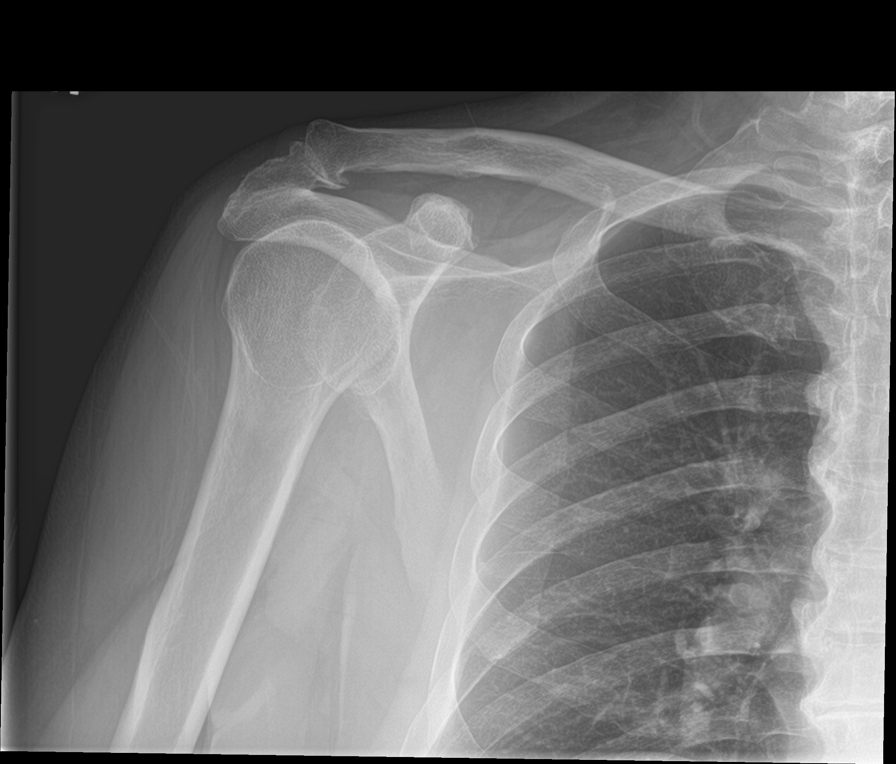

[humerus ap (1 of 2)]
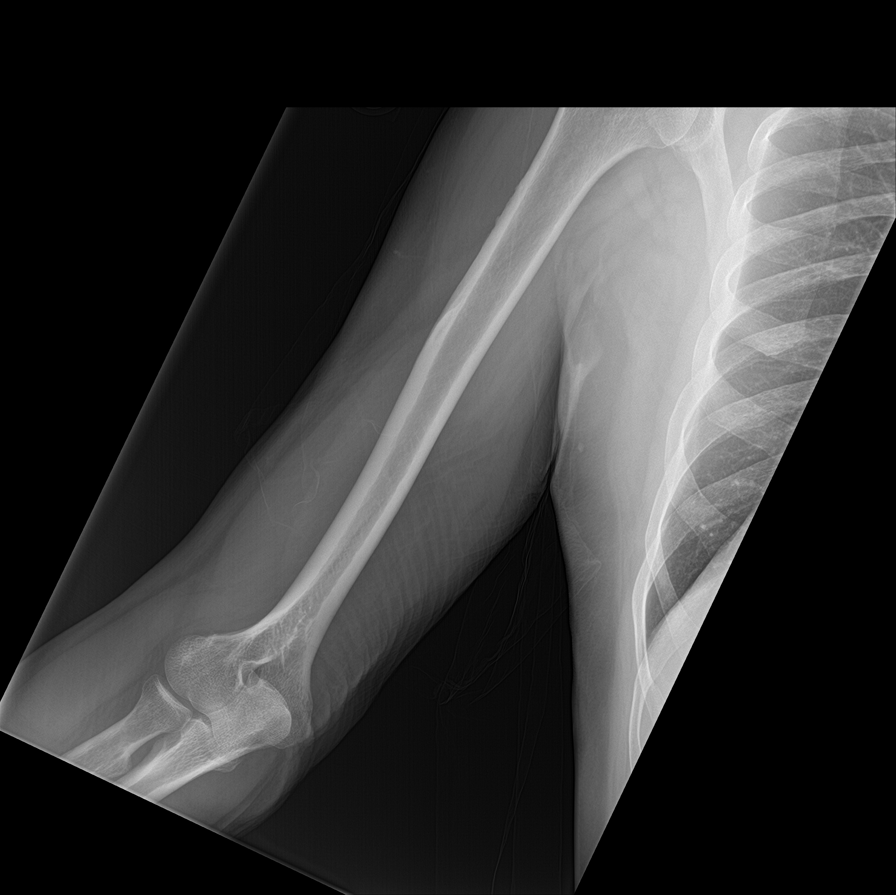

[humerus ap (2 of 2)]
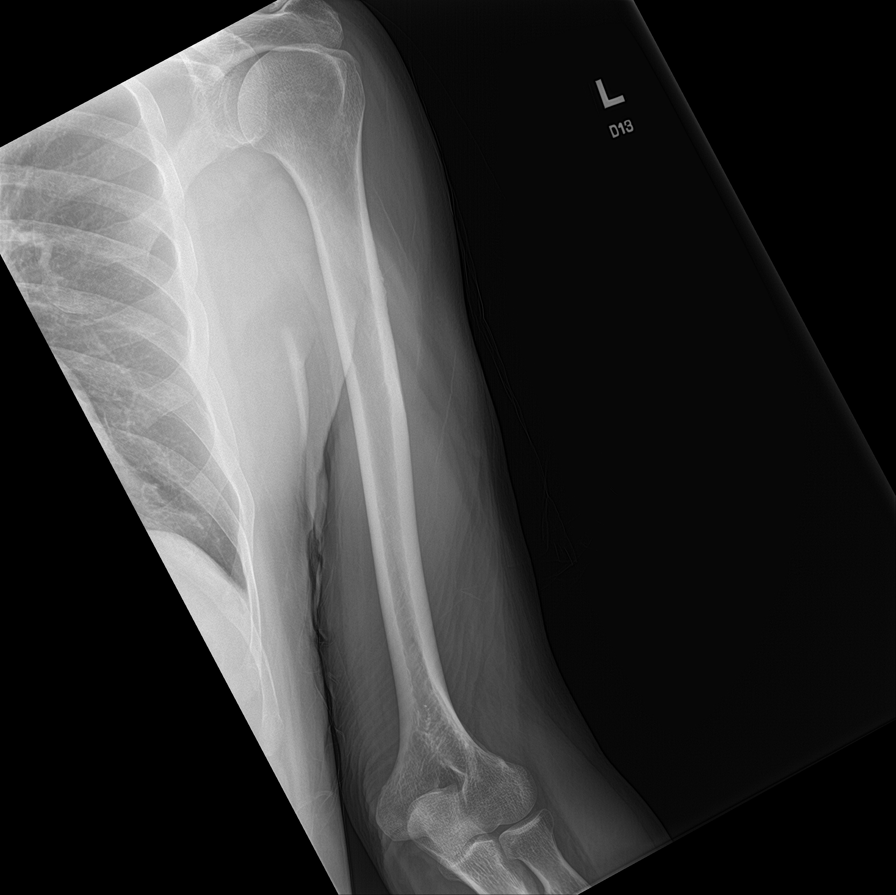

[forearm ap (1 of 2)]
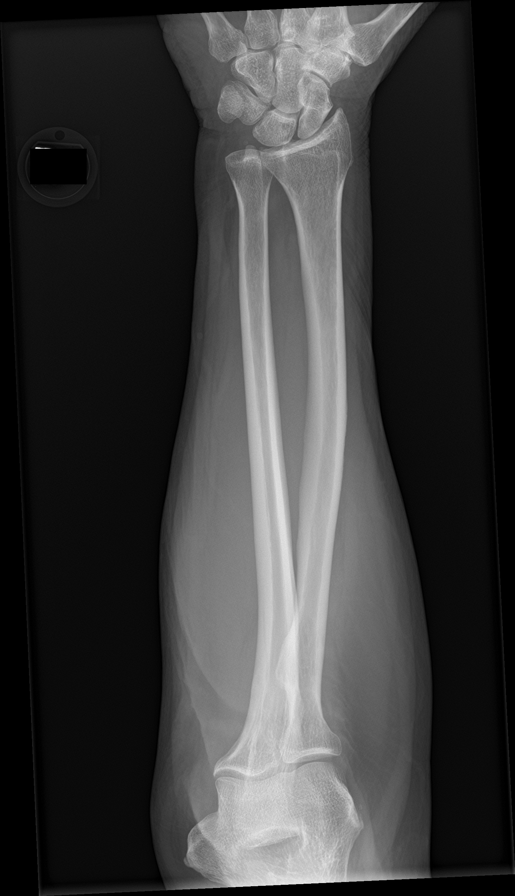

[forearm ap (2 of 2)]
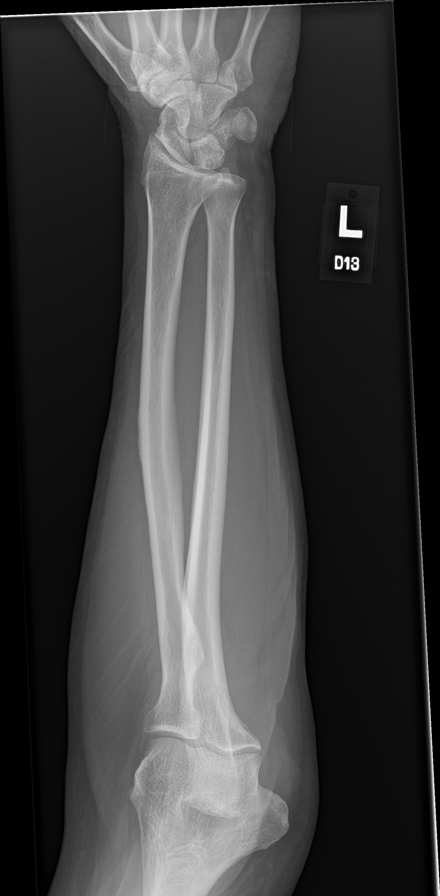

[c-spine ap]
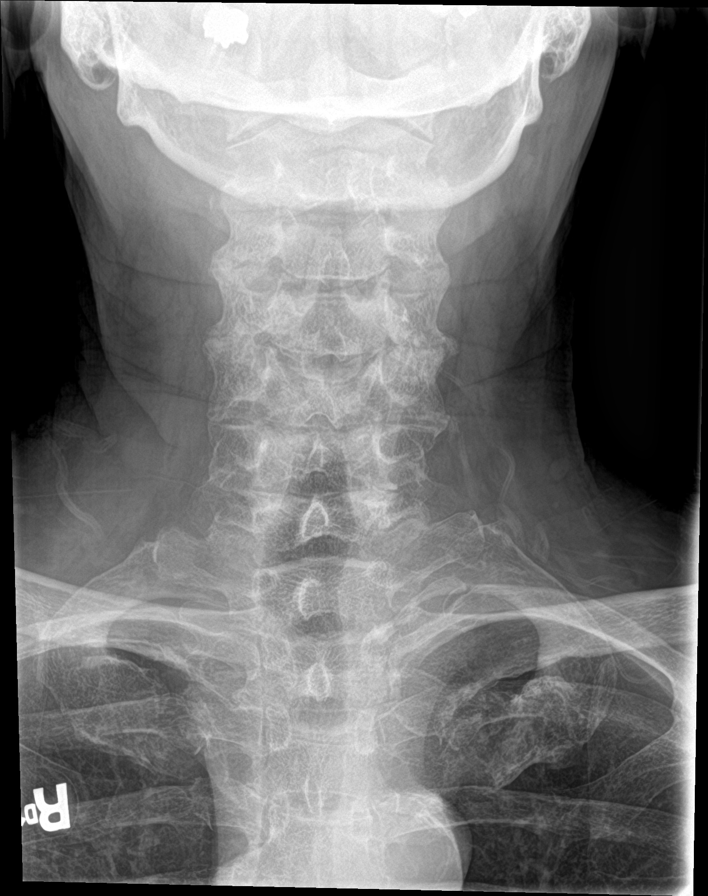

[c-spine lat]
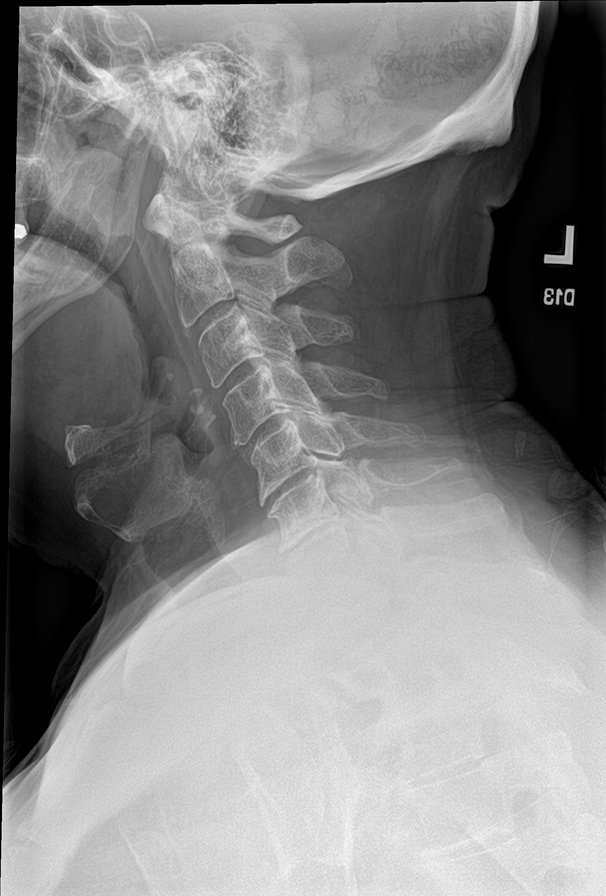

[9 of 10 positions shown; findings below may reference images not displayed]

FINDINGS: Lateral view of the skull is within normal limits.

Upper extremities demonstrate degenerative changes of the
acromioclavicular joints bilaterally. No lytic or sclerotic lesions
are seen.

Cervical, thoracic and lumbar spine demonstrate mild degenerative
change without compression deformity. No paraspinal mass or pedicle
abnormality is seen.

Cardiac shadow is within normal limits. The lungs are clear. Aortic
calcifications are noted. No rib abnormality is seen.

Pelvis shows no lytic or sclerotic lesions. The lower extremities
show no significant lytic or sclerotic lesions.
IMPRESSION: No definitive lytic lesions to correspond with the given clinical
history.

## 2021-10-02 DIAGNOSIS — I129 Hypertensive chronic kidney disease with stage 1 through stage 4 chronic kidney disease, or unspecified chronic kidney disease: Secondary | ICD-10-CM | POA: Diagnosis not present

## 2021-10-02 DIAGNOSIS — R809 Proteinuria, unspecified: Secondary | ICD-10-CM | POA: Diagnosis not present

## 2021-10-02 DIAGNOSIS — N1832 Chronic kidney disease, stage 3b: Secondary | ICD-10-CM | POA: Diagnosis not present

## 2021-10-02 DIAGNOSIS — D472 Monoclonal gammopathy: Secondary | ICD-10-CM | POA: Diagnosis not present

## 2021-10-09 DIAGNOSIS — N182 Chronic kidney disease, stage 2 (mild): Secondary | ICD-10-CM | POA: Diagnosis not present

## 2021-10-09 DIAGNOSIS — R809 Proteinuria, unspecified: Secondary | ICD-10-CM | POA: Diagnosis not present

## 2021-10-09 DIAGNOSIS — I129 Hypertensive chronic kidney disease with stage 1 through stage 4 chronic kidney disease, or unspecified chronic kidney disease: Secondary | ICD-10-CM | POA: Diagnosis not present

## 2021-10-09 DIAGNOSIS — D472 Monoclonal gammopathy: Secondary | ICD-10-CM | POA: Diagnosis not present

## 2021-10-09 DIAGNOSIS — R718 Other abnormality of red blood cells: Secondary | ICD-10-CM | POA: Diagnosis not present

## 2021-10-22 DIAGNOSIS — E039 Hypothyroidism, unspecified: Secondary | ICD-10-CM | POA: Diagnosis not present

## 2021-10-22 DIAGNOSIS — R7301 Impaired fasting glucose: Secondary | ICD-10-CM | POA: Diagnosis not present

## 2021-10-22 DIAGNOSIS — E782 Mixed hyperlipidemia: Secondary | ICD-10-CM | POA: Diagnosis not present

## 2021-10-22 DIAGNOSIS — E559 Vitamin D deficiency, unspecified: Secondary | ICD-10-CM | POA: Diagnosis not present

## 2021-10-27 DIAGNOSIS — Z0001 Encounter for general adult medical examination with abnormal findings: Secondary | ICD-10-CM | POA: Diagnosis not present

## 2021-10-27 DIAGNOSIS — I10 Essential (primary) hypertension: Secondary | ICD-10-CM | POA: Diagnosis not present

## 2021-10-27 DIAGNOSIS — E039 Hypothyroidism, unspecified: Secondary | ICD-10-CM | POA: Diagnosis not present

## 2021-10-27 DIAGNOSIS — J4530 Mild persistent asthma, uncomplicated: Secondary | ICD-10-CM | POA: Diagnosis not present

## 2021-10-27 DIAGNOSIS — R7301 Impaired fasting glucose: Secondary | ICD-10-CM | POA: Diagnosis not present

## 2021-10-27 DIAGNOSIS — N1832 Chronic kidney disease, stage 3b: Secondary | ICD-10-CM | POA: Diagnosis not present

## 2021-10-27 DIAGNOSIS — D472 Monoclonal gammopathy: Secondary | ICD-10-CM | POA: Diagnosis not present

## 2021-10-27 DIAGNOSIS — N401 Enlarged prostate with lower urinary tract symptoms: Secondary | ICD-10-CM | POA: Diagnosis not present

## 2021-10-27 DIAGNOSIS — E782 Mixed hyperlipidemia: Secondary | ICD-10-CM | POA: Diagnosis not present

## 2021-10-27 DIAGNOSIS — E875 Hyperkalemia: Secondary | ICD-10-CM | POA: Diagnosis not present

## 2021-10-27 DIAGNOSIS — E559 Vitamin D deficiency, unspecified: Secondary | ICD-10-CM | POA: Diagnosis not present

## 2022-01-05 DIAGNOSIS — Z20822 Contact with and (suspected) exposure to covid-19: Secondary | ICD-10-CM | POA: Diagnosis not present

## 2022-01-30 DIAGNOSIS — R059 Cough, unspecified: Secondary | ICD-10-CM | POA: Diagnosis not present

## 2022-01-30 DIAGNOSIS — J309 Allergic rhinitis, unspecified: Secondary | ICD-10-CM | POA: Diagnosis not present

## 2022-02-02 DIAGNOSIS — D472 Monoclonal gammopathy: Secondary | ICD-10-CM | POA: Diagnosis not present

## 2022-02-02 DIAGNOSIS — N182 Chronic kidney disease, stage 2 (mild): Secondary | ICD-10-CM | POA: Diagnosis not present

## 2022-02-02 DIAGNOSIS — E559 Vitamin D deficiency, unspecified: Secondary | ICD-10-CM | POA: Diagnosis not present

## 2022-02-02 DIAGNOSIS — I129 Hypertensive chronic kidney disease with stage 1 through stage 4 chronic kidney disease, or unspecified chronic kidney disease: Secondary | ICD-10-CM | POA: Diagnosis not present

## 2022-02-02 DIAGNOSIS — D511 Vitamin B12 deficiency anemia due to selective vitamin B12 malabsorption with proteinuria: Secondary | ICD-10-CM | POA: Diagnosis not present

## 2022-02-02 DIAGNOSIS — R809 Proteinuria, unspecified: Secondary | ICD-10-CM | POA: Diagnosis not present

## 2022-02-10 DIAGNOSIS — N1831 Chronic kidney disease, stage 3a: Secondary | ICD-10-CM | POA: Diagnosis not present

## 2022-02-10 DIAGNOSIS — R809 Proteinuria, unspecified: Secondary | ICD-10-CM | POA: Diagnosis not present

## 2022-02-10 DIAGNOSIS — R718 Other abnormality of red blood cells: Secondary | ICD-10-CM | POA: Diagnosis not present

## 2022-02-10 DIAGNOSIS — I129 Hypertensive chronic kidney disease with stage 1 through stage 4 chronic kidney disease, or unspecified chronic kidney disease: Secondary | ICD-10-CM | POA: Diagnosis not present

## 2022-03-12 ENCOUNTER — Ambulatory Visit (HOSPITAL_COMMUNITY)
Admission: RE | Admit: 2022-03-12 | Discharge: 2022-03-12 | Disposition: A | Discharge status: Home / Self Care | Payer: Medicare Other | Source: Ambulatory Visit | Attending: Physician Assistant | Admitting: Physician Assistant

## 2022-03-12 ENCOUNTER — Other Ambulatory Visit (HOSPITAL_COMMUNITY): Payer: Self-pay

## 2022-03-12 ENCOUNTER — Inpatient Hospital Stay (HOSPITAL_COMMUNITY): Payer: Medicare Other | Attending: Hematology

## 2022-03-12 DIAGNOSIS — D472 Monoclonal gammopathy: Secondary | ICD-10-CM | POA: Insufficient documentation

## 2022-03-12 DIAGNOSIS — Z79899 Other long term (current) drug therapy: Secondary | ICD-10-CM | POA: Insufficient documentation

## 2022-03-12 LAB — CBC WITH DIFFERENTIAL/PLATELET
Abs Immature Granulocytes: 0.02 10*3/uL (ref 0.00–0.07)
Basophils Absolute: 0.1 10*3/uL (ref 0.0–0.1)
Basophils Relative: 1 %
Eosinophils Absolute: 0.7 10*3/uL — ABNORMAL HIGH (ref 0.0–0.5)
Eosinophils Relative: 10 %
HCT: 39.9 % (ref 39.0–52.0)
Hemoglobin: 14 g/dL (ref 13.0–17.0)
Immature Granulocytes: 0 %
Lymphocytes Relative: 35 %
Lymphs Abs: 2.6 10*3/uL (ref 0.7–4.0)
MCH: 36 pg — ABNORMAL HIGH (ref 26.0–34.0)
MCHC: 35.1 g/dL (ref 30.0–36.0)
MCV: 102.6 fL — ABNORMAL HIGH (ref 80.0–100.0)
Monocytes Absolute: 0.7 10*3/uL (ref 0.1–1.0)
Monocytes Relative: 10 %
Neutro Abs: 3.2 10*3/uL (ref 1.7–7.7)
Neutrophils Relative %: 44 %
Platelets: 324 10*3/uL (ref 150–400)
RBC: 3.89 MIL/uL — ABNORMAL LOW (ref 4.22–5.81)
RDW: 13.2 % (ref 11.5–15.5)
WBC: 7.4 10*3/uL (ref 4.0–10.5)
nRBC: 0 % (ref 0.0–0.2)

## 2022-03-12 LAB — COMPREHENSIVE METABOLIC PANEL
ALT: 22 U/L (ref 0–44)
AST: 30 U/L (ref 15–41)
Albumin: 4.3 g/dL (ref 3.5–5.0)
Alkaline Phosphatase: 77 U/L (ref 38–126)
Anion gap: 10 (ref 5–15)
BUN: 14 mg/dL (ref 8–23)
CO2: 30 mmol/L (ref 22–32)
Calcium: 9.1 mg/dL (ref 8.9–10.3)
Chloride: 92 mmol/L — ABNORMAL LOW (ref 98–111)
Creatinine, Ser: 1.33 mg/dL — ABNORMAL HIGH (ref 0.61–1.24)
GFR, Estimated: 54 mL/min — ABNORMAL LOW (ref >60)
Glucose, Bld: 107 mg/dL — ABNORMAL HIGH (ref 70–99)
Potassium: 3.7 mmol/L (ref 3.5–5.1)
Sodium: 132 mmol/L — ABNORMAL LOW (ref 135–145)
Total Bilirubin: 0.8 mg/dL (ref 0.3–1.2)
Total Protein: 7.8 g/dL (ref 6.5–8.1)

## 2022-03-12 LAB — LACTATE DEHYDROGENASE: LDH: 139 U/L (ref 98–192)

## 2022-03-13 LAB — BETA 2 MICROGLOBULIN, SERUM: Beta-2 Microglobulin: 2.3 mg/L (ref 0.6–2.4)

## 2022-03-15 DIAGNOSIS — Z20822 Contact with and (suspected) exposure to covid-19: Secondary | ICD-10-CM | POA: Diagnosis not present

## 2022-03-15 LAB — PROTEIN ELECTROPHORESIS, SERUM
A/G Ratio: 1.6 (ref 0.7–1.7)
Albumin ELP: 4.2 g/dL (ref 2.9–4.4)
Alpha-1-Globulin: 0.2 g/dL (ref 0.0–0.4)
Alpha-2-Globulin: 0.7 g/dL (ref 0.4–1.0)
Beta Globulin: 0.9 g/dL (ref 0.7–1.3)
Gamma Globulin: 0.8 g/dL (ref 0.4–1.8)
Globulin, Total: 2.7 g/dL (ref 2.2–3.9)
Total Protein ELP: 6.9 g/dL (ref 6.0–8.5)

## 2022-03-15 LAB — KAPPA/LAMBDA LIGHT CHAINS
Kappa free light chain: 26.3 mg/L — ABNORMAL HIGH (ref 3.3–19.4)
Kappa, lambda light chain ratio: 2.07 — ABNORMAL HIGH (ref 0.26–1.65)
Lambda free light chains: 12.7 mg/L (ref 5.7–26.3)

## 2022-03-16 LAB — IMMUNOFIXATION ELECTROPHORESIS
IgA: 161 mg/dL (ref 61–437)
IgG (Immunoglobin G), Serum: 1008 mg/dL (ref 603–1613)
IgM (Immunoglobulin M), Srm: 42 mg/dL (ref 15–143)
Total Protein ELP: 6.9 g/dL (ref 6.0–8.5)

## 2022-03-17 DIAGNOSIS — D225 Melanocytic nevi of trunk: Secondary | ICD-10-CM | POA: Diagnosis not present

## 2022-03-17 DIAGNOSIS — L821 Other seborrheic keratosis: Secondary | ICD-10-CM | POA: Diagnosis not present

## 2022-03-17 DIAGNOSIS — L578 Other skin changes due to chronic exposure to nonionizing radiation: Secondary | ICD-10-CM | POA: Diagnosis not present

## 2022-03-17 DIAGNOSIS — L570 Actinic keratosis: Secondary | ICD-10-CM | POA: Diagnosis not present

## 2022-03-17 DIAGNOSIS — D2230 Melanocytic nevi of unspecified part of face: Secondary | ICD-10-CM | POA: Diagnosis not present

## 2022-03-17 DIAGNOSIS — Z85828 Personal history of other malignant neoplasm of skin: Secondary | ICD-10-CM | POA: Diagnosis not present

## 2022-03-17 DIAGNOSIS — Z86018 Personal history of other benign neoplasm: Secondary | ICD-10-CM | POA: Diagnosis not present

## 2022-03-19 ENCOUNTER — Ambulatory Visit (HOSPITAL_COMMUNITY): Payer: Medicare Other | Admitting: Physician Assistant

## 2022-03-23 ENCOUNTER — Ambulatory Visit (HOSPITAL_COMMUNITY): Payer: Medicare Other | Admitting: Physician Assistant

## 2022-03-25 NOTE — Progress Notes (Signed)
? ?[facility] ?[address] ?[place], [place] [address] ? ? ?CLINIC:  ?Medical Oncology/Hematology ? ?PCP:  ?Celene Squibb, MD ?[address] ?[place] [address] ?[phone] ? ? ?REASON FOR VISIT:  ?Follow-up for MGUS ? ?PRIOR THERAPY: None ? ?CURRENT THERAPY: Observation ? ?INTERVAL HISTORY:  ?[name] 81 y.o. male returns for routine follow-up of his questionable history of MGUS.  He was last seen by Tarri Abernethy, PA-C on 03/13/2021. ? ?At today's visit, he reports feeling well.  No recent hospitalizations, surgeries, or changes in baseline health status. ? ?He denies any new bone pain or recent fractures. ?He denies any B symptoms such as fever, chills, night sweats, unintentional weight loss.Marland Kitchen   ?No new neurologic symptoms such as tinnitus, new-onset hearing loss, blurred vision, headache, or dizziness.  Denies any numbness or tingling in hands or feet. ?No thromboembolic events since his last visit.  ?No new masses or lymphadenopathy per his report. ? ?He has 100% energy and 100% appetite. He endorses that he is maintaining a stable weight. ? ? ?REVIEW OF SYSTEMS:  ?Review of Systems  ?Constitutional:  Negative for appetite change, chills, diaphoresis, fatigue, fever and unexpected weight change.  ?HENT:   Negative for lump/mass and nosebleeds.   ?Eyes:  Negative for eye problems.  ?Respiratory:  Negative for cough, hemoptysis and shortness of breath.   ?Cardiovascular:  Negative for chest pain, leg swelling and palpitations.  ?Gastrointestinal:  Negative for abdominal pain, blood in stool, constipation, diarrhea, nausea and vomiting.  ?Genitourinary:  Positive for frequency. Negative for hematuria.   ?Skin: Negative.   ?Neurological:  Positive for dizziness (occasional vertigo). Negative for headaches and light-headedness.  ?Hematological:  Does not bruise/bleed easily.   ? ? ?PAST MEDICAL/SURGICAL HISTORY:  ?Past Medical History:  ?Diagnosis Date  ? Asthma   ? Blood transfusion abn  reaction or complication, no procedure mishap 1979  ? reaction due to wrong blood type given  ? GERD (gastroesophageal reflux disease)   ? occasional  ? H/O: pneumonia 02/27/2015  ? Hyperlipidemia   ? Hypertension   ? Hypothyroidism   ? MGUS (monoclonal gammopathy of unknown significance)   ? Myocardial infarction [facility])   ? hx of abnormal ekg showed prior myocardial infarction  ? Renal disorder   ? Vitamin B 12 deficiency 09/30/2016  ? ?Past Surgical History:  ?Procedure Laterality Date  ? APPENDECTOMY  1963  ? CHOLECYSTECTOMY  1979  ? COLONOSCOPY  03/28/2008  ? PJK:DTOIZT rectum; left-sided transverse diverticula diminutive polyp; descending colon. Adenomas.  ? COLONOSCOPY N/A 06/07/2013  ? Procedure: COLONOSCOPY;  Surgeon: Daneil Dolin, MD;  [facility];  Service: Endoscopy;  Laterality: N/A;  9:45  ? COLONOSCOPY WITH PROPOFOL N/A 06/01/2021  ? Procedure: COLONOSCOPY WITH PROPOFOL;  Surgeon: Daneil Dolin, MD;  [facility];  Service: Endoscopy;  Laterality: N/A;  12:15pm  ? ESOPHAGOGASTRODUODENOSCOPY N/A 02/25/2021  ? Procedure: ESOPHAGOGASTRODUODENOSCOPY (EGD);  Surgeon: Daneil Dolin, MD;  [facility];  Service: Endoscopy;  Laterality: N/A;  pm appt  ? HERNIA REPAIR  2005  ? LUMBAR LAMINECTOMY/DECOMPRESSION MICRODISCECTOMY  07/26/2012  ? Procedure: LUMBAR LAMINECTOMY/DECOMPRESSION MICRODISCECTOMY;  Surgeon: Johnn Hai, MD;  [facility]: [facility];  Service: Orthopedics;  Laterality: N/A;  Lumbar Decompression L4-L5  ? MALONEY DILATION N/A 02/25/2021  ? Procedure: MALONEY DILATION;  Surgeon: Daneil Dolin, MD;  [facility];  Service: Endoscopy;  Laterality: N/A;  ? POLYPECTOMY  06/01/2021  ?  Procedure: POLYPECTOMY INTESTINAL;  Surgeon: Daneil Dolin, MD;  [facility];  Service: Endoscopy;;  ? STOMACH SURGERY  2003  ? exploratory surgery, fatty tumors with obstruction?  ? TONSILLECTOMY  1962  ? ? ? ?SOCIAL HISTORY:  ?Social History  ? ?Socioeconomic History   ? Marital status: Married  ?  Spouse name: Not on file  ? Number of children: Not on file  ? Years of education: Not on file  ? Highest education level: Not on file  ?Occupational History  ? Not on file  ?Tobacco Use  ? Smoking status: Never  ? Smokeless tobacco: Never  ?Vaping Use  ? Vaping Use: Never used  ?Substance and Sexual Activity  ? Alcohol use: Yes  ?  Comment: occasional  ? Drug use: No  ? Sexual activity: Never  ?Other Topics Concern  ? Not on file  ?Social History Narrative  ? Not on file  ? ?Social Determinants of Health  ? ?Financial Resource Strain: Not on file  ?Food Insecurity: Not on file  ?Transportation Needs: Not on file  ?Physical Activity: Not on file  ?Stress: Not on file  ?Social Connections: Not on file  ?Intimate Partner Violence: Not on file  ? ? ?FAMILY HISTORY:  ?Family History  ?Problem Relation Age of Onset  ? Thyroid disease Mother   ? Heart disease Mother   ? Hypertension Mother   ? Clotting disorder Father   ? Hypertension Father   ? Cancer Sister   ? Hypertension Sister   ? Colon cancer Neg Hx   ? ? ?CURRENT MEDICATIONS:  ?Outpatient Encounter Medications as of 03/26/2022  ?Medication Sig  ? amLODipine (NORVASC) 5 MG tablet Take 5 mg by mouth daily.  ? aspirin EC 81 MG tablet Take 81 mg by mouth daily.  ? carboxymethylcellulose (REFRESH PLUS) 0.5 % SOLN Place 1 drop into both eyes 3 (three) times daily as needed (dry eyes).  ? Cholecalciferol (VITAMIN D3 MAXIMUM STRENGTH) 125 MCG (5000 UT) capsule Take 5,000 Units by mouth daily.  ? Cyanocobalamin (VITAMIN B-12) 5000 MCG TBDP Take 5,000 mcg by mouth daily.  ? Fluticasone-Salmeterol (WIXELA INHUB IN) Inhale 1 puff into the lungs daily as needed (Allergies and Asthma).  ? guaiFENesin (MUCINEX) 600 MG 12 hr tablet Take 600 mg by mouth every other day.  ? levalbuterol (XOPENEX) 1.25 MG/3ML nebulizer solution Inhale 1.25 mg into the lungs 3 (three) times daily as needed for wheezing.  ? levothyroxine (SYNTHROID, LEVOTHROID) 75 MCG  tablet Take 75 mcg by mouth daily before breakfast.  ? pantoprazole (PROTONIX) 40 MG tablet Take 1 tablet by mouth daily as needed (Acid reflex).  ? polyethylene glycol-electrolytes (NULYTELY) 420 g solution As directed  ? simvastatin (ZOCOR) 40 MG tablet Take 40 mg by mouth at bedtime.  ? tamsulosin (FLOMAX) 0.4 MG CAPS capsule Take 0.4 mg by mouth daily.  ? triamterene-hydrochlorothiazide (DYAZIDE) 37.5-25 MG capsule Take 1 capsule by mouth daily.  ? zinc gluconate 50 MG tablet Take 50 mg by mouth every other day.  ? ?No facility-administered encounter medications on file as of 03/26/2022.  ? ? ?ALLERGIES:  ?Allergies  ?Allergen Reactions  ? Meperidine And Related Other (See Comments)  ?  Makes him feel like he is having a heart attack  ? Sulfa Antibiotics Rash  ? ? ? ?PHYSICAL EXAM:  ?ECOG PERFORMANCE STATUS: 0 - Asymptomatic ? ?There were no vitals filed for this visit. ?There were no vitals filed for this visit. ?Physical Exam ?Constitutional:   ?  Appearance: Normal appearance. He is obese.  ?HENT:  ?   Head: Normocephalic and atraumatic.  ?   Mouth/Throat:  ?   Mouth: Mucous membranes are moist.  ?Eyes:  ?   Extraocular Movements: Extraocular movements intact.  ?   Pupils: Pupils are equal, round, and reactive to light.  ?Cardiovascular:  ?   Rate and Rhythm: Normal rate and regular rhythm.  ?   Pulses: Normal pulses.  ?   Heart sounds: Normal heart sounds.  ?Pulmonary:  ?   Effort: Pulmonary effort is normal.  ?   Breath sounds: Normal breath sounds.  ?Abdominal:  ?   General: Bowel sounds are normal.  ?   Palpations: Abdomen is soft.  ?   Tenderness: There is no abdominal tenderness.  ?Musculoskeletal:     ?   General: No swelling.  ?   Right lower leg: No edema.  ?   Left lower leg: No edema.  ?Lymphadenopathy:  ?   Cervical: No cervical adenopathy.  ?Skin: ?   General: Skin is warm and dry.  ?Neurological:  ?   General: No focal deficit present.  ?   Mental Status: He is alert and oriented to person,  place, and time.  ?Psychiatric:     ?   Mood and Affect: Mood normal.     ?   Behavior: Behavior normal.  ? ? ? ?LABORATORY DATA:  ?I have reviewed the labs as listed.  ?CBC ?   ?Component Value Date/Time  ?

## 2022-03-26 ENCOUNTER — Inpatient Hospital Stay (HOSPITAL_COMMUNITY): Payer: Medicare Other | Attending: Hematology | Admitting: Physician Assistant

## 2022-03-26 VITALS — BP 139/67 | HR 65 | Temp 97.7°F | Resp 17 | Ht 70.0 in | Wt 213.4 lb

## 2022-03-26 DIAGNOSIS — I129 Hypertensive chronic kidney disease with stage 1 through stage 4 chronic kidney disease, or unspecified chronic kidney disease: Secondary | ICD-10-CM | POA: Diagnosis not present

## 2022-03-26 DIAGNOSIS — R768 Other specified abnormal immunological findings in serum: Secondary | ICD-10-CM | POA: Insufficient documentation

## 2022-03-26 DIAGNOSIS — D472 Monoclonal gammopathy: Secondary | ICD-10-CM | POA: Diagnosis present

## 2022-03-26 DIAGNOSIS — Z79899 Other long term (current) drug therapy: Secondary | ICD-10-CM | POA: Diagnosis not present

## 2022-03-26 DIAGNOSIS — N183 Chronic kidney disease, stage 3 unspecified: Secondary | ICD-10-CM | POA: Diagnosis not present

## 2022-03-26 NOTE — Patient Instructions (Signed)
[place] at [facility] ?Discharge Instructions ? ?You were seen today by Tarri Abernethy PA-C for your history of abnormal labs. ? ?Your labs have been normal for the past year, and you do not show any signs of MGUS or myeloma at this time. ? ?We will discharge you to your primary care doctor for ongoing follow-up.  If you develop any abnormalities in the future, you can be referred back to [place] on an as-needed basis and we will happily take care of you again! ? ? ?Thank you for choosing [place] at [facility] to provide your oncology and hematology care.  To afford each patient quality time with our provider, please arrive at least 15 minutes before your scheduled appointment time.  ? ?If you have a lab appointment with the [place] please come in thru the Main Entrance and check in at the main information desk. ? ?You need to re-schedule your appointment should you arrive 10 or more minutes late.  We strive to give you quality time with our providers, and arriving late affects you and other patients whose appointments are after yours.  Also, if you no show three or more times for appointments you may be dismissed from the clinic at the providers discretion.     ?Again, thank you for choosing [facility].  Our hope is that these requests will decrease the amount of time that you wait before being seen by our physicians.       ?_____________________________________________________________ ? ?Should you have questions after your visit to [facility], please contact our office at [phone] and follow the prompts.  Our office hours are 8:00 a.m. and 4:30 p.m. Monday - Friday.  Please note that voicemails left after 4:00 p.m. may not be returned until the following business day.  We are closed weekends and major holidays.  You do have access to a nurse 24-7, just call the main number to the clinic [phone] and do not press any  options, hold on the line and a nurse will answer the phone.   ? ?For prescription refill requests, have your pharmacy contact our office and allow 72 hours.   ? ?Due to Covid, you will need to wear a mask upon entering the hospital. If you do not have a mask, a mask will be given to you at the Main Entrance upon arrival. For doctor visits, patients may have 1 support person age 18 or older with them. For treatment visits, patients can not have anyone with them due to social distancing guidelines and our immunocompromised population.  ? ? ? ?

## 2022-05-27 ENCOUNTER — Encounter: Payer: Self-pay | Admitting: Internal Medicine

## 2022-06-02 DIAGNOSIS — R809 Proteinuria, unspecified: Secondary | ICD-10-CM | POA: Diagnosis not present

## 2022-06-02 DIAGNOSIS — N1831 Chronic kidney disease, stage 3a: Secondary | ICD-10-CM | POA: Diagnosis not present

## 2022-06-02 DIAGNOSIS — R718 Other abnormality of red blood cells: Secondary | ICD-10-CM | POA: Diagnosis not present

## 2022-06-02 DIAGNOSIS — I129 Hypertensive chronic kidney disease with stage 1 through stage 4 chronic kidney disease, or unspecified chronic kidney disease: Secondary | ICD-10-CM | POA: Diagnosis not present

## 2022-06-09 DIAGNOSIS — N1831 Chronic kidney disease, stage 3a: Secondary | ICD-10-CM | POA: Diagnosis not present

## 2022-06-09 DIAGNOSIS — R809 Proteinuria, unspecified: Secondary | ICD-10-CM | POA: Diagnosis not present

## 2022-06-09 DIAGNOSIS — E039 Hypothyroidism, unspecified: Secondary | ICD-10-CM | POA: Diagnosis not present

## 2022-06-09 DIAGNOSIS — E782 Mixed hyperlipidemia: Secondary | ICD-10-CM | POA: Diagnosis not present

## 2022-06-09 DIAGNOSIS — E610 Copper deficiency: Secondary | ICD-10-CM | POA: Diagnosis not present

## 2022-06-09 DIAGNOSIS — I129 Hypertensive chronic kidney disease with stage 1 through stage 4 chronic kidney disease, or unspecified chronic kidney disease: Secondary | ICD-10-CM | POA: Diagnosis not present

## 2022-06-09 DIAGNOSIS — R7301 Impaired fasting glucose: Secondary | ICD-10-CM | POA: Diagnosis not present

## 2022-06-09 DIAGNOSIS — D7589 Other specified diseases of blood and blood-forming organs: Secondary | ICD-10-CM | POA: Diagnosis not present

## 2022-06-09 DIAGNOSIS — D472 Monoclonal gammopathy: Secondary | ICD-10-CM | POA: Diagnosis not present

## 2022-06-16 DIAGNOSIS — J4530 Mild persistent asthma, uncomplicated: Secondary | ICD-10-CM | POA: Diagnosis not present

## 2022-06-16 DIAGNOSIS — R7301 Impaired fasting glucose: Secondary | ICD-10-CM | POA: Diagnosis not present

## 2022-06-16 DIAGNOSIS — E039 Hypothyroidism, unspecified: Secondary | ICD-10-CM | POA: Diagnosis not present

## 2022-06-16 DIAGNOSIS — E782 Mixed hyperlipidemia: Secondary | ICD-10-CM | POA: Diagnosis not present

## 2022-06-16 DIAGNOSIS — N1832 Chronic kidney disease, stage 3b: Secondary | ICD-10-CM | POA: Diagnosis not present

## 2022-06-16 DIAGNOSIS — N401 Enlarged prostate with lower urinary tract symptoms: Secondary | ICD-10-CM | POA: Diagnosis not present

## 2022-06-16 DIAGNOSIS — D472 Monoclonal gammopathy: Secondary | ICD-10-CM | POA: Diagnosis not present

## 2022-06-16 DIAGNOSIS — G629 Polyneuropathy, unspecified: Secondary | ICD-10-CM | POA: Diagnosis not present

## 2022-06-16 DIAGNOSIS — E559 Vitamin D deficiency, unspecified: Secondary | ICD-10-CM | POA: Diagnosis not present

## 2022-06-16 DIAGNOSIS — E875 Hyperkalemia: Secondary | ICD-10-CM | POA: Diagnosis not present

## 2022-06-16 DIAGNOSIS — I10 Essential (primary) hypertension: Secondary | ICD-10-CM | POA: Diagnosis not present

## 2022-06-29 ENCOUNTER — Ambulatory Visit (INDEPENDENT_AMBULATORY_CARE_PROVIDER_SITE_OTHER): Payer: Medicare Other | Admitting: Internal Medicine

## 2022-06-29 ENCOUNTER — Encounter: Payer: Self-pay | Admitting: Internal Medicine

## 2022-06-29 VITALS — BP 136/75 | HR 84 | Temp 97.5°F | Ht 70.5 in | Wt 211.4 lb

## 2022-06-29 DIAGNOSIS — R1319 Other dysphagia: Secondary | ICD-10-CM | POA: Diagnosis not present

## 2022-06-29 DIAGNOSIS — Z86010 Personal history of colonic polyps: Secondary | ICD-10-CM | POA: Diagnosis not present

## 2022-06-29 DIAGNOSIS — K219 Gastro-esophageal reflux disease without esophagitis: Secondary | ICD-10-CM | POA: Diagnosis not present

## 2022-06-29 NOTE — Progress Notes (Signed)
Primary Care Physician:  Celene Squibb, MD Primary Gastroenterologist:  Dr.   Pre-Procedure History & Physical: HPI:  [name] is a 81 y.o. male here for follow-up of GERD peptic stricture  - status post dilation last year.  Doing well without dysphagia.  Reflux well controlled on Protonix 40 mg daily. Abdominal CT negative recently.  Couple small adenomas removed last colonoscopy 2022-no future colonoscopy recommended.   Past Medical History:  Diagnosis Date   Asthma    Blood transfusion abn reaction or complication, no procedure mishap 1979   reaction due to wrong blood type given   GERD (gastroesophageal reflux disease)    occasional   H/O: pneumonia 02/27/2015   Hyperlipidemia    Hypertension    Hypothyroidism    MGUS (monoclonal gammopathy of unknown significance)    Myocardial infarction (HCC)    hx of abnormal ekg showed prior myocardial infarction   Renal disorder    Vitamin B 12 deficiency 09/30/2016    Past Surgical History:  Procedure Laterality Date   [place]   COLONOSCOPY  03/28/2008   VEL:FYBOFB rectum; left-sided transverse diverticula diminutive polyp; descending colon. Adenomas.   COLONOSCOPY N/A 06/07/2013   Procedure: COLONOSCOPY;  Surgeon: Daneil Dolin, MD;  [facility];  Service: Endoscopy;  Laterality: N/A;  9:45   COLONOSCOPY WITH PROPOFOL N/A 06/01/2021   Procedure: COLONOSCOPY WITH PROPOFOL;  Surgeon: Daneil Dolin, MD;  [facility];  Service: Endoscopy;  Laterality: N/A;  12:15pm   ESOPHAGOGASTRODUODENOSCOPY N/A 02/25/2021   Procedure: ESOPHAGOGASTRODUODENOSCOPY (EGD);  Surgeon: Daneil Dolin, MD;  [facility];  Service: Endoscopy;  Laterality: N/A;  pm appt   HERNIA REPAIR  2005   LUMBAR LAMINECTOMY/DECOMPRESSION MICRODISCECTOMY  07/26/2012   Procedure: LUMBAR LAMINECTOMY/DECOMPRESSION MICRODISCECTOMY;  Surgeon: Johnn Hai, MD;  [facility];  Service:  Orthopedics;  Laterality: N/A;  Lumbar Decompression L4-L5   MALONEY DILATION N/A 02/25/2021   Procedure: Venia Minks DILATION;  Surgeon: Daneil Dolin, MD;  [facility];  Service: Endoscopy;  Laterality: N/A;   POLYPECTOMY  06/01/2021   Procedure: POLYPECTOMY INTESTINAL;  Surgeon: Daneil Dolin, MD;  [facility];  Service: Endoscopy;;   STOMACH SURGERY  2003   exploratory surgery, fatty tumors with obstruction?   TONSILLECTOMY  1962    Prior to Admission medications   Medication Sig Start Date End Date Taking? Authorizing Provider  amLODipine (NORVASC) 5 MG tablet Take 5 mg by mouth daily. 04/22/17  Yes [provider]  aspirin EC 81 MG tablet Take 81 mg by mouth daily.   Yes [provider]  Calcium Carbonate-Vitamin D (OYSTER SHELL CALCIUM/D) 500-5 MG-MCG TABS Take by mouth.   Yes [provider]  carboxymethylcellulose (REFRESH PLUS) 0.5 % SOLN Place 1 drop into both eyes 3 (three) times daily as needed (dry eyes).   Yes [provider]  cetirizine (ZYRTEC) 10 MG tablet cetirizine 10 mg tablet  Take 1 tablet every day by oral route.   Yes [provider]  Cholecalciferol (VITAMIN D3 MAXIMUM STRENGTH) 125 MCG (5000 UT) capsule Take 5,000 Units by mouth daily.   Yes [provider]  COPPER PO Take by mouth. 06/09/22 06/09/23 Yes [provider]  Cyanocobalamin (VITAMIN B-12) 5000 MCG TBDP Take 5,000 mcg by mouth daily.   Yes [provider]  fluticasone (FLONASE) 50 MCG/ACT nasal spray fluticasone propionate 50 mcg/actuation nasal spray,suspension  Yes [provider]  Fluticasone-Salmeterol (WIXELA INHUB IN) Inhale 1 puff into the lungs daily as needed (Allergies and Asthma).   Yes [provider]  folic acid (FOLVITE) 409 MCG tablet 1 tablet   Yes [provider]  guaiFENesin (MUCINEX) 600 MG 12 hr tablet Take 600 mg by mouth every other day.   Yes [provider]   levalbuterol (XOPENEX) 1.25 MG/3ML nebulizer solution Inhale 1.25 mg into the lungs 3 (three) times daily as needed for wheezing. 04/24/13  Yes [provider]  levothyroxine (SYNTHROID, LEVOTHROID) 75 MCG tablet Take 75 mcg by mouth daily before breakfast.   Yes [provider]  pantoprazole (PROTONIX) 40 MG tablet Take 1 tablet by mouth daily as needed (Acid reflex). 02/26/21  Yes [provider]  simvastatin (ZOCOR) 40 MG tablet Take 40 mg by mouth at bedtime.   Yes [provider]  tamsulosin (FLOMAX) 0.4 MG CAPS capsule Take 0.4 mg by mouth daily.   Yes [provider]  triamterene-hydrochlorothiazide (DYAZIDE) 37.5-25 MG capsule Take 1 capsule by mouth daily.   Yes [provider]  zinc gluconate 50 MG tablet Take 50 mg by mouth every other day.   Yes [provider]    Allergies as of 06/29/2022 - Review Complete 06/29/2022  Allergen Reaction Noted   Meperidine and related Other (See Comments) 07/18/2012   Sulfa antibiotics Rash 07/18/2012    Family History  Problem Relation Age of Onset   Thyroid disease Mother    Heart disease Mother    Hypertension Mother    Clotting disorder Father    Hypertension Father    Cancer Sister    Hypertension Sister    Colon cancer Neg Hx     Social History   Socioeconomic History   Marital status: Married    Spouse name: Not on file   Number of children: Not on file   Years of education: Not on file   Highest education level: Not on file  Occupational History   Not on file  Tobacco Use   Smoking status: Never   Smokeless tobacco: Never  Vaping Use   Vaping Use: Never used  Substance and Sexual Activity   Alcohol use: Yes    Comment: occasional   Drug use: No   Sexual activity: Never  Other Topics Concern   Not on file  Social History Narrative   Not on file   Social Determinants of Health   Financial Resource Strain: Low Risk  (03/13/2021)   Overall Financial  Resource Strain (CARDIA)    Difficulty of Paying Living Expenses: Not hard at all  Food Insecurity: No Food Insecurity (03/13/2021)   Hunger Vital Sign    Worried About Running Out of Food in the Last Year: Never true    [place] in the Last Year: Never true  Transportation Needs: No Transportation Needs (03/13/2021)   PRAPARE - Hydrologist (Medical): No    Lack of Transportation (Non-Medical): No  Physical Activity: Sufficiently Active (03/13/2021)   Exercise Vital Sign    Days of Exercise per Week: 7 days    Minutes of Exercise per Session: 60 min  Stress: No Stress Concern Present (03/13/2021)   [place]    Feeling of Stress : Not at all  Social Connections: Moderately Isolated (03/13/2021)   Social Connection and Isolation Panel [NHANES]    Frequency of Communication with Friends  and Family: More than three times a week    Frequency of Social Gatherings with Friends and Family: Once a week    Attends Religious Services: Never    Marine scientist or Organizations: No    Attends Archivist Meetings: Never    Marital Status: Married  Human resources officer Violence: Not At Risk (03/13/2021)   Humiliation, Afraid, Rape, and Kick questionnaire    Fear of Current or Ex-Partner: No    Emotionally Abused: No    Physically Abused: No    Sexually Abused: No    Review of Systems: See HPI, otherwise negative ROS  Physical Exam: BP 136/75 (BP Location: Left Arm, Patient Position: Sitting, Cuff Size: Normal)   Pulse 84   Temp (!) 97.5 F (36.4 C) (Temporal)   Ht 5' 10.5" (1.791 m)   Wt 211 lb 6.4 oz (95.9 kg)   SpO2 96%   BMI 29.90 kg/m   General:   Alert,  Well-developed, well-nourished, pleasant and cooperative in NAD  Impression/Plan: 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  Currently, doing very well on Protonix 40 mg daily without any significant recurrent dysphagia.  We talked about the chronic nature of GERD and the potential for recurrent stricture formation.  He may or may not need another dilation in the future.  No further surveillance/screening colonoscopy is warranted due to age and prior findings.  Recommendations:  Continue taking your Protonix 40 mg each morning before breakfast  As discussed, future colonoscopy is not needed.  We will plan to see you back in the office in 1 year.    Notice: This dictation was prepared with Dragon dictation along with smaller phrase technology. Any transcriptional errors that result from this process are unintentional and may not be corrected upon review.

## 2022-06-29 NOTE — Patient Instructions (Signed)
It was great seeing you today as always!  Continue taking your Protonix 40 mg each morning before breakfast  As discussed, future colonoscopy is not needed.  We will plan to see you back in the office in 1 year.

## 2022-07-09 DIAGNOSIS — D2239 Melanocytic nevi of other parts of face: Secondary | ICD-10-CM | POA: Diagnosis not present

## 2022-07-09 DIAGNOSIS — L570 Actinic keratosis: Secondary | ICD-10-CM | POA: Diagnosis not present

## 2022-09-15 DIAGNOSIS — J069 Acute upper respiratory infection, unspecified: Secondary | ICD-10-CM | POA: Diagnosis not present

## 2022-09-28 DIAGNOSIS — I129 Hypertensive chronic kidney disease with stage 1 through stage 4 chronic kidney disease, or unspecified chronic kidney disease: Secondary | ICD-10-CM | POA: Diagnosis not present

## 2022-09-28 DIAGNOSIS — D7589 Other specified diseases of blood and blood-forming organs: Secondary | ICD-10-CM | POA: Diagnosis not present

## 2022-09-28 DIAGNOSIS — E610 Copper deficiency: Secondary | ICD-10-CM | POA: Diagnosis not present

## 2022-09-28 DIAGNOSIS — R809 Proteinuria, unspecified: Secondary | ICD-10-CM | POA: Diagnosis not present

## 2022-09-28 DIAGNOSIS — D472 Monoclonal gammopathy: Secondary | ICD-10-CM | POA: Diagnosis not present

## 2022-09-28 DIAGNOSIS — N1831 Chronic kidney disease, stage 3a: Secondary | ICD-10-CM | POA: Diagnosis not present

## 2022-10-28 DIAGNOSIS — Z0000 Encounter for general adult medical examination without abnormal findings: Secondary | ICD-10-CM | POA: Diagnosis not present

## 2022-11-29 DIAGNOSIS — E873 Alkalosis: Secondary | ICD-10-CM | POA: Diagnosis not present

## 2022-11-29 DIAGNOSIS — R809 Proteinuria, unspecified: Secondary | ICD-10-CM | POA: Diagnosis not present

## 2022-11-29 DIAGNOSIS — E876 Hypokalemia: Secondary | ICD-10-CM | POA: Diagnosis not present

## 2022-11-29 DIAGNOSIS — D472 Monoclonal gammopathy: Secondary | ICD-10-CM | POA: Diagnosis not present

## 2022-11-29 DIAGNOSIS — N1831 Chronic kidney disease, stage 3a: Secondary | ICD-10-CM | POA: Diagnosis not present

## 2022-11-29 DIAGNOSIS — I129 Hypertensive chronic kidney disease with stage 1 through stage 4 chronic kidney disease, or unspecified chronic kidney disease: Secondary | ICD-10-CM | POA: Diagnosis not present

## 2022-12-06 DIAGNOSIS — N1831 Chronic kidney disease, stage 3a: Secondary | ICD-10-CM | POA: Diagnosis not present

## 2022-12-06 DIAGNOSIS — I129 Hypertensive chronic kidney disease with stage 1 through stage 4 chronic kidney disease, or unspecified chronic kidney disease: Secondary | ICD-10-CM | POA: Diagnosis not present

## 2022-12-06 DIAGNOSIS — D7589 Other specified diseases of blood and blood-forming organs: Secondary | ICD-10-CM | POA: Diagnosis not present

## 2022-12-06 DIAGNOSIS — D472 Monoclonal gammopathy: Secondary | ICD-10-CM | POA: Diagnosis not present

## 2022-12-06 DIAGNOSIS — E871 Hypo-osmolality and hyponatremia: Secondary | ICD-10-CM | POA: Diagnosis not present

## 2022-12-06 DIAGNOSIS — R809 Proteinuria, unspecified: Secondary | ICD-10-CM | POA: Diagnosis not present

## 2022-12-06 DIAGNOSIS — E610 Copper deficiency: Secondary | ICD-10-CM | POA: Diagnosis not present

## 2022-12-13 DIAGNOSIS — E039 Hypothyroidism, unspecified: Secondary | ICD-10-CM | POA: Diagnosis not present

## 2022-12-13 DIAGNOSIS — R7301 Impaired fasting glucose: Secondary | ICD-10-CM | POA: Diagnosis not present

## 2022-12-13 DIAGNOSIS — E782 Mixed hyperlipidemia: Secondary | ICD-10-CM | POA: Diagnosis not present

## 2022-12-16 DIAGNOSIS — E039 Hypothyroidism, unspecified: Secondary | ICD-10-CM | POA: Diagnosis not present

## 2022-12-16 DIAGNOSIS — D472 Monoclonal gammopathy: Secondary | ICD-10-CM | POA: Diagnosis not present

## 2022-12-16 DIAGNOSIS — I10 Essential (primary) hypertension: Secondary | ICD-10-CM | POA: Diagnosis not present

## 2022-12-16 DIAGNOSIS — N401 Enlarged prostate with lower urinary tract symptoms: Secondary | ICD-10-CM | POA: Diagnosis not present

## 2022-12-16 DIAGNOSIS — E875 Hyperkalemia: Secondary | ICD-10-CM | POA: Diagnosis not present

## 2022-12-16 DIAGNOSIS — E559 Vitamin D deficiency, unspecified: Secondary | ICD-10-CM | POA: Diagnosis not present

## 2022-12-16 DIAGNOSIS — N1832 Chronic kidney disease, stage 3b: Secondary | ICD-10-CM | POA: Diagnosis not present

## 2022-12-16 DIAGNOSIS — J4530 Mild persistent asthma, uncomplicated: Secondary | ICD-10-CM | POA: Diagnosis not present

## 2022-12-16 DIAGNOSIS — R7301 Impaired fasting glucose: Secondary | ICD-10-CM | POA: Diagnosis not present

## 2022-12-16 DIAGNOSIS — E782 Mixed hyperlipidemia: Secondary | ICD-10-CM | POA: Diagnosis not present

## 2022-12-16 DIAGNOSIS — I129 Hypertensive chronic kidney disease with stage 1 through stage 4 chronic kidney disease, or unspecified chronic kidney disease: Secondary | ICD-10-CM | POA: Diagnosis not present

## 2023-01-28 DIAGNOSIS — Z23 Encounter for immunization: Secondary | ICD-10-CM | POA: Diagnosis not present

## 2023-03-09 DIAGNOSIS — L578 Other skin changes due to chronic exposure to nonionizing radiation: Secondary | ICD-10-CM | POA: Diagnosis not present

## 2023-03-09 DIAGNOSIS — Z86018 Personal history of other benign neoplasm: Secondary | ICD-10-CM | POA: Diagnosis not present

## 2023-03-09 DIAGNOSIS — D2230 Melanocytic nevi of unspecified part of face: Secondary | ICD-10-CM | POA: Diagnosis not present

## 2023-03-09 DIAGNOSIS — L821 Other seborrheic keratosis: Secondary | ICD-10-CM | POA: Diagnosis not present

## 2023-03-09 DIAGNOSIS — Z85828 Personal history of other malignant neoplasm of skin: Secondary | ICD-10-CM | POA: Diagnosis not present

## 2023-03-09 DIAGNOSIS — D225 Melanocytic nevi of trunk: Secondary | ICD-10-CM | POA: Diagnosis not present

## 2023-03-09 DIAGNOSIS — C44311 Basal cell carcinoma of skin of nose: Secondary | ICD-10-CM | POA: Diagnosis not present

## 2023-03-09 DIAGNOSIS — D485 Neoplasm of uncertain behavior of skin: Secondary | ICD-10-CM | POA: Diagnosis not present

## 2023-03-09 DIAGNOSIS — L570 Actinic keratosis: Secondary | ICD-10-CM | POA: Diagnosis not present

## 2023-03-22 DIAGNOSIS — H35033 Hypertensive retinopathy, bilateral: Secondary | ICD-10-CM | POA: Diagnosis not present

## 2023-04-14 DIAGNOSIS — I129 Hypertensive chronic kidney disease with stage 1 through stage 4 chronic kidney disease, or unspecified chronic kidney disease: Secondary | ICD-10-CM | POA: Diagnosis not present

## 2023-04-14 DIAGNOSIS — E610 Copper deficiency: Secondary | ICD-10-CM | POA: Diagnosis not present

## 2023-04-14 DIAGNOSIS — D7589 Other specified diseases of blood and blood-forming organs: Secondary | ICD-10-CM | POA: Diagnosis not present

## 2023-04-14 DIAGNOSIS — D472 Monoclonal gammopathy: Secondary | ICD-10-CM | POA: Diagnosis not present

## 2023-04-14 DIAGNOSIS — N1831 Chronic kidney disease, stage 3a: Secondary | ICD-10-CM | POA: Diagnosis not present

## 2023-04-14 DIAGNOSIS — Z79899 Other long term (current) drug therapy: Secondary | ICD-10-CM | POA: Diagnosis not present

## 2023-04-19 DIAGNOSIS — I129 Hypertensive chronic kidney disease with stage 1 through stage 4 chronic kidney disease, or unspecified chronic kidney disease: Secondary | ICD-10-CM | POA: Diagnosis not present

## 2023-04-19 DIAGNOSIS — R809 Proteinuria, unspecified: Secondary | ICD-10-CM | POA: Diagnosis not present

## 2023-04-19 DIAGNOSIS — I5032 Chronic diastolic (congestive) heart failure: Secondary | ICD-10-CM | POA: Diagnosis not present

## 2023-04-19 DIAGNOSIS — D472 Monoclonal gammopathy: Secondary | ICD-10-CM | POA: Diagnosis not present

## 2023-04-26 DIAGNOSIS — C44311 Basal cell carcinoma of skin of nose: Secondary | ICD-10-CM | POA: Diagnosis not present

## 2023-06-10 DIAGNOSIS — R7301 Impaired fasting glucose: Secondary | ICD-10-CM | POA: Diagnosis not present

## 2023-06-10 DIAGNOSIS — E782 Mixed hyperlipidemia: Secondary | ICD-10-CM | POA: Diagnosis not present

## 2023-06-10 DIAGNOSIS — E559 Vitamin D deficiency, unspecified: Secondary | ICD-10-CM | POA: Diagnosis not present

## 2023-06-10 DIAGNOSIS — E039 Hypothyroidism, unspecified: Secondary | ICD-10-CM | POA: Diagnosis not present

## 2023-06-16 DIAGNOSIS — J4530 Mild persistent asthma, uncomplicated: Secondary | ICD-10-CM | POA: Diagnosis not present

## 2023-06-16 DIAGNOSIS — G629 Polyneuropathy, unspecified: Secondary | ICD-10-CM | POA: Diagnosis not present

## 2023-06-16 DIAGNOSIS — D472 Monoclonal gammopathy: Secondary | ICD-10-CM | POA: Diagnosis not present

## 2023-06-16 DIAGNOSIS — N401 Enlarged prostate with lower urinary tract symptoms: Secondary | ICD-10-CM | POA: Diagnosis not present

## 2023-06-16 DIAGNOSIS — N1832 Chronic kidney disease, stage 3b: Secondary | ICD-10-CM | POA: Diagnosis not present

## 2023-06-16 DIAGNOSIS — E875 Hyperkalemia: Secondary | ICD-10-CM | POA: Diagnosis not present

## 2023-06-16 DIAGNOSIS — E782 Mixed hyperlipidemia: Secondary | ICD-10-CM | POA: Diagnosis not present

## 2023-06-16 DIAGNOSIS — E039 Hypothyroidism, unspecified: Secondary | ICD-10-CM | POA: Diagnosis not present

## 2023-06-16 DIAGNOSIS — E559 Vitamin D deficiency, unspecified: Secondary | ICD-10-CM | POA: Diagnosis not present

## 2023-06-16 DIAGNOSIS — I129 Hypertensive chronic kidney disease with stage 1 through stage 4 chronic kidney disease, or unspecified chronic kidney disease: Secondary | ICD-10-CM | POA: Diagnosis not present

## 2023-06-16 DIAGNOSIS — R7301 Impaired fasting glucose: Secondary | ICD-10-CM | POA: Diagnosis not present

## 2023-06-20 DIAGNOSIS — R519 Headache, unspecified: Secondary | ICD-10-CM | POA: Diagnosis not present

## 2023-06-24 ENCOUNTER — Other Ambulatory Visit (HOSPITAL_COMMUNITY): Payer: Self-pay | Admitting: Nephrology

## 2023-06-24 DIAGNOSIS — N281 Cyst of kidney, acquired: Secondary | ICD-10-CM

## 2023-06-27 ENCOUNTER — Encounter: Payer: Self-pay | Admitting: Internal Medicine

## 2023-07-01 ENCOUNTER — Ambulatory Visit (HOSPITAL_COMMUNITY)
Admission: RE | Admit: 2023-07-01 | Discharge: 2023-07-01 | Disposition: A | Discharge status: Home / Self Care | Payer: Medicare Other | Source: Ambulatory Visit | Attending: Nephrology | Admitting: Nephrology

## 2023-07-01 DIAGNOSIS — N281 Cyst of kidney, acquired: Secondary | ICD-10-CM | POA: Diagnosis not present

## 2023-07-01 DIAGNOSIS — N189 Chronic kidney disease, unspecified: Secondary | ICD-10-CM | POA: Diagnosis not present

## 2023-07-01 DIAGNOSIS — N200 Calculus of kidney: Secondary | ICD-10-CM | POA: Diagnosis not present

## 2023-08-01 NOTE — Progress Notes (Unsigned)
GI Office Note    Referring Provider: Benita Stabile, MD Primary Care Physician:  Benita Stabile, MD  Primary Gastroenterologist: Roetta Sessions, MD   Chief Complaint   No chief complaint on file.   History of Present Illness   [name] is a 82 y.o. male presenting today for follow up. Last seen 06/2022. Has h/o GERD/peptic stricture s/p dilation in 2022. H/o couple small adenomas removed 2022, no future colonoscopy recommended due to age.         Medications   Current Outpatient Medications  Medication Sig Dispense Refill   amLODipine (NORVASC) 5 MG tablet Take 5 mg by mouth daily.  5   aspirin EC 81 MG tablet Take 81 mg by mouth daily.     Calcium Carbonate-Vitamin D (OYSTER SHELL CALCIUM/D) 500-5 MG-MCG TABS Take by mouth.     carboxymethylcellulose (REFRESH PLUS) 0.5 % SOLN Place 1 drop into both eyes 3 (three) times daily as needed (dry eyes).     cetirizine (ZYRTEC) 10 MG tablet cetirizine 10 mg tablet  Take 1 tablet every day by oral route.     Cholecalciferol (VITAMIN D3 MAXIMUM STRENGTH) 125 MCG (5000 UT) capsule Take 5,000 Units by mouth daily.     Cyanocobalamin (VITAMIN B-12) 5000 MCG TBDP Take 5,000 mcg by mouth daily.     fluticasone (FLONASE) 50 MCG/ACT nasal spray fluticasone propionate 50 mcg/actuation nasal spray,suspension     Fluticasone-Salmeterol (WIXELA INHUB IN) Inhale 1 puff into the lungs daily as needed (Allergies and Asthma).     folic acid (FOLVITE) 800 MCG tablet 1 tablet     guaiFENesin (MUCINEX) 600 MG 12 hr tablet Take 600 mg by mouth every other day.     levalbuterol (XOPENEX) 1.25 MG/3ML nebulizer solution Inhale 1.25 mg into the lungs 3 (three) times daily as needed for wheezing.     levothyroxine (SYNTHROID, LEVOTHROID) 75 MCG tablet Take 75 mcg by mouth daily before breakfast.     pantoprazole (PROTONIX) 40 MG tablet Take 1 tablet by mouth daily as needed (Acid reflex).     simvastatin (ZOCOR) 40 MG tablet Take 40 mg by mouth at  bedtime.     tamsulosin (FLOMAX) 0.4 MG CAPS capsule Take 0.4 mg by mouth daily.     triamterene-hydrochlorothiazide (DYAZIDE) 37.5-25 MG capsule Take 1 capsule by mouth daily.     zinc gluconate 50 MG tablet Take 50 mg by mouth every other day.     No current facility-administered medications for this visit.    Allergies   Allergies as of 08/02/2023 - Review Complete 06/29/2022  Allergen Reaction Noted   Meperidine and related Other (See Comments) 07/18/2012   Sulfa antibiotics Rash 07/18/2012     Past Medical History   Past Medical History:  Diagnosis Date   Asthma    Blood transfusion abn reaction or complication, no procedure mishap 1979   reaction due to wrong blood type given   GERD (gastroesophageal reflux disease)    occasional   H/O: pneumonia 02/27/2015   Hyperlipidemia    Hypertension    Hypothyroidism    MGUS (monoclonal gammopathy of unknown significance)    Myocardial infarction (HCC)    hx of abnormal ekg showed prior myocardial infarction   Renal disorder    Vitamin B 12 deficiency 09/30/2016    Past Surgical History   Past Surgical History:  Procedure Laterality Date   APPENDECTOMY  1963   CHOLECYSTECTOMY  1979   COLONOSCOPY  03/28/2008   [id number] rectum; left-sided transverse diverticula diminutive polyp; descending colon. Adenomas.   COLONOSCOPY N/A 06/07/2013   Procedure: COLONOSCOPY;  Surgeon: Corbin Ade, MD;  Location: [facility];  Service: Endoscopy;  Laterality: N/A;  9:45   COLONOSCOPY WITH PROPOFOL N/A 06/01/2021   Procedure: COLONOSCOPY WITH PROPOFOL;  Surgeon: Corbin Ade, MD;  Location: [facility];  Service: Endoscopy;  Laterality: N/A;  12:15pm   ESOPHAGOGASTRODUODENOSCOPY N/A 02/25/2021   Procedure: ESOPHAGOGASTRODUODENOSCOPY (EGD);  Surgeon: Corbin Ade, MD;  [facility]: [facility];  Service: Endoscopy;  Laterality: N/A;  pm appt   HERNIA REPAIR  2005   LUMBAR LAMINECTOMY/DECOMPRESSION MICRODISCECTOMY  07/26/2012    Procedure: LUMBAR LAMINECTOMY/DECOMPRESSION MICRODISCECTOMY;  Surgeon: Javier Docker, MD;  Location: [facility];  Service: Orthopedics;  Laterality: N/A;  Lumbar Decompression L4-L5   MALONEY DILATION N/A 02/25/2021   Procedure: Elease Hashimoto DILATION;  Surgeon: Corbin Ade, MD;  [facility]: [facility];  Service: Endoscopy;  Laterality: N/A;   POLYPECTOMY  06/01/2021   Procedure: POLYPECTOMY INTESTINAL;  Surgeon: Corbin Ade, MD;  Location: [facility];  Service: Endoscopy;;   STOMACH SURGERY  2003   exploratory surgery, fatty tumors with obstruction?   TONSILLECTOMY  1962    Past Family History   Family History  Problem Relation Age of Onset   Thyroid disease Mother    Heart disease Mother    Hypertension Mother    Clotting disorder Father    Hypertension Father    Cancer Sister    Hypertension Sister    Colon cancer Neg Hx     Past Social History   Social History   Socioeconomic History   Marital status: Married    Spouse name: Not on file   Number of children: Not on file   Years of education: Not on file   Highest education level: Not on file  Occupational History   Not on file  Tobacco Use   Smoking status: Never   Smokeless tobacco: Never  Vaping Use   Vaping status: Never Used  Substance and Sexual Activity   Alcohol use: Yes    Comment: occasional   Drug use: No   Sexual activity: Never  Other Topics Concern   Not on file  Social History Narrative   Not on file   Social Determinants of Health   Financial Resource Strain: Low Risk  (03/13/2021)   Overall Financial Resource Strain (CARDIA)    Difficulty of Paying Living Expenses: Not hard at all  Food Insecurity: No Food Insecurity (03/13/2021)   Hunger Vital Sign    Worried About Running Out of Food in the Last Year: Never true    Ran Out of Food in the Last Year: Never true  Transportation Needs: No Transportation Needs (03/13/2021)   PRAPARE - Administrator, Civil Service (Medical): No     Lack of Transportation (Non-Medical): No  Physical Activity: Sufficiently Active (03/13/2021)   Exercise Vital Sign    Days of Exercise per Week: 7 days    Minutes of Exercise per Session: 60 min  Stress: No Stress Concern Present (03/13/2021)   [employer] of Occupational Health - Occupational Stress Questionnaire    Feeling of Stress : Not at all  Social Connections: Moderately Isolated (03/13/2021)   Social Connection and Isolation Panel [NHANES]    Frequency of Communication with Friends and Family: More than three times a week    Frequency of Social Gatherings with Friends  and Family: Once a week    Attends Religious Services: Never    Active Member of Clubs or Organizations: No    Attends Banker Meetings: Never    Marital Status: Married  Catering manager Violence: Not At Risk (03/13/2021)   Humiliation, Afraid, Rape, and Kick questionnaire    Fear of Current or Ex-Partner: No    Emotionally Abused: No    Physically Abused: No    Sexually Abused: No    Review of Systems   General: Negative for anorexia, weight loss, fever, chills, fatigue, weakness. ENT: Negative for hoarseness, difficulty swallowing , nasal congestion. CV: Negative for chest pain, angina, palpitations, dyspnea on exertion, peripheral edema.  Respiratory: Negative for dyspnea at rest, dyspnea on exertion, cough, sputum, wheezing.  GI: See history of present illness. GU:  Negative for dysuria, hematuria, urinary incontinence, urinary frequency, nocturnal urination.  Endo: Negative for unusual weight change.     Physical Exam   There were no vitals taken for this visit.   General: Well-nourished, well-developed in no acute distress.  Eyes: No icterus. Mouth: Oropharyngeal mucosa moist and pink , no lesions erythema or exudate. Lungs: Clear to auscultation bilaterally.  Heart: Regular rate and rhythm, no murmurs rubs or gallops.  Abdomen: Bowel sounds are normal, nontender,  nondistended, no hepatosplenomegaly or masses,  no abdominal bruits or hernia , no rebound or guarding.  Rectal: ***  Extremities: No lower extremity edema. No clubbing or deformities. Neuro: Alert and oriented x 4   Skin: Warm and dry, no jaundice.   Psych: Alert and cooperative, normal mood and affect.  Labs   *** Imaging Studies   No results found.  Assessment       PLAN   ***   Leanna Battles. Melvyn Neth, MHS, PA-C [facility]

## 2023-08-02 ENCOUNTER — Ambulatory Visit (INDEPENDENT_AMBULATORY_CARE_PROVIDER_SITE_OTHER): Payer: Medicare Other | Admitting: Gastroenterology

## 2023-08-02 ENCOUNTER — Encounter: Payer: Self-pay | Admitting: Gastroenterology

## 2023-08-02 VITALS — BP 132/72 | HR 76 | Temp 98.6°F | Ht 70.5 in | Wt 207.2 lb

## 2023-08-02 DIAGNOSIS — K219 Gastro-esophageal reflux disease without esophagitis: Secondary | ICD-10-CM | POA: Diagnosis not present

## 2023-08-02 NOTE — Patient Instructions (Signed)
Continue pantoprazole once daily as needed for heartburn and indigestion.  Please continue to monitor for more frequent episodes of swallowing issues. If more frequent, please let us know as you may require another endoscopy with dilation of your esophagus. If your swallowing issues become more frequent, please take pantoprazole every day before breakfast. We will see you back in one year or sooner if needed. Call with any questions or concerns.

## 2023-08-15 DIAGNOSIS — D472 Monoclonal gammopathy: Secondary | ICD-10-CM | POA: Diagnosis not present

## 2023-08-15 DIAGNOSIS — D7589 Other specified diseases of blood and blood-forming organs: Secondary | ICD-10-CM | POA: Diagnosis not present

## 2023-08-15 DIAGNOSIS — N189 Chronic kidney disease, unspecified: Secondary | ICD-10-CM | POA: Diagnosis not present

## 2023-08-15 DIAGNOSIS — E610 Copper deficiency: Secondary | ICD-10-CM | POA: Diagnosis not present

## 2023-08-15 DIAGNOSIS — N1832 Chronic kidney disease, stage 3b: Secondary | ICD-10-CM | POA: Diagnosis not present

## 2023-08-15 DIAGNOSIS — D539 Nutritional anemia, unspecified: Secondary | ICD-10-CM | POA: Diagnosis not present

## 2023-08-15 DIAGNOSIS — R809 Proteinuria, unspecified: Secondary | ICD-10-CM | POA: Diagnosis not present

## 2023-08-15 DIAGNOSIS — I5032 Chronic diastolic (congestive) heart failure: Secondary | ICD-10-CM | POA: Diagnosis not present

## 2023-08-22 DIAGNOSIS — N1831 Chronic kidney disease, stage 3a: Secondary | ICD-10-CM | POA: Diagnosis not present

## 2023-08-22 DIAGNOSIS — D7589 Other specified diseases of blood and blood-forming organs: Secondary | ICD-10-CM | POA: Diagnosis not present

## 2023-08-22 DIAGNOSIS — D472 Monoclonal gammopathy: Secondary | ICD-10-CM | POA: Diagnosis not present

## 2023-08-22 DIAGNOSIS — R809 Proteinuria, unspecified: Secondary | ICD-10-CM | POA: Diagnosis not present

## 2023-09-12 ENCOUNTER — Telehealth: Payer: Self-pay | Admitting: Gastroenterology

## 2023-09-12 NOTE — Telephone Encounter (Signed)
Please let patient know that Dr. Jena Gauss reached out after signing off on my office note, he prefers patient to take PPI daily not prn given his history of peptic strictures.

## 2023-09-13 NOTE — Telephone Encounter (Signed)
Spoke with wife's Rubie Maid (on DPR) and informed her of providers recommendations. Verbalized understanding.

## 2023-09-23 DIAGNOSIS — D485 Neoplasm of uncertain behavior of skin: Secondary | ICD-10-CM | POA: Diagnosis not present

## 2023-09-23 DIAGNOSIS — D2230 Melanocytic nevi of unspecified part of face: Secondary | ICD-10-CM | POA: Diagnosis not present

## 2023-09-23 DIAGNOSIS — Z86018 Personal history of other benign neoplasm: Secondary | ICD-10-CM | POA: Diagnosis not present

## 2023-09-23 DIAGNOSIS — L570 Actinic keratosis: Secondary | ICD-10-CM | POA: Diagnosis not present

## 2023-09-23 DIAGNOSIS — D225 Melanocytic nevi of trunk: Secondary | ICD-10-CM | POA: Diagnosis not present

## 2023-09-23 DIAGNOSIS — Z85828 Personal history of other malignant neoplasm of skin: Secondary | ICD-10-CM | POA: Diagnosis not present

## 2023-09-23 DIAGNOSIS — L821 Other seborrheic keratosis: Secondary | ICD-10-CM | POA: Diagnosis not present

## 2023-09-23 DIAGNOSIS — L578 Other skin changes due to chronic exposure to nonionizing radiation: Secondary | ICD-10-CM | POA: Diagnosis not present

## 2023-12-27 ENCOUNTER — Telehealth: Payer: Self-pay

## 2023-12-27 ENCOUNTER — Encounter: Payer: Self-pay | Admitting: *Deleted

## 2023-12-27 ENCOUNTER — Encounter: Payer: Self-pay | Admitting: Internal Medicine

## 2023-12-27 ENCOUNTER — Telehealth: Payer: Self-pay | Admitting: *Deleted

## 2023-12-27 ENCOUNTER — Ambulatory Visit (INDEPENDENT_AMBULATORY_CARE_PROVIDER_SITE_OTHER): Payer: Medicare Other | Admitting: Internal Medicine

## 2023-12-27 VITALS — BP 129/75 | HR 81 | Temp 97.3°F | Ht 70.0 in | Wt 198.2 lb

## 2023-12-27 DIAGNOSIS — N1831 Chronic kidney disease, stage 3a: Secondary | ICD-10-CM | POA: Diagnosis not present

## 2023-12-27 DIAGNOSIS — K219 Gastro-esophageal reflux disease without esophagitis: Secondary | ICD-10-CM

## 2023-12-27 DIAGNOSIS — R1310 Dysphagia, unspecified: Secondary | ICD-10-CM | POA: Diagnosis not present

## 2023-12-27 DIAGNOSIS — D472 Monoclonal gammopathy: Secondary | ICD-10-CM | POA: Diagnosis not present

## 2023-12-27 DIAGNOSIS — R1319 Other dysphagia: Secondary | ICD-10-CM

## 2023-12-27 DIAGNOSIS — I129 Hypertensive chronic kidney disease with stage 1 through stage 4 chronic kidney disease, or unspecified chronic kidney disease: Secondary | ICD-10-CM | POA: Diagnosis not present

## 2023-12-27 DIAGNOSIS — D7589 Other specified diseases of blood and blood-forming organs: Secondary | ICD-10-CM | POA: Diagnosis not present

## 2023-12-27 DIAGNOSIS — R809 Proteinuria, unspecified: Secondary | ICD-10-CM | POA: Diagnosis not present

## 2023-12-27 DIAGNOSIS — D539 Nutritional anemia, unspecified: Secondary | ICD-10-CM | POA: Diagnosis not present

## 2023-12-27 DIAGNOSIS — N189 Chronic kidney disease, unspecified: Secondary | ICD-10-CM | POA: Diagnosis not present

## 2023-12-27 DIAGNOSIS — E610 Copper deficiency: Secondary | ICD-10-CM | POA: Diagnosis not present

## 2023-12-27 MED ORDER — DEXLANSOPRAZOLE 60 MG PO CPDR
60.0000 mg | DELAYED_RELEASE_CAPSULE | Freq: Every day | ORAL | 11 refills | Status: DC
Start: 2023-12-27 — End: 2024-01-02

## 2023-12-27 NOTE — Patient Instructions (Signed)
 It was good to see you again today!  As discussed, we will schedule an upper endoscopy with esophageal dilation (dysphagia).  ASA 3.  Do not take anymore Protonix or pantoprazole  Prescription for Dexilant  60 mg capsule.  Take 1 daily 30 minutes before breakfast (dispense 30 with 11 refills)  If we have any trouble getting Dexilant  approved, there are other good alternatives we may try.  Further recommendations to follow.

## 2023-12-27 NOTE — Telephone Encounter (Signed)
Pt informed of pre-op appointment Monday 01/30/24, arrive at 1:15 pm.

## 2023-12-27 NOTE — Telephone Encounter (Signed)
PA for dexlansoprazole was denied, pt must try lansoprazole, omeprazole or esomeprazole. Please advise.

## 2023-12-27 NOTE — Progress Notes (Signed)
 Primary Care Physician:  Shona Norleen PEDLAR, MD Primary Gastroenterologist:  Dr. Shaaron  Pre-Procedure History & Physical: HPI:  [name] is a 83 y.o. male here for follow-up of GERD and peptic stricture.  Patient reports recurrent dysphagia over the past 6 weeks.  Known peptic stricture which was dilated back in 2022.  No regular use of Protonix.  He was told to take it every day last fall developed headache and felt bad.  Stopped taking and stated he called the office but we have no record.  So, he stopped taking anything for reflux. No Issues no change in bowel function.  No melena or rectal bleeding.  No nausea or vomiting.  Past Medical History:  Diagnosis Date   Asthma    Blood transfusion abn reaction or complication, no procedure mishap 1979   reaction due to wrong blood type given   GERD (gastroesophageal reflux disease)    occasional   H/O: pneumonia 02/27/2015   Hyperlipidemia    Hypertension    Hypothyroidism    MGUS (monoclonal gammopathy of unknown significance)    Myocardial infarction (HCC)    hx of abnormal ekg showed prior myocardial infarction   Renal disorder    Vitamin B 12 deficiency 09/30/2016    Past Surgical History:  Procedure Laterality Date   APPENDECTOMY  1963   CHOLECYSTECTOMY  1979   COLONOSCOPY  03/28/2008   MFM:[id number] rectum; left-sided transverse diverticula diminutive polyp; descending colon. Adenomas.   COLONOSCOPY N/A 06/07/2013   Procedure: COLONOSCOPY;  Surgeon: Lamar CHRISTELLA Shaaron, MD;  [facility]: [facility];  Service: Endoscopy;  Laterality: N/A;  9:45   COLONOSCOPY WITH PROPOFOL  N/A 06/01/2021   Procedure: COLONOSCOPY WITH PROPOFOL ;  Surgeon: Shaaron Lamar CHRISTELLA, MD;  [facility];  Service: Endoscopy;  Laterality: N/A;  12:15pm   ESOPHAGOGASTRODUODENOSCOPY N/A 02/25/2021   Procedure: ESOPHAGOGASTRODUODENOSCOPY (EGD);  Surgeon: Shaaron Lamar CHRISTELLA, MD;  [facility];  Service: Endoscopy;  Laterality: N/A;  pm appt   HERNIA REPAIR   2005   LUMBAR LAMINECTOMY/DECOMPRESSION MICRODISCECTOMY  07/26/2012   Procedure: LUMBAR LAMINECTOMY/DECOMPRESSION MICRODISCECTOMY;  Surgeon: Reyes JAYSON Billing, MD;  Location: [facility];  Service: Orthopedics;  Laterality: N/A;  Lumbar Decompression L4-L5   MALONEY DILATION N/A 02/25/2021   Procedure: AGAPITO DILATION;  Surgeon: Shaaron Lamar CHRISTELLA, MD;  [facility];  Service: Endoscopy;  Laterality: N/A;   POLYPECTOMY  06/01/2021   Procedure: POLYPECTOMY INTESTINAL;  Surgeon: Shaaron Lamar CHRISTELLA, MD;  [facility];  Service: Endoscopy;;   STOMACH SURGERY  2003   exploratory surgery, fatty tumors with obstruction?   TONSILLECTOMY  1962    Prior to Admission medications   Medication Sig Start Date End Date Taking? Authorizing Provider  acetaminophen  (TYLENOL ) 500 MG tablet Take 500 mg by mouth every 6 (six) hours as needed.   Yes [provider]  amLODipine  (NORVASC ) 5 MG tablet Take 5 mg by mouth daily. 04/22/17  Yes [provider]  aspirin EC 81 MG tablet Take 81 mg by mouth daily.   Yes [provider]  Cholecalciferol (VITAMIN D3) 50 MCG (2000 UT) capsule Take 2,000 Units by mouth daily.   Yes [provider]  copper  tablet Take 4 mg by mouth daily.   Yes [provider]  Cyanocobalamin  (VITAMIN B-12) 5000 MCG TBDP Take 5,000 mcg by mouth daily.   Yes [provider]  diphenhydrAMINE (BENADRYL) 25 mg capsule Take 25 mg by mouth every 6 (six)  hours as needed.   Yes [provider]  Fluticasone-Salmeterol (WIXELA INHUB IN) Inhale 1 puff into the lungs daily as needed (Allergies and Asthma).   Yes [provider]  guaiFENesin (MUCINEX) 600 MG 12 hr tablet Take 600 mg by mouth every other day.   Yes [provider]  levothyroxine  (SYNTHROID , LEVOTHROID) 75 MCG tablet Take 75 mcg by mouth daily before breakfast.   Yes [provider]  simvastatin (ZOCOR) 40 MG tablet Take 40 mg by mouth at bedtime.    Yes [provider]  tamsulosin (FLOMAX) 0.4 MG CAPS capsule Take 0.4 mg by mouth daily.   Yes [provider]  triamterene -hydrochlorothiazide (DYAZIDE) 37.5-25 MG capsule Take 1 capsule by mouth daily.   Yes [provider]    Allergies as of 12/27/2023 - Review Complete 12/27/2023  Allergen Reaction Noted   Meperidine  and related Other (See Comments) 07/18/2012   Sulfa antibiotics Rash 07/18/2012    Family History  Problem Relation Age of Onset   Thyroid  disease Mother    Heart disease Mother    Hypertension Mother    Clotting disorder Father    Hypertension Father    Cancer Sister    Hypertension Sister    Colon cancer Neg Hx     Social History   Socioeconomic History   Marital status: Married    Spouse name: Not on file   Number of children: Not on file   Years of education: Not on file   Highest education level: Not on file  Occupational History   Not on file  Tobacco Use   Smoking status: Never   Smokeless tobacco: Never  Vaping Use   Vaping status: Never Used  Substance and Sexual Activity   Alcohol use: Yes    Comment: occasional   Drug use: No   Sexual activity: Never  Other Topics Concern   Not on file  Social History Narrative   Not on file   Social Drivers of Health   Financial Resource Strain: Low Risk  (03/13/2021)   Overall Financial Resource Strain (CARDIA)    Difficulty of Paying Living Expenses: Not hard at all  Food Insecurity: No Food Insecurity (03/13/2021)   Hunger Vital Sign    Worried About Running Out of Food in the Last Year: Never true    Ran Out of Food in the Last Year: Never true  Transportation Needs: No Transportation Needs (03/13/2021)   PRAPARE - Administrator, Civil Service (Medical): No    Lack of Transportation (Non-Medical): No  Physical Activity: Sufficiently Active (03/13/2021)   Exercise Vital Sign    Days of Exercise per Week: 7 days    Minutes of Exercise per Session: 60 min   Stress: No Stress Concern Present (03/13/2021)   [employer] of Occupational Health - Occupational Stress Questionnaire    Feeling of Stress : Not at all  Social Connections: Moderately Isolated (03/13/2021)   Social Connection and Isolation Panel [NHANES]    Frequency of Communication with Friends and Family: More than three times a week    Frequency of Social Gatherings with Friends and Family: Once a week    Attends Religious Services: Never    Database Administrator or Organizations: No    Attends Banker Meetings: Never    Marital Status: Married  Catering Manager Violence: Not At Risk (03/13/2021)   Humiliation, Afraid, Rape, and Kick questionnaire    Fear of Current or Ex-Partner: No  Emotionally Abused: No    Physically Abused: No    Sexually Abused: No    Review of Systems: See HPI, otherwise negative ROS  Physical Exam: BP 129/75 (BP Location: Left Arm, Patient Position: Sitting, Cuff Size: Large)   Pulse 81   Temp (!) 97.3 F (36.3 C) (Oral)   Ht 5' 10 (1.778 m)   Wt 198 lb 3.2 oz (89.9 kg)   SpO2 96%   BMI 28.44 kg/m  General:   Alert,  Well-developed, well-nourished, pleasant and cooperative in NAD Neck:  Supple; no masses or thyromegaly. No significant cervical adenopathy. Lungs:  Clear throughout to auscultation.   No wheezes, crackles, or rhonchi. No acute distress. Heart:  Regular rate and rhythm; no murmurs, clicks, rubs,  or gallops. Abdomen: Non-distended, normal bowel sounds.  Soft and nontender without appreciable mass or hepatosplenomegaly.   Impression/Plan:   Very pleasant 83 year old retired state [employer] GERD manifest as peptic stricture requiring dilation in the past.  Only on as needed/sporadic use of PPI therapy.  This is likely hasten tightening of the stricture.  Further evaluation is warranted.  Had discussion with patient about GERD being a chronic disease complicated with a stricture acid suppression taken every day would  be the best practice.  He needs a repeat EGD with esophageal dilation is feasible/appropriate.   As discussed, we will schedule an upper endoscopy with esophageal dilation (dysphagia).  ASA 3.  The risks, benefits, limitations, alternatives and imponderables have been reviewed with the patient. Questions have been answered. All parties are agreeable.    Do not take anymore Protonix or pantoprazole  Prescription for Dexilant  60 mg capsule.  Take 1 daily 30 minutes before breakfast (dispense 30 with 11 refills)  Further recommendations to follow.     Notice: This dictation was prepared with Dragon dictation along with smaller phrase technology. Any transcriptional errors that result from this process are unintentional and may not be corrected upon review.

## 2023-12-27 NOTE — Telephone Encounter (Signed)
-----   Message from [name] sent at 12/27/2023 10:40 AM EST ----- Please add Protonix to allergy list.  Thanks.

## 2023-12-27 NOTE — Telephone Encounter (Signed)
 Done

## 2023-12-28 ENCOUNTER — Telehealth: Payer: Self-pay | Admitting: Internal Medicine

## 2023-12-28 NOTE — Telephone Encounter (Signed)
 Patient came by the office to say the Dexalon (sp?) for his cost would be $186.  He said Dr. Riley Cheadle said to him yesterday, 2/4, if the cost was too high he could take another route.  Please advise.

## 2023-12-29 NOTE — Telephone Encounter (Signed)
 Pt came by the office yesterday to check on this. Which one would you like for me to send in for the patient?

## 2023-12-29 NOTE — Telephone Encounter (Signed)
 See other telephone note.

## 2024-01-02 ENCOUNTER — Other Ambulatory Visit: Payer: Self-pay

## 2024-01-02 MED ORDER — ESOMEPRAZOLE MAGNESIUM 40 MG PO CPDR: 40.0000 mg | DELAYED_RELEASE_CAPSULE | Freq: Every day | ORAL | 11 refills | Status: DC

## 2024-01-02 NOTE — Telephone Encounter (Signed)
 Rx was sent to pharmacy on file, pt was made aware.

## 2024-01-04 DIAGNOSIS — E610 Copper deficiency: Secondary | ICD-10-CM | POA: Diagnosis not present

## 2024-01-04 DIAGNOSIS — D7589 Other specified diseases of blood and blood-forming organs: Secondary | ICD-10-CM | POA: Diagnosis not present

## 2024-01-04 DIAGNOSIS — R809 Proteinuria, unspecified: Secondary | ICD-10-CM | POA: Diagnosis not present

## 2024-01-04 DIAGNOSIS — N1831 Chronic kidney disease, stage 3a: Secondary | ICD-10-CM | POA: Diagnosis not present

## 2024-01-24 ENCOUNTER — Other Ambulatory Visit: Payer: Self-pay | Admitting: Physician Assistant

## 2024-01-24 DIAGNOSIS — R768 Other specified abnormal immunological findings in serum: Secondary | ICD-10-CM

## 2024-01-24 DIAGNOSIS — E039 Hypothyroidism, unspecified: Secondary | ICD-10-CM

## 2024-01-24 DIAGNOSIS — E538 Deficiency of other specified B group vitamins: Secondary | ICD-10-CM

## 2024-01-24 DIAGNOSIS — D7589 Other specified diseases of blood and blood-forming organs: Secondary | ICD-10-CM

## 2024-01-24 NOTE — Progress Notes (Addendum)
 Patient referred back to our office by nephrology NP for further evaluation of chronic stable macrocytosis.  We will check the following preappointment labs: CBC/D B12, folate, MMA, homocystine, copper TSH SPEP, immunofixation, light chains LDH, reticulocytes, CMP, fractionated bilirubin  Depending on the above results, we would also consider abdominal ultrasound as macrocytosis can be caused by fatty liver disease.  Patient is not currently on any medications classically associated with macrocytosis.    Carnella Guadalajara, PA-C 01/24/24 5:05 PM

## 2024-01-25 ENCOUNTER — Inpatient Hospital Stay: Payer: Medicare Other

## 2024-01-25 ENCOUNTER — Inpatient Hospital Stay: Attending: Physician Assistant

## 2024-01-25 DIAGNOSIS — Z801 Family history of malignant neoplasm of trachea, bronchus and lung: Secondary | ICD-10-CM | POA: Insufficient documentation

## 2024-01-25 DIAGNOSIS — D72822 Plasmacytosis: Secondary | ICD-10-CM | POA: Diagnosis not present

## 2024-01-25 DIAGNOSIS — D7589 Other specified diseases of blood and blood-forming organs: Secondary | ICD-10-CM | POA: Insufficient documentation

## 2024-01-25 DIAGNOSIS — Z79620 Long term (current) use of immunosuppressive biologic: Secondary | ICD-10-CM | POA: Diagnosis not present

## 2024-01-25 DIAGNOSIS — R768 Other specified abnormal immunological findings in serum: Secondary | ICD-10-CM

## 2024-01-25 DIAGNOSIS — Z79899 Other long term (current) drug therapy: Secondary | ICD-10-CM | POA: Diagnosis not present

## 2024-01-25 DIAGNOSIS — N183 Chronic kidney disease, stage 3 unspecified: Secondary | ICD-10-CM | POA: Diagnosis not present

## 2024-01-25 DIAGNOSIS — I129 Hypertensive chronic kidney disease with stage 1 through stage 4 chronic kidney disease, or unspecified chronic kidney disease: Secondary | ICD-10-CM | POA: Diagnosis not present

## 2024-01-25 DIAGNOSIS — E039 Hypothyroidism, unspecified: Secondary | ICD-10-CM

## 2024-01-25 DIAGNOSIS — E538 Deficiency of other specified B group vitamins: Secondary | ICD-10-CM

## 2024-01-25 LAB — CBC WITH DIFFERENTIAL/PLATELET
Abs Immature Granulocytes: 0.02 10*3/uL (ref 0.00–0.07)
Basophils Absolute: 0.1 10*3/uL (ref 0.0–0.1)
Basophils Relative: 1 %
Eosinophils Absolute: 0.2 10*3/uL (ref 0.0–0.5)
Eosinophils Relative: 4 %
HCT: 37.8 % — ABNORMAL LOW (ref 39.0–52.0)
Hemoglobin: 13.5 g/dL (ref 13.0–17.0)
Immature Granulocytes: 0 %
Lymphocytes Relative: 15 %
Lymphs Abs: 0.9 10*3/uL (ref 0.7–4.0)
MCH: 35.8 pg — ABNORMAL HIGH (ref 26.0–34.0)
MCHC: 35.7 g/dL (ref 30.0–36.0)
MCV: 100.3 fL — ABNORMAL HIGH (ref 80.0–100.0)
Monocytes Absolute: 0.9 10*3/uL (ref 0.1–1.0)
Monocytes Relative: 15 %
Neutro Abs: 3.8 10*3/uL (ref 1.7–7.7)
Neutrophils Relative %: 65 %
Platelets: 263 10*3/uL (ref 150–400)
RBC: 3.77 MIL/uL — ABNORMAL LOW (ref 4.22–5.81)
RDW: 13.2 % (ref 11.5–15.5)
WBC: 5.8 10*3/uL (ref 4.0–10.5)
nRBC: 0 % (ref 0.0–0.2)

## 2024-01-25 LAB — COMPREHENSIVE METABOLIC PANEL
ALT: 15 U/L (ref 0–44)
AST: 29 U/L (ref 15–41)
Albumin: 4 g/dL (ref 3.5–5.0)
Alkaline Phosphatase: 56 U/L (ref 38–126)
Anion gap: 10 (ref 5–15)
BUN: 12 mg/dL (ref 8–23)
CO2: 27 mmol/L (ref 22–32)
Calcium: 9.1 mg/dL (ref 8.9–10.3)
Chloride: 89 mmol/L — ABNORMAL LOW (ref 98–111)
Creatinine, Ser: 1.46 mg/dL — ABNORMAL HIGH (ref 0.61–1.24)
GFR, Estimated: 48 mL/min — ABNORMAL LOW (ref >60)
Glucose, Bld: 113 mg/dL — ABNORMAL HIGH (ref 70–99)
Potassium: 3.5 mmol/L (ref 3.5–5.1)
Sodium: 126 mmol/L — ABNORMAL LOW (ref 135–145)
Total Bilirubin: 0.6 mg/dL (ref 0.0–1.2)
Total Protein: 7.3 g/dL (ref 6.5–8.1)

## 2024-01-25 LAB — RETICULOCYTES
Immature Retic Fract: 13 % (ref 2.3–15.9)
RBC.: 3.77 MIL/uL — ABNORMAL LOW (ref 4.22–5.81)
Retic Count, Absolute: 50.9 10*3/uL (ref 19.0–186.0)
Retic Ct Pct: 1.4 % (ref 0.4–3.1)

## 2024-01-25 LAB — TSH: TSH: 1.152 u[IU]/mL (ref 0.350–4.500)

## 2024-01-25 LAB — FOLATE: Folate: 4.8 ng/mL — ABNORMAL LOW (ref >5.9)

## 2024-01-25 LAB — LACTATE DEHYDROGENASE: LDH: 109 U/L (ref 98–192)

## 2024-01-25 LAB — VITAMIN B12: Vitamin B-12: 2889 pg/mL — ABNORMAL HIGH (ref 180–914)

## 2024-01-26 LAB — KAPPA/LAMBDA LIGHT CHAINS
Kappa free light chain: 28.5 mg/L — ABNORMAL HIGH (ref 3.3–19.4)
Kappa, lambda light chain ratio: 1.89 — ABNORMAL HIGH (ref 0.26–1.65)
Lambda free light chains: 15.1 mg/L (ref 5.7–26.3)

## 2024-01-26 LAB — HOMOCYSTEINE: Homocysteine: 18.2 umol/L (ref 0.0–21.3)

## 2024-01-27 DIAGNOSIS — R059 Cough, unspecified: Secondary | ICD-10-CM | POA: Diagnosis not present

## 2024-01-27 DIAGNOSIS — U071 COVID-19: Secondary | ICD-10-CM | POA: Diagnosis not present

## 2024-01-27 DIAGNOSIS — Z20822 Contact with and (suspected) exposure to covid-19: Secondary | ICD-10-CM | POA: Diagnosis not present

## 2024-01-27 NOTE — Patient Instructions (Signed)
 [name]  01/27/2024     @PREFPERIOPPHARMACY @   Your procedure is scheduled on  02/01/2024.   Report to [facility] at  1230  P.M.   Call this number if you have problems the morning of surgery:  [phone]  If you experience any cold or flu symptoms such as cough, fever, chills, shortness of breath, etc. between now and your scheduled surgery, please notify us at the above number.   Remember:         Use your inhalers before you come and bring your rescu inhaler with you.    Follow the diet instructions given to you by the office.  You may drink clear liquids until 1030 am on 02/01/2024.    Clear liquids allowed are:                    Water, Juice (No red color; non-citric and without pulp; diabetics please choose diet or no sugar options), Carbonated beverages (diabetics please choose diet or no sugar options), Clear Tea (No creamer, milk, or cream, including half & half and powdered creamer), Black Coffee Only (No creamer, milk or cream, including half & half and powdered creamer), and Clear Sports drink (No red color; diabetics please choose diet or no sugar options)    Take these medicines the morning of surgery with A SIP OF WATER       amlodipine, esomeprazole, levothyroxine, tamsulosin.    Do not wear jewelry, make-up or nail polish, including gel polish,  artificial nails, or any other type of covering on natural nails (fingers and  toes).  Do not wear lotions, powders, or perfumes, or deodorant.  Do not shave 48 hours prior to surgery.  Men may shave face and neck.  Do not bring valuables to the hospital.  [facility] is not responsible for any belongings or valuables.  Contacts, dentures or bridgework may not be worn into surgery.  Leave your suitcase in the car.  After surgery it may be brought to your room.  For patients admitted to the hospital, discharge time will be determined by your treatment team.  Patients discharged the day of surgery  will not be allowed to drive home and must have someone with them for 24 hours.    Special instructions:  DO NOT smoke tobacco or vape for 24 hours before your procedure.   Please read over the following fact sheets that you were given. Anesthesia Post-op Instructions and Care and Recovery After Surgery      Upper Endoscopy, Adult, Care After After the procedure, it is common to have a sore throat. It is also common to have: Mild stomach pain or discomfort. Bloating. Nausea. Follow these instructions at home: The instructions below may help you care for yourself at home. Your health care provider may give you more instructions. If you have questions, ask your health care provider. If you were given a sedative during the procedure, it can affect you for several hours. Do not drive or operate machinery until your health care provider says that it is safe. If you will be going home right after the procedure, plan to have a responsible adult: Take you home from the hospital or clinic. You will not be allowed to drive. Care for you for the time you are told. Follow instructions from your health care provider about what you may eat and drink. Return to your normal activities as told by your health  care provider. Ask your health care provider what activities are safe for you. Take over-the-counter and prescription medicines only as told by your health care provider. Contact a health care provider if you: Have a sore throat that lasts longer than one day. Have trouble swallowing. Have a fever. Get help right away if you: Vomit blood or your vomit looks like coffee grounds. Have bloody, black, or tarry stools. Have a very bad sore throat or you cannot swallow. Have difficulty breathing or very bad pain in your chest or abdomen. These symptoms may be an emergency. Get help right away. Call 911. Do not wait to see if the symptoms will go away. Do not drive yourself to the  hospital. Summary After the procedure, it is common to have a sore throat, mild stomach discomfort, bloating, and nausea. If you were given a sedative during the procedure, it can affect you for several hours. Do not drive until your health care provider says that it is safe. Follow instructions from your health care provider about what you may eat and drink. Return to your normal activities as told by your health care provider. This information is not intended to replace advice given to you by your health care provider. Make sure you discuss any questions you have with your health care provider. Document Revised: 02/17/2022 Document Reviewed: 02/17/2022 Elsevier Patient Education  2024 [employer]Esophageal Dilatation Esophageal dilatation, or dilation, is done to stretch a blocked or narrowed part of your esophagus. The esophagus is the part of your body that moves food from your mouth to your stomach. You may need to have it stretched if: You have a lot of scar tissue and it makes it hard or painful to swallow. You have cancer of the esophagus. There's a problem with how food moves through your esophagus. In some cases, you may need to have this procedure done more than once. Tell a health care provider about: Any allergies you have. All medicines you're taking, including vitamins, herbs, eye drops, creams, and over-the-counter medicines. Any problems you or family members have had with anesthesia. Any bleeding problems you have. Any surgeries you've had. Any medical conditions you have. Whether you're pregnant or may be pregnant. What are the risks? Your health care provider will talk with you about risks. These may include: Bleeding. A hole or tear in your esophagus. What happens before the procedure? When to stop eating and drinking Follow instructions from your provider about what you may eat and drink. These may include: 8 hours before your procedure Stop eating most foods.  Do not eat meat, fried foods, or fatty foods. Eat only light foods, such as toast or crackers. All liquids are okay except energy drinks and alcohol. 6 hours before your procedure Stop eating. Drink only clear liquids, such as water, clear fruit juice, black coffee, plain tea, and sports drinks. Do not drink energy drinks or alcohol. 2 hours before your procedure Stop drinking all liquids. You may be allowed to take medicines with small sips of water. If you don't follow your provider's instructions, your procedure may be delayed or canceled. Medicines Ask your provider about: Changing or stopping your regular medicines. These include any diabetes medicines or blood thinners you take. Taking medicines such as aspirin and ibuprofen. These medicines can thin your blood. Do not take them unless your provider tells you to. Taking over-the-counter medicines, vitamins, herbs, and supplements. General instructions If you'll be going home right after the procedure, plan to have a  responsible adult: Take you home from the hospital or clinic. You won't be allowed to drive. Care for you for the time you're told. What happens during the procedure? You may be given: A sedative. This helps you relax. Anesthesia. This keeps you from feeling pain. It will numb certain areas of your body. The stretching may be done with: Simple dilators. These are tools put in your esophagus to stretch it. Guide wires. These wires are put in using a tube called an endoscope. A dilator is put over the wires to stretch your esophagus. Then the wires are taken out. A balloon. The balloon is on the end of a tube. It's inflated to stretch your esophagus. The procedure may vary among providers and hospitals. What can I expect after the procedure? Your blood pressure, heart rate, breathing rate, and blood oxygen level will be monitored until you leave the hospital or clinic. Your throat may feel sore and numb. This will get  better over time. You won't be allowed to eat or drink until your throat is no longer numb. You may be able to go home when you can: Drink. Pee. Sit on the edge of the bed without nausea or dizziness. Follow these instructions at home: Activity If you were given a sedative during the procedure, it can affect you for several hours. Do not drive or operate machinery until your provider says it's safe. Return to your normal activities as told by your provider. Ask your provider what activities are safe for you. General instructions Take over-the-counter and prescription medicines only as told by your provider. Follow instructions from your provider about what you may eat and drink. Do not use any products that contain nicotine or tobacco. These products include cigarettes, chewing tobacco, and vaping devices, such as e-cigarettes. If you need help quitting, ask your provider. Keep all follow-up visits. Your provider will make sure the procedure worked. Where to find more information American Society for Gastrointestinal Endoscopy (ASGE): asge.org Contact a health care provider if: You have trouble swallowing. You have a fever. Your pain doesn't get better with medicine. Get help right away if: You have chest pain. You have trouble breathing. You vomit blood. Your poop is: Black. Tarry. Bloody. These symptoms may be an emergency. Get help right away. Call 911. Do not wait to see if the symptoms will go away. Do not drive yourself to the hospital. This information is not intended to replace advice given to you by your health care provider. Make sure you discuss any questions you have with your health care provider. Document Revised: 02/04/2023 Document Reviewed: 02/04/2023 Elsevier Patient Education  2024 [employer]General Anesthesia, Adult, Care After The following information offers guidance on how to care for yourself after your procedure. Your health care provider may also give  you more specific instructions. If you have problems or questions, contact your health care provider. What can I expect after the procedure? After the procedure, it is common for people to: Have pain or discomfort at the IV site. Have nausea or vomiting. Have a sore throat or hoarseness. Have trouble concentrating. Feel cold or chills. Feel weak, sleepy, or tired (fatigue). Have soreness and body aches. These can affect parts of the body that were not involved in surgery. Follow these instructions at home: For the time period you were told by your health care provider:  Rest. Do not participate in activities where you could fall or become injured. Do not drive or use machinery. Do not drink  alcohol. Do not take sleeping pills or medicines that cause drowsiness. Do not make important decisions or sign legal documents. Do not take care of children on your own. General instructions Drink enough fluid to keep your urine pale yellow. If you have sleep apnea, surgery and certain medicines can increase your risk for breathing problems. Follow instructions from your health care provider about wearing your sleep device: Anytime you are sleeping, including during daytime naps. While taking prescription pain medicines, sleeping medicines, or medicines that make you drowsy. Return to your normal activities as told by your health care provider. Ask your health care provider what activities are safe for you. Take over-the-counter and prescription medicines only as told by your health care provider. Do not use any products that contain nicotine or tobacco. These products include cigarettes, chewing tobacco, and vaping devices, such as e-cigarettes. These can delay incision healing after surgery. If you need help quitting, ask your health care provider. Contact a health care provider if: You have nausea or vomiting that does not get better with medicine. You vomit every time you eat or drink. You have  pain that does not get better with medicine. You cannot urinate or have bloody urine. You develop a skin rash. You have a fever. Get help right away if: You have trouble breathing. You have chest pain. You vomit blood. These symptoms may be an emergency. Get help right away. Call 911. Do not wait to see if the symptoms will go away. Do not drive yourself to the hospital. Summary After the procedure, it is common to have a sore throat, hoarseness, nausea, vomiting, or to feel weak, sleepy, or fatigue. For the time period you were told by your health care provider, do not drive or use machinery. Get help right away if you have difficulty breathing, have chest pain, or vomit blood. These symptoms may be an emergency. This information is not intended to replace advice given to you by your health care provider. Make sure you discuss any questions you have with your health care provider. Document Revised: 02/05/2022 Document Reviewed: 02/05/2022 Elsevier Patient Education  2024 [employer].

## 2024-01-28 LAB — COPPER, SERUM: Copper: 60 ug/dL — ABNORMAL LOW (ref 69–132)

## 2024-01-29 LAB — PROTEIN ELECTROPHORESIS, SERUM
A/G Ratio: 1.3 (ref 0.7–1.7)
Albumin ELP: 3.8 g/dL (ref 2.9–4.4)
Alpha-1-Globulin: 0.3 g/dL (ref 0.0–0.4)
Alpha-2-Globulin: 0.7 g/dL (ref 0.4–1.0)
Beta Globulin: 0.9 g/dL (ref 0.7–1.3)
Gamma Globulin: 1 g/dL (ref 0.4–1.8)
Globulin, Total: 3 g/dL (ref 2.2–3.9)
Total Protein ELP: 6.8 g/dL (ref 6.0–8.5)

## 2024-01-30 ENCOUNTER — Telehealth: Payer: Self-pay | Admitting: *Deleted

## 2024-01-30 ENCOUNTER — Encounter (HOSPITAL_COMMUNITY): Payer: Self-pay

## 2024-01-30 ENCOUNTER — Encounter (HOSPITAL_COMMUNITY)
Admission: RE | Admit: 2024-01-30 | Discharge: 2024-01-30 | Disposition: A | Discharge status: Home / Self Care | Payer: Medicare Other | Source: Ambulatory Visit | Attending: Internal Medicine | Admitting: Internal Medicine

## 2024-01-30 DIAGNOSIS — I10 Essential (primary) hypertension: Secondary | ICD-10-CM

## 2024-01-30 LAB — METHYLMALONIC ACID, SERUM: Methylmalonic Acid, Quantitative: 171 nmol/L (ref 0–378)

## 2024-01-30 NOTE — Telephone Encounter (Signed)
 Patient called in and cancelled procedure for 3/12. Reports he was diagnosed with covid and he will call back at a later date to reschedule.

## 2024-01-30 NOTE — Telephone Encounter (Signed)
 Pt called back to confirm procedure had been cancelled. Informed pt that procedure had been cancelled. 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

## 2024-01-31 LAB — IMMUNOFIXATION ELECTROPHORESIS
IgA: 153 mg/dL (ref 61–437)
IgG (Immunoglobin G), Serum: 1133 mg/dL (ref 603–1613)
IgM (Immunoglobulin M), Srm: 38 mg/dL (ref 15–143)
Total Protein ELP: 6.8 g/dL (ref 6.0–8.5)

## 2024-02-01 ENCOUNTER — Encounter (HOSPITAL_COMMUNITY): Payer: Self-pay

## 2024-02-01 ENCOUNTER — Ambulatory Visit (HOSPITAL_COMMUNITY): Admit: 2024-02-01 | Payer: Medicare Other | Admitting: Internal Medicine

## 2024-02-01 SURGERY — ESOPHAGOGASTRODUODENOSCOPY (EGD) WITH PROPOFOL
Anesthesia: Choice

## 2024-02-07 ENCOUNTER — Inpatient Hospital Stay: Payer: Medicare Other | Admitting: Physician Assistant

## 2024-02-07 DIAGNOSIS — E559 Vitamin D deficiency, unspecified: Secondary | ICD-10-CM | POA: Diagnosis not present

## 2024-02-07 DIAGNOSIS — E039 Hypothyroidism, unspecified: Secondary | ICD-10-CM | POA: Diagnosis not present

## 2024-02-07 DIAGNOSIS — I10 Essential (primary) hypertension: Secondary | ICD-10-CM | POA: Diagnosis not present

## 2024-02-07 DIAGNOSIS — R7301 Impaired fasting glucose: Secondary | ICD-10-CM | POA: Diagnosis not present

## 2024-02-07 DIAGNOSIS — E782 Mixed hyperlipidemia: Secondary | ICD-10-CM | POA: Diagnosis not present

## 2024-02-10 DIAGNOSIS — R7301 Impaired fasting glucose: Secondary | ICD-10-CM | POA: Diagnosis not present

## 2024-02-10 DIAGNOSIS — J4530 Mild persistent asthma, uncomplicated: Secondary | ICD-10-CM | POA: Diagnosis not present

## 2024-02-10 DIAGNOSIS — E559 Vitamin D deficiency, unspecified: Secondary | ICD-10-CM | POA: Diagnosis not present

## 2024-02-10 DIAGNOSIS — N401 Enlarged prostate with lower urinary tract symptoms: Secondary | ICD-10-CM | POA: Diagnosis not present

## 2024-02-10 DIAGNOSIS — R718 Other abnormality of red blood cells: Secondary | ICD-10-CM | POA: Diagnosis not present

## 2024-02-10 DIAGNOSIS — E875 Hyperkalemia: Secondary | ICD-10-CM | POA: Diagnosis not present

## 2024-02-10 DIAGNOSIS — E039 Hypothyroidism, unspecified: Secondary | ICD-10-CM | POA: Diagnosis not present

## 2024-02-10 DIAGNOSIS — N1832 Chronic kidney disease, stage 3b: Secondary | ICD-10-CM | POA: Diagnosis not present

## 2024-02-10 DIAGNOSIS — E782 Mixed hyperlipidemia: Secondary | ICD-10-CM | POA: Diagnosis not present

## 2024-02-10 DIAGNOSIS — I129 Hypertensive chronic kidney disease with stage 1 through stage 4 chronic kidney disease, or unspecified chronic kidney disease: Secondary | ICD-10-CM | POA: Diagnosis not present

## 2024-02-10 DIAGNOSIS — D472 Monoclonal gammopathy: Secondary | ICD-10-CM | POA: Diagnosis not present

## 2024-02-14 NOTE — Progress Notes (Unsigned)
 [facility] [address], [place] [address]   CLINIC:  Medical Oncology/Hematology  PCP:  Benita Stabile, MD [address] [name] [address] [phone]   REASON FOR VISIT:  Evaluation of macrocytosis  CURRENT THERAPY: Under work-up  INTERVAL HISTORY:   [name] 83 y.o. male previously followed at our office for questionable MGUS, but was discharged from our clinic in May 2023, due to overall stability of polyclonal plasmacytosis (without evidence of clonal process).  He was referred back to [place] by his nephrology NP Johnn Hai (Dr. Lucio Edward office) for evaluation of mild macrocytosis.  At today's visit, he reports feeling well.  No recent hospitalizations, surgeries, or changes in baseline health status.  He denies any B symptoms such as fever, chills, night sweats, or unintentional weight loss. He denies any new onset bone pain or neurologic changes. He has not noticed any new masses or lymphadenopathy.  He denies any history of liver disease or thyroid disorders. He has copper deficiency, but denies any other known nutritional deficiencies.  He takes copper 2 mg twice daily supplements for the past year, in addition to Vitamin B12 5,000 mcg, and Vitamin D.  He does not take any zinc supplements. He denies any new medications.  He reports that he has been on triamterene for the past 7+ years. He does not drink any alcohol, except on rare occasions.  He has 100% energy and 100% appetite. He endorses that he is maintaining a stable weight.  ASSESSMENT & PLAN:  1.  Macrocytosis without anemia - Patient has had mild macrocytosis since 2013 - Labs sent by nephrology office (12/27/2023): Hgb 14.3/MCV 102 - Workup of macrocytosis (01/25/2024): CBC/D with Hgb 13.5/MCV 100.3.  No cytopenias. Folic acid low at 4.8.  Low copper at 60.  Normal homocystine, B12, MMA. Normal LDH, bilirubin, reticulocytes Minimally elevated FLC ratio 1.89 in keeping with  CKD.  Normal SPEP and immunofixation. Normal TSH - He does not drink alcohol.  No known history of liver disease or thyroid disorders. - Vitamin supplementation includes: Copper 2 mg twice daily.  Vitamin B12 5000 mcg daily.  Vitamin D. - Patient is not on any medications classically associated with macrocytosis, although triamterene has been associated with megaloblastic anemia and macrocytosis in some cases. - DIFFERENTIAL DIAGNOSIS: Macrocytosis secondary to folate deficiency Macrocytosis secondary to copper deficiency (although this more classically presents as microcytic or normocytic anemia) Macrocytosis secondary to triamterene Consider possibility of occult liver disease or fatty liver infiltration Less likely would be MDS or other bone marrow infiltrative disorder, given the long-term stability of macrocytosis without anemia. - PLAN: Increase copper supplementation to 4 mg twice daily.   - Start folic acid 400 mcg daily  - Check abdominal ultrasound, as macrocytosis can be caused by fatty liver disease.   - RTC in 4 months with labs checked 1 week prior to include CBC/D, folate, copper  2.  Polyclonal plasmacytosis - Patient was initially seen here in November 2015 due to elevated kappa-lambda light chain ratio of 5.07, in the setting of stage III chronic kidney disease and hypertension.  Serial SPEP was negative, no M spike.  Serial skeletal surveys were within normal limits, no lytic lesions of the bones. - Bone marrow biopsy was performed 01/13/2016 with mild plasmacytosis evidenced by plasma cells at 7% with a polyclonal staining pattern. - Discharged from cancer clinic in 2019, referred back to [place] in March 2022 by Dr. Wolfgang Phoenix due to elevated  calcium (10.5) in the setting of the above history  - 24-hour urine IFE/UPEP (April 2022) negative for M spike or monoclonal protein - Skeletal survey (03/12/2022): Negative for lytic lesions. - Most recent MGUS/myeloma panel from 01/25/2024  showed minimally elevated FLC ratio at 1.89, in keeping with CKD.  SPEP and immunofixation were normal. - No bone pain or B symptoms.   - PLAN: No further hematology workup at this time.  Can be checked as needed.  3.  Social / Family History - [name] live at home with his wife, and leads a very active lifestyle.  He worked in the [employer], also served in [employer].   - He is a lifelong nonsmoker.  Denies excessive alcohol intake and illicit drug use. - Mother deceased from MI in March 19, 1981 related to anaphylaxis.  Sister deceased due to lung cancer.  Brother deceased from MI related to drug use.  Father deceased in his late 109's from COPD  PLAN SUMMARY: >> Abdominal ultrasound (next available) >> Labs in 4 months = CBC/D, folate, copper >> OFFICE visit in 4 months (1 week after labs)     REVIEW OF SYSTEMS:   Review of Systems  Constitutional:  Negative for appetite change, chills, diaphoresis, fatigue, fever and unexpected weight change.  HENT:   Negative for lump/mass and nosebleeds.   Eyes:  Negative for eye problems.  Respiratory:  Negative for cough, hemoptysis and shortness of breath.   Cardiovascular:  Negative for chest pain, leg swelling and palpitations.  Gastrointestinal:  Negative for abdominal pain, blood in stool, constipation, diarrhea, nausea and vomiting.  Genitourinary:  Negative for hematuria.   Skin: Negative.   Neurological:  Positive for dizziness. Negative for headaches and light-headedness.  Hematological:  Does not bruise/bleed easily.     PHYSICAL EXAM:  ECOG PERFORMANCE STATUS: 0 - Asymptomatic  Vitals:   02/15/24 0820  BP: 136/76  Pulse: 81  Resp: 18  Temp: (!) 97 F (36.1 C)  SpO2: 100%   Filed Weights   02/15/24 0820  Weight: 199 lb (90.3 kg)   Physical Exam Constitutional:      Appearance: Normal appearance. He is obese.  Cardiovascular:     Heart sounds: Normal heart sounds.  Pulmonary:     Breath sounds: Wheezing (faint)  present.  Neurological:     General: No focal deficit present.     Mental Status: Mental status is at baseline.  Psychiatric:        Behavior: Behavior normal. Behavior is cooperative.    PAST MEDICAL/SURGICAL HISTORY:  Past Medical History:  Diagnosis Date   Asthma    Blood transfusion abn reaction or complication, no procedure mishap 1979   reaction due to wrong blood type given   GERD (gastroesophageal reflux disease)    occasional   H/O: pneumonia 02/27/2015   Hyperlipidemia    Hypertension    Hypothyroidism    MGUS (monoclonal gammopathy of unknown significance)    Myocardial infarction (HCC)    hx of abnormal ekg showed prior myocardial infarction   Renal disorder    Vitamin B 12 deficiency 09/30/2016   Past Surgical History:  Procedure Laterality Date   APPENDECTOMY  1963   CHOLECYSTECTOMY  1979   COLONOSCOPY  03/28/2008   ZOX:[id number] rectum; left-sided transverse diverticula diminutive polyp; descending colon. Adenomas.   COLONOSCOPY N/A 06/07/2013   Procedure: COLONOSCOPY;  Surgeon: Corbin Ade, MD;  Location: [facility];  Service: Endoscopy;  Laterality: N/A;  9:45  COLONOSCOPY WITH PROPOFOL N/A 06/01/2021   Procedure: COLONOSCOPY WITH PROPOFOL;  Surgeon: Corbin Ade, MD;  Location: [facility];  Service: Endoscopy;  Laterality: N/A;  12:15pm   ESOPHAGOGASTRODUODENOSCOPY N/A 02/25/2021   Procedure: ESOPHAGOGASTRODUODENOSCOPY (EGD);  Surgeon: Corbin Ade, MD;  Location: [facility];  Service: Endoscopy;  Laterality: N/A;  pm appt   HERNIA REPAIR  2005   LUMBAR LAMINECTOMY/DECOMPRESSION MICRODISCECTOMY  07/26/2012   Procedure: LUMBAR LAMINECTOMY/DECOMPRESSION MICRODISCECTOMY;  Surgeon: Javier Docker, MD;  Location: [facility];  Service: Orthopedics;  Laterality: N/A;  Lumbar Decompression L4-L5   MALONEY DILATION N/A 02/25/2021   Procedure: Elease Hashimoto DILATION;  Surgeon: Corbin Ade, MD;  Location: [facility];  Service: Endoscopy;  Laterality: N/A;    POLYPECTOMY  06/01/2021   Procedure: POLYPECTOMY INTESTINAL;  Surgeon: Corbin Ade, MD;  Location: [facility];  Service: Endoscopy;;   STOMACH SURGERY  2003   exploratory surgery, fatty tumors with obstruction?   TONSILLECTOMY  1962    SOCIAL HISTORY:  Social History   Socioeconomic History   Marital status: Married    Spouse name: Not on file   Number of children: Not on file   Years of education: Not on file   Highest education level: Not on file  Occupational History   Not on file  Tobacco Use   Smoking status: Never   Smokeless tobacco: Never  Vaping Use   Vaping status: Never Used  Substance and Sexual Activity   Alcohol use: Yes    Comment: occasional   Drug use: No   Sexual activity: Never  Other Topics Concern   Not on file  Social History Narrative   Not on file   Social Drivers of Health   Financial Resource Strain: Low Risk  (03/13/2021)   Overall Financial Resource Strain (CARDIA)    Difficulty of Paying Living Expenses: Not hard at all  Food Insecurity: No Food Insecurity (03/13/2021)   Hunger Vital Sign    Worried About Running Out of Food in the Last Year: Never true    Ran Out of Food in the Last Year: Never true  Transportation Needs: No Transportation Needs (03/13/2021)   PRAPARE - Administrator, Civil Service (Medical): No    Lack of Transportation (Non-Medical): No  Physical Activity: Sufficiently Active (03/13/2021)   Exercise Vital Sign    Days of Exercise per Week: 7 days    Minutes of Exercise per Session: 60 min  Stress: No Stress Concern Present (03/13/2021)   [employer] of Occupational Health - Occupational Stress Questionnaire    Feeling of Stress : Not at all  Social Connections: Moderately Isolated (03/13/2021)   Social Connection and Isolation Panel [NHANES]    Frequency of Communication with Friends and Family: More than three times a week    Frequency of Social Gatherings with Friends and Family: Once a week     Attends Religious Services: Never    Database administrator or Organizations: No    Attends Banker Meetings: Never    Marital Status: Married  Catering manager Violence: Not At Risk (03/13/2021)   Humiliation, Afraid, Rape, and Kick questionnaire    Fear of Current or Ex-Partner: No    Emotionally Abused: No    Physically Abused: No    Sexually Abused: No    FAMILY HISTORY:  Family History  Problem Relation Age of Onset   Thyroid disease Mother    Heart disease Mother  Hypertension Mother    Clotting disorder Father    Hypertension Father    Cancer Sister    Hypertension Sister    Colon cancer Neg Hx     CURRENT MEDICATIONS:  Outpatient Encounter Medications as of 02/15/2024  Medication Sig Note   acetaminophen (TYLENOL) 500 MG tablet Take 500 mg by mouth every 6 (six) hours as needed.    amLODipine (NORVASC) 5 MG tablet Take 5 mg by mouth daily.    aspirin EC 81 MG tablet Take 81 mg by mouth daily.    Cholecalciferol (VITAMIN D3) 50 MCG (2000 UT) capsule Take 2,000 Units by mouth daily.    copper tablet Take 4 mg by mouth daily.    Cyanocobalamin (VITAMIN B-12) 5000 MCG TBDP Take 5,000 mcg by mouth daily.    diphenhydrAMINE (BENADRYL) 25 mg capsule Take 25 mg by mouth every 6 (six) hours as needed.    esomeprazole (NEXIUM) 40 MG capsule Take 1 capsule (40 mg total) by mouth daily at 12 noon.    Fluticasone-Salmeterol (WIXELA INHUB IN) Inhale 1 puff into the lungs daily as needed (Allergies and Asthma).    guaiFENesin (MUCINEX) 600 MG 12 hr tablet Take 600 mg by mouth every other day. 08/02/2023: As needed   levothyroxine (SYNTHROID, LEVOTHROID) 75 MCG tablet Take 75 mcg by mouth daily before breakfast.    simvastatin (ZOCOR) 40 MG tablet Take 40 mg by mouth at bedtime.    tamsulosin (FLOMAX) 0.4 MG CAPS capsule Take 0.4 mg by mouth daily.    triamterene-hydrochlorothiazide (DYAZIDE) 37.5-25 MG capsule Take 1 capsule by mouth daily.    No  facility-administered encounter medications on file as of 02/15/2024.    ALLERGIES:  Allergies  Allergen Reactions   Meperidine And Related Other (See Comments)    Makes him feel like he is having a heart attack   Protonix [Pantoprazole] Nausea Only and Other (See Comments)    headaches   Sulfa Antibiotics Rash    LABORATORY DATA:  I have reviewed the labs as listed.  CBC    Component Value Date/Time   WBC 5.8 01/25/2024 1425   RBC 3.77 (L) 01/25/2024 1426   RBC 3.77 (L) 01/25/2024 1425   HGB 13.5 01/25/2024 1425   HCT 37.8 (L) 01/25/2024 1425   PLT 263 01/25/2024 1425   MCV 100.3 (H) 01/25/2024 1425   MCH 35.8 (H) 01/25/2024 1425   MCHC 35.7 01/25/2024 1425   RDW 13.2 01/25/2024 1425   LYMPHSABS 0.9 01/25/2024 1425   MONOABS 0.9 01/25/2024 1425   EOSABS 0.2 01/25/2024 1425   BASOSABS 0.1 01/25/2024 1425      Latest Ref Rng & Units 01/25/2024    2:25 PM 03/12/2022    8:31 AM 05/27/2021    8:52 AM  CMP  Glucose 70 - 99 mg/dL 102  725  366   BUN 8 - 23 mg/dL 12  14  15    Creatinine 0.61 - 1.24 mg/dL 4.40  3.47  4.25   Sodium 135 - 145 mmol/L 126  132  132   Potassium 3.5 - 5.1 mmol/L 3.5  3.7  3.3   Chloride 98 - 111 mmol/L 89  92  94   CO2 22 - 32 mmol/L 27  30  27    Calcium 8.9 - 10.3 mg/dL 9.1  9.1  9.4   Total Protein 6.5 - 8.1 g/dL 7.3  7.8    Total Bilirubin 0.0 - 1.2 mg/dL 0.6  0.8    Alkaline Phos  38 - 126 U/L 56  77    AST 15 - 41 U/L 29  30    ALT 0 - 44 U/L 15  22      DIAGNOSTIC IMAGING:  I have independently reviewed the relevant imaging and discussed with the patient.   WRAP UP:  All questions were answered. The patient knows to call the clinic with any problems, questions or concerns.  Medical decision making: Moderate  Time spent on visit: I spent 20 minutes counseling the patient face to face. The total time spent in the appointment was 30 minutes and more than 50% was on counseling.  Carnella Guadalajara, PA-C  02/15/24 9:00 AM

## 2024-02-15 ENCOUNTER — Inpatient Hospital Stay (HOSPITAL_BASED_OUTPATIENT_CLINIC_OR_DEPARTMENT_OTHER): Admitting: Physician Assistant

## 2024-02-15 VITALS — BP 136/76 | HR 81 | Temp 97.0°F | Resp 18 | Ht 70.5 in | Wt 199.0 lb

## 2024-02-15 DIAGNOSIS — R768 Other specified abnormal immunological findings in serum: Secondary | ICD-10-CM

## 2024-02-15 DIAGNOSIS — Z79899 Other long term (current) drug therapy: Secondary | ICD-10-CM | POA: Diagnosis not present

## 2024-02-15 DIAGNOSIS — D72822 Plasmacytosis: Secondary | ICD-10-CM | POA: Diagnosis not present

## 2024-02-15 DIAGNOSIS — N183 Chronic kidney disease, stage 3 unspecified: Secondary | ICD-10-CM | POA: Diagnosis not present

## 2024-02-15 DIAGNOSIS — D7589 Other specified diseases of blood and blood-forming organs: Secondary | ICD-10-CM | POA: Diagnosis not present

## 2024-02-15 DIAGNOSIS — I129 Hypertensive chronic kidney disease with stage 1 through stage 4 chronic kidney disease, or unspecified chronic kidney disease: Secondary | ICD-10-CM | POA: Diagnosis not present

## 2024-02-15 DIAGNOSIS — Z79620 Long term (current) use of immunosuppressive biologic: Secondary | ICD-10-CM | POA: Diagnosis not present

## 2024-02-15 MED ORDER — FOLIC ACID 400 MCG PO TABS: 400.0000 ug | ORAL_TABLET | Freq: Every day | ORAL | 3 refills | Status: AC

## 2024-02-15 NOTE — Addendum Note (Signed)
 Addended by: Rojelio Brenner on: 02/15/2024 09:07 AM   Modules accepted: Orders

## 2024-02-15 NOTE — Patient Instructions (Signed)
 [facility] at [facility] **VISIT SUMMARY & IMPORTANT INSTRUCTIONS **   You were seen today by Rojelio Brenner PA-C for your large red blood cells ("macrocytosis").    MACROCYTOSIS: You have had slightly large red blood cells since at least 2015.  This can be caused by a variety of factors. We do NOT suspect any cancerous blood conditions at this time, based on the fact that your red blood cells have been large but stable for the past 10 years, and you do not have any associated anemia, low platelets, or low white blood cells. Your large red blood cells may be caused by one of the following: Low copper levels Low folate levels Possible liver disorders (?) Medication side effect (triamterene)  ABNORMAL PROTEIN: You were previously seen in our clinic for abnormal protein.  Your most recent labs did NOT show any evidence of this abnormal protein.         PLAN SUMMARY: Check abdominal ultrasound Increase copper to take 4 mg twice daily Start taking folic acid 400 mcg daily Labs in 4 months OFFICE visit in 4 months, 1 week after labs      ** Thank you for trusting me with your healthcare!  I strive to provide all of my patients with quality care at each visit.  If you receive a survey for this visit, I would be so grateful to you for taking the time to provide feedback.  Thank you in advance!  ~ Emari Hreha                   Dr. Doreatha Massed   &   Rojelio Brenner, PA-C   - - - - - - - - - - - - - - - - - -    Thank you for choosing [facility] at [facility] to provide your oncology and hematology care.  To afford each patient quality time with our provider, please arrive at least 15 minutes before your scheduled appointment time.   If you have a lab appointment with the Cancer Center please come in thru the Main Entrance and check in at the main information desk.  You need to re-schedule your appointment should you arrive 10 or  more minutes late.  We strive to give you quality time with our providers, and arriving late affects you and other patients whose appointments are after yours.  Also, if you no show three or more times for appointments you may be dismissed from the clinic at the providers discretion.     Again, thank you for choosing [facility].  Our hope is that these requests will decrease the amount of time that you wait before being seen by our physicians.       _____________________________________________________________  Should you have questions after your visit to [facility], please contact our office at [phone] and follow the prompts.  Our office hours are 8:00 a.m. and 4:30 p.m. Monday - Friday.  Please note that voicemails left after 4:00 p.m. may not be returned until the following business day.  We are closed weekends and major holidays.  You do have access to a nurse 24-7, just call the main number to the clinic [phone] and do not press any options, hold on the line and a nurse will answer the phone.    For prescription refill requests, have your pharmacy contact our office and allow 72 hours.

## 2024-02-24 ENCOUNTER — Ambulatory Visit (HOSPITAL_COMMUNITY)
Admission: RE | Admit: 2024-02-24 | Discharge: 2024-02-24 | Disposition: A | Discharge status: Home / Self Care | Source: Ambulatory Visit | Attending: Physician Assistant | Admitting: Physician Assistant

## 2024-02-24 DIAGNOSIS — D7589 Other specified diseases of blood and blood-forming organs: Secondary | ICD-10-CM | POA: Diagnosis not present

## 2024-02-24 DIAGNOSIS — Z9049 Acquired absence of other specified parts of digestive tract: Secondary | ICD-10-CM | POA: Diagnosis not present

## 2024-03-22 DIAGNOSIS — D2230 Melanocytic nevi of unspecified part of face: Secondary | ICD-10-CM | POA: Diagnosis not present

## 2024-03-22 DIAGNOSIS — L570 Actinic keratosis: Secondary | ICD-10-CM | POA: Diagnosis not present

## 2024-03-22 DIAGNOSIS — L821 Other seborrheic keratosis: Secondary | ICD-10-CM | POA: Diagnosis not present

## 2024-03-22 DIAGNOSIS — L578 Other skin changes due to chronic exposure to nonionizing radiation: Secondary | ICD-10-CM | POA: Diagnosis not present

## 2024-03-22 DIAGNOSIS — Z85828 Personal history of other malignant neoplasm of skin: Secondary | ICD-10-CM | POA: Diagnosis not present

## 2024-03-22 DIAGNOSIS — Z86018 Personal history of other benign neoplasm: Secondary | ICD-10-CM | POA: Diagnosis not present

## 2024-03-22 DIAGNOSIS — D225 Melanocytic nevi of trunk: Secondary | ICD-10-CM | POA: Diagnosis not present

## 2024-03-26 DIAGNOSIS — H35363 Drusen (degenerative) of macula, bilateral: Secondary | ICD-10-CM | POA: Diagnosis not present

## 2024-03-29 DIAGNOSIS — R051 Acute cough: Secondary | ICD-10-CM | POA: Diagnosis not present

## 2024-03-29 DIAGNOSIS — J4521 Mild intermittent asthma with (acute) exacerbation: Secondary | ICD-10-CM | POA: Diagnosis not present

## 2024-04-24 DIAGNOSIS — E871 Hypo-osmolality and hyponatremia: Secondary | ICD-10-CM | POA: Diagnosis not present

## 2024-04-24 DIAGNOSIS — E610 Copper deficiency: Secondary | ICD-10-CM | POA: Diagnosis not present

## 2024-04-24 DIAGNOSIS — R809 Proteinuria, unspecified: Secondary | ICD-10-CM | POA: Diagnosis not present

## 2024-04-24 DIAGNOSIS — D539 Nutritional anemia, unspecified: Secondary | ICD-10-CM | POA: Diagnosis not present

## 2024-04-24 DIAGNOSIS — N189 Chronic kidney disease, unspecified: Secondary | ICD-10-CM | POA: Diagnosis not present

## 2024-04-24 DIAGNOSIS — D7589 Other specified diseases of blood and blood-forming organs: Secondary | ICD-10-CM | POA: Diagnosis not present

## 2024-04-24 DIAGNOSIS — N1831 Chronic kidney disease, stage 3a: Secondary | ICD-10-CM | POA: Diagnosis not present

## 2024-04-24 DIAGNOSIS — D631 Anemia in chronic kidney disease: Secondary | ICD-10-CM | POA: Diagnosis not present

## 2024-05-01 DIAGNOSIS — D7589 Other specified diseases of blood and blood-forming organs: Secondary | ICD-10-CM | POA: Diagnosis not present

## 2024-05-01 DIAGNOSIS — N1831 Chronic kidney disease, stage 3a: Secondary | ICD-10-CM | POA: Diagnosis not present

## 2024-05-01 DIAGNOSIS — E610 Copper deficiency: Secondary | ICD-10-CM | POA: Diagnosis not present

## 2024-05-01 DIAGNOSIS — R809 Proteinuria, unspecified: Secondary | ICD-10-CM | POA: Diagnosis not present

## 2024-06-12 ENCOUNTER — Inpatient Hospital Stay: Attending: Physician Assistant

## 2024-06-12 DIAGNOSIS — Z801 Family history of malignant neoplasm of trachea, bronchus and lung: Secondary | ICD-10-CM | POA: Diagnosis not present

## 2024-06-12 DIAGNOSIS — Z79899 Other long term (current) drug therapy: Secondary | ICD-10-CM | POA: Diagnosis not present

## 2024-06-12 DIAGNOSIS — D72822 Plasmacytosis: Secondary | ICD-10-CM | POA: Insufficient documentation

## 2024-06-12 DIAGNOSIS — N183 Chronic kidney disease, stage 3 unspecified: Secondary | ICD-10-CM | POA: Diagnosis not present

## 2024-06-12 DIAGNOSIS — D7589 Other specified diseases of blood and blood-forming organs: Secondary | ICD-10-CM | POA: Insufficient documentation

## 2024-06-12 DIAGNOSIS — I129 Hypertensive chronic kidney disease with stage 1 through stage 4 chronic kidney disease, or unspecified chronic kidney disease: Secondary | ICD-10-CM | POA: Insufficient documentation

## 2024-06-12 LAB — CBC WITH DIFFERENTIAL/PLATELET
Abs Immature Granulocytes: 0.03 K/uL (ref 0.00–0.07)
Basophils Absolute: 0.1 K/uL (ref 0.0–0.1)
Basophils Relative: 1 %
Eosinophils Absolute: 0.5 K/uL (ref 0.0–0.5)
Eosinophils Relative: 6 %
HCT: 40.9 % (ref 39.0–52.0)
Hemoglobin: 14.4 g/dL (ref 13.0–17.0)
Immature Granulocytes: 0 %
Lymphocytes Relative: 27 %
Lymphs Abs: 2.3 K/uL (ref 0.7–4.0)
MCH: 36.5 pg — ABNORMAL HIGH (ref 26.0–34.0)
MCHC: 35.2 g/dL (ref 30.0–36.0)
MCV: 103.5 fL — ABNORMAL HIGH (ref 80.0–100.0)
Monocytes Absolute: 0.9 K/uL (ref 0.1–1.0)
Monocytes Relative: 11 %
Neutro Abs: 4.9 K/uL (ref 1.7–7.7)
Neutrophils Relative %: 55 %
Platelets: 345 K/uL (ref 150–400)
RBC: 3.95 MIL/uL — ABNORMAL LOW (ref 4.22–5.81)
RDW: 12.3 % (ref 11.5–15.5)
WBC: 8.7 K/uL (ref 4.0–10.5)
nRBC: 0 % (ref 0.0–0.2)

## 2024-06-12 LAB — FOLATE: Folate: 20.5 ng/mL (ref >5.9)

## 2024-06-13 LAB — COPPER, SERUM: Copper: 77 ug/dL (ref 69–132)

## 2024-06-18 NOTE — Progress Notes (Unsigned)
 [facility] [address] [place], [place] [address]   CLINIC:  Medical Oncology/Hematology  PCP:  Shona Norleen PEDLAR, MD [address] [place] [address] [phone]   REASON FOR VISIT:  Evaluation of macrocytosis  CURRENT THERAPY: Surveillance  INTERVAL HISTORY:   [name] 83 y.o. male returns for follow-up of mild macrocytosis.  He was last seen by Pleasant Barefoot PA-C on 02/15/2024.  At today's visit, he reports feeling fairly well.  No recent hospitalizations, surgeries, or changes in baseline health status.  He denies any B symptoms such as fever, chills, night sweats, or unintentional weight loss. He denies any new onset bone pain or neurologic changes. He has not noticed any new masses or lymphadenopathy.  He continues to take copper  4 mg twice daily,  folic acid  400 mcg daily, vitamin B12 500 mcg daily, and vitamin D .  He has 80% energy and 100% appetite. He endorses that he is maintaining a stable weight.  ASSESSMENT & PLAN:  1.  Macrocytosis without anemia - Patient has had mild macrocytosis since 2013 - Labs sent by nephrology office (12/27/2023): Hgb 14.3/MCV 102 - Workup of macrocytosis (01/25/2024): CBC/D with Hgb 13.5/MCV 100.3.  No cytopenias. Folic acid  low at 4.8.  Low copper  at 60.  Normal homocystine, B12, MMA. Normal LDH, bilirubin, reticulocytes Minimally elevated FLC ratio 1.89 in keeping with CKD.  Normal SPEP and immunofixation. Normal TSH - He does not drink alcohol.  No known history of liver disease or thyroid  disorders. - Vitamin supplementation includes: Copper  4 mg twice daily.  Folic acid  400 mcg daily.  Vitamin B12 5000 mcg daily.  Vitamin D . - Abdominal US  (02/24/2024): Increased echotexture, as can be seen in fatty infiltration of the liver.  Normal spleen. - Most recent labs (06/12/2024): Hgb 14.4/MCV 103.5.  Folate and copper  have normalized. - Patient is not on any medications classically associated with macrocytosis,  although triamterene  has been associated with megaloblastic anemia and macrocytosis in some cases. - DIFFERENTIAL DIAGNOSIS: Persistent macrocytosis despite resolution of folate and copper  deficiency. Macrocytosis secondary to triamterene  Macrocytosis secondary to fatty liver infiltration Less likely would be MDS or other bone marrow infiltrative disorder, given the long-term stability of macrocytosis without anemia. - PLAN: Mild macrocytosis likely secondary to fatty liver infiltration and/or triamterene . - Recommend discharge to PCP at this time, but recommend that he would be referred back to us  if he develops any cytopenias in addition to macrocytosis. - Recommend to continue current supplementation including copper  4 mg twice daily and folic acid  400 mcg every other day  (instead of daily).  2.  Polyclonal plasmacytosis - Patient was initially seen here in November 2015 due to elevated kappa-lambda light chain ratio of 5.07, in the setting of stage III chronic kidney disease and hypertension.  Serial SPEP was negative, no M spike.  Serial skeletal surveys were within normal limits, no lytic lesions of the bones. - Bone marrow biopsy was performed 01/13/2016 with mild plasmacytosis evidenced by plasma cells at 7% with a polyclonal staining pattern. - Discharged from cancer clinic in 2019, referred back to us  in March 2022 by Dr. Rachele due to elevated calcium (10.5) in the setting of the above history  - 24-hour urine IFE/UPEP (April 2022) negative for M spike or monoclonal protein - Skeletal survey (03/12/2022): Negative for lytic lesions. - Most recent MGUS/myeloma panel from 01/25/2024 showed minimally elevated FLC ratio at 1.89, in keeping with CKD.  SPEP and immunofixation were normal. -  No bone pain or B symptoms.   - PLAN: No further hematology workup at this time.  Can be checked as needed.  3.  Social / Family History - [name] live at home with his wife, and leads a very active  lifestyle.  He worked in the [employer], also served in [employer].   - He is a lifelong nonsmoker.  Denies excessive alcohol intake and illicit drug use. - Mother deceased from MI in 1981/06/29 related to anaphylaxis.  Sister deceased due to lung cancer.  Brother deceased from MI related to drug use.  Father deceased in his late 49's from COPD  PLAN SUMMARY:  Discharge to PCP      REVIEW OF SYSTEMS:   Review of Systems  Constitutional:  Negative for appetite change, chills, diaphoresis, fatigue, fever and unexpected weight change.  HENT:   Negative for lump/mass and nosebleeds.   Eyes:  Negative for eye problems.  Respiratory:  Positive for shortness of breath (with exertion). Negative for cough and hemoptysis.   Cardiovascular:  Negative for chest pain, leg swelling and palpitations.  Gastrointestinal:  Negative for abdominal pain, blood in stool, constipation, diarrhea, nausea and vomiting.  Genitourinary:  Positive for frequency and nocturia. Negative for hematuria.   Musculoskeletal:  Positive for back pain.  Skin: Negative.   Neurological:  Positive for dizziness. Negative for headaches and light-headedness.  Hematological:  Does not bruise/bleed easily.  Psychiatric/Behavioral:  The patient is nervous/anxious (at times).      PHYSICAL EXAM:  ECOG PERFORMANCE STATUS: 0 - Asymptomatic  Vitals:   06/19/24 0932  BP: 120/65  Pulse: 77  Resp: 19  Temp: 97.7 F (36.5 C)  SpO2: 96%    Filed Weights   06/19/24 0932  Weight: 198 lb 12.8 oz (90.2 kg)    Physical Exam Constitutional:      Appearance: Normal appearance. He is obese.  Cardiovascular:     Heart sounds: Normal heart sounds.  Pulmonary:     Effort: Pulmonary effort is normal.  Neurological:     General: No focal deficit present.     Mental Status: Mental status is at baseline.  Psychiatric:        Behavior: Behavior normal. Behavior is cooperative.    PAST MEDICAL/SURGICAL HISTORY:  Past Medical  History:  Diagnosis Date   Asthma    Blood transfusion abn reaction or complication, no procedure mishap 1979   reaction due to wrong blood type given   GERD (gastroesophageal reflux disease)    occasional   H/O: pneumonia 02/27/2015   Hyperlipidemia    Hypertension    Hypothyroidism    MGUS (monoclonal gammopathy of unknown significance)    Myocardial infarction (HCC)    hx of abnormal ekg showed prior myocardial infarction   Renal disorder    Vitamin B 12 deficiency 09/30/2016   Past Surgical History:  Procedure Laterality Date   APPENDECTOMY  1963   CHOLECYSTECTOMY  1979   COLONOSCOPY  03/28/2008   MFM:Wnmfjo rectum; left-sided transverse diverticula diminutive polyp; descending colon. Adenomas.   COLONOSCOPY N/A 06/07/2013   Procedure: COLONOSCOPY;  Surgeon: Lamar CHRISTELLA Hollingshead, MD;  Location: [facility];  Service: Endoscopy;  Laterality: N/A;  9:45   COLONOSCOPY WITH PROPOFOL  N/A 06/01/2021   Procedure: COLONOSCOPY WITH PROPOFOL ;  Surgeon: Hollingshead Lamar CHRISTELLA, MD;  Location: [facility];  Service: Endoscopy;  Laterality: N/A;  12:15pm   ESOPHAGOGASTRODUODENOSCOPY N/A 02/25/2021   Procedure: ESOPHAGOGASTRODUODENOSCOPY (EGD);  Surgeon: Hollingshead Lamar  M, MD;  Location: [facility];  Service: Endoscopy;  Laterality: N/A;  pm appt   HERNIA REPAIR  2005   LUMBAR LAMINECTOMY/DECOMPRESSION MICRODISCECTOMY  07/26/2012   Procedure: LUMBAR LAMINECTOMY/DECOMPRESSION MICRODISCECTOMY;  Surgeon: Reyes JAYSON Billing, MD;  Location: [facility];  Service: Orthopedics;  Laterality: N/A;  Lumbar Decompression L4-L5   MALONEY DILATION N/A 02/25/2021   Procedure: AGAPITO DILATION;  Surgeon: Shaaron Lamar HERO, MD;  Location: [facility];  Service: Endoscopy;  Laterality: N/A;   POLYPECTOMY  06/01/2021   Procedure: POLYPECTOMY INTESTINAL;  Surgeon: Shaaron Lamar HERO, MD;  Location: [facility];  Service: Endoscopy;;   STOMACH SURGERY  2003   exploratory surgery, fatty tumors with obstruction?   TONSILLECTOMY  1962     SOCIAL HISTORY:  Social History   Socioeconomic History   Marital status: Married    Spouse name: Not on file   Number of children: Not on file   Years of education: Not on file   Highest education level: Not on file  Occupational History   Not on file  Tobacco Use   Smoking status: Never   Smokeless tobacco: Never  Vaping Use   Vaping status: Never Used  Substance and Sexual Activity   Alcohol use: Yes    Comment: occasional   Drug use: No   Sexual activity: Never  Other Topics Concern   Not on file  Social History Narrative   Not on file   Social Drivers of Health   Financial Resource Strain: Low Risk  (03/13/2021)   Overall Financial Resource Strain (CARDIA)    Difficulty of Paying Living Expenses: Not hard at all  Food Insecurity: No Food Insecurity (03/13/2021)   Hunger Vital Sign    Worried About Running Out of Food in the Last Year: Never true    Ran Out of Food in the Last Year: Never true  Transportation Needs: No Transportation Needs (03/13/2021)   PRAPARE - Administrator, Civil Service (Medical): No    Lack of Transportation (Non-Medical): No  Physical Activity: Sufficiently Active (03/13/2021)   Exercise Vital Sign    Days of Exercise per Week: 7 days    Minutes of Exercise per Session: 60 min  Stress: No Stress Concern Present (03/13/2021)   [employer] of Occupational Health - Occupational Stress Questionnaire    Feeling of Stress : Not at all  Social Connections: Moderately Isolated (03/13/2021)   Social Connection and Isolation Panel    Frequency of Communication with Friends and Family: More than three times a week    Frequency of Social Gatherings with Friends and Family: Once a week    Attends Religious Services: Never    Database administrator or Organizations: No    Attends Banker Meetings: Never    Marital Status: Married  Catering manager Violence: Not At Risk (03/13/2021)   Humiliation, Afraid, Rape, and  Kick questionnaire    Fear of Current or Ex-Partner: No    Emotionally Abused: No    Physically Abused: No    Sexually Abused: No    FAMILY HISTORY:  Family History  Problem Relation Age of Onset   Thyroid  disease Mother    Heart disease Mother    Hypertension Mother    Clotting disorder Father    Hypertension Father    Cancer Sister    Hypertension Sister    Colon cancer Neg Hx     CURRENT MEDICATIONS:  Outpatient Encounter Medications as of 06/19/2024  Medication Sig Note   acetaminophen  (TYLENOL ) 500 MG tablet Take 500 mg by mouth every 6 (six) hours as needed.    amLODipine  (NORVASC ) 5 MG tablet Take 5 mg by mouth daily.    aspirin EC 81 MG tablet Take 81 mg by mouth daily.    Cholecalciferol (VITAMIN D3) 50 MCG (2000 UT) capsule Take 2,000 Units by mouth daily.    copper  tablet Take 4 mg by mouth daily.    Cyanocobalamin  (VITAMIN B-12) 5000 MCG TBDP Take 5,000 mcg by mouth daily.    diphenhydrAMINE (BENADRYL) 25 mg capsule Take 25 mg by mouth every 6 (six) hours as needed.    esomeprazole  (NEXIUM ) 40 MG capsule Take 1 capsule (40 mg total) by mouth daily at 12 noon.    Fluticasone-Salmeterol (WIXELA INHUB IN) Inhale 1 puff into the lungs daily as needed (Allergies and Asthma).    folic acid  (V-R FOLIC ACID ) 400 MCG tablet Take 1 tablet (400 mcg total) by mouth daily.    guaiFENesin (MUCINEX) 600 MG 12 hr tablet Take 600 mg by mouth every other day. 08/02/2023: As needed   levothyroxine  (SYNTHROID , LEVOTHROID) 75 MCG tablet Take 75 mcg by mouth daily before breakfast.    simvastatin (ZOCOR) 40 MG tablet Take 40 mg by mouth at bedtime.    tamsulosin (FLOMAX) 0.4 MG CAPS capsule Take 0.4 mg by mouth daily.    triamterene -hydrochlorothiazide (DYAZIDE) 37.5-25 MG capsule Take 1 capsule by mouth daily.    No facility-administered encounter medications on file as of 06/19/2024.    ALLERGIES:  Allergies  Allergen Reactions   Meperidine  And Related Other (See Comments)    Makes  him feel like he is having a heart attack   Protonix [Pantoprazole] Nausea Only and Other (See Comments)    headaches   Sulfa Antibiotics Rash    LABORATORY DATA:  I have reviewed the labs as listed.  CBC    Component Value Date/Time   WBC 8.7 06/12/2024 0838   RBC 3.95 (L) 06/12/2024 0838   HGB 14.4 06/12/2024 0838   HCT 40.9 06/12/2024 0838   PLT 345 06/12/2024 0838   MCV 103.5 (H) 06/12/2024 0838   MCH 36.5 (H) 06/12/2024 0838   MCHC 35.2 06/12/2024 0838   RDW 12.3 06/12/2024 0838   LYMPHSABS 2.3 06/12/2024 0838   MONOABS 0.9 06/12/2024 0838   EOSABS 0.5 06/12/2024 0838   BASOSABS 0.1 06/12/2024 0838      Latest Ref Rng & Units 01/25/2024    2:25 PM 03/12/2022    8:31 AM 05/27/2021    8:52 AM  CMP  Glucose 70 - 99 mg/dL 886  892  883   BUN 8 - 23 mg/dL 12  14  15    Creatinine 0.61 - 1.24 mg/dL 8.53  8.66  8.61   Sodium 135 - 145 mmol/L 126  132  132   Potassium 3.5 - 5.1 mmol/L 3.5  3.7  3.3   Chloride 98 - 111 mmol/L 89  92  94   CO2 22 - 32 mmol/L 27  30  27    Calcium 8.9 - 10.3 mg/dL 9.1  9.1  9.4   Total Protein 6.5 - 8.1 g/dL 7.3  7.8    Total Bilirubin 0.0 - 1.2 mg/dL 0.6  0.8    Alkaline Phos 38 - 126 U/L 56  77    AST 15 - 41 U/L 29  30    ALT 0 - 44 U/L 15  22  DIAGNOSTIC IMAGING:  I have independently reviewed the relevant imaging and discussed with the patient.   WRAP UP:  All questions were answered. The patient knows to call the clinic with any problems, questions or concerns.  Medical decision making: Moderate  Time spent on visit: I spent 20 minutes counseling the patient face to face. The total time spent in the appointment was 30 minutes and more than 50% was on counseling.  Pleasant CHRISTELLA Barefoot, PA-C  06/19/24 10:20 AM

## 2024-06-19 ENCOUNTER — Inpatient Hospital Stay (HOSPITAL_BASED_OUTPATIENT_CLINIC_OR_DEPARTMENT_OTHER): Admitting: Physician Assistant

## 2024-06-19 VITALS — BP 120/65 | HR 77 | Temp 97.7°F | Resp 19 | Wt 198.8 lb

## 2024-06-19 DIAGNOSIS — R768 Other specified abnormal immunological findings in serum: Secondary | ICD-10-CM | POA: Diagnosis not present

## 2024-06-19 DIAGNOSIS — Z79899 Other long term (current) drug therapy: Secondary | ICD-10-CM | POA: Diagnosis not present

## 2024-06-19 DIAGNOSIS — D7589 Other specified diseases of blood and blood-forming organs: Secondary | ICD-10-CM

## 2024-06-19 DIAGNOSIS — D72822 Plasmacytosis: Secondary | ICD-10-CM | POA: Diagnosis not present

## 2024-06-19 DIAGNOSIS — Z801 Family history of malignant neoplasm of trachea, bronchus and lung: Secondary | ICD-10-CM | POA: Diagnosis not present

## 2024-06-19 DIAGNOSIS — N183 Chronic kidney disease, stage 3 unspecified: Secondary | ICD-10-CM | POA: Diagnosis not present

## 2024-06-19 DIAGNOSIS — I129 Hypertensive chronic kidney disease with stage 1 through stage 4 chronic kidney disease, or unspecified chronic kidney disease: Secondary | ICD-10-CM | POA: Diagnosis not present

## 2024-06-19 NOTE — Patient Instructions (Addendum)
 [facility] at [facility] **VISIT SUMMARY & IMPORTANT INSTRUCTIONS **   You were seen today by Pleasant Barefoot PA-C for your large red blood cells (macrocytosis).    MACROCYTOSIS: You have had slightly large red blood cells since at least 2015.   We do NOT suspect any cancerous blood conditions at this time, based on the fact that your red blood cells have been large but stable for the past 10 years.  If your large red blood cells were due to cancer, we would expect you to also have some associated anemia, low platelets, or low white blood cells. Your large red blood cells are most likely related to one of the following: Fatty liver infiltration Medication side effect (triamterene )  Continue taking your vitamin and mineral supplements (including copper , folate, and B12).  Your primary care doctor can check these levels at least once a year and adjust your doses as needed.  You do not need any follow-up visits at the Cancer Center at this time.  However, if you develop any low blood cells (low white blood cells, anemia/low hemoglobin, or low platelets), you should be referred back to us  for further evaluation.   ** Thank you for trusting me with your healthcare!  I strive to provide all of my patients with quality care at each visit.  If you receive a survey for this visit, I would be so grateful to you for taking the time to provide feedback.  Thank you in advance!  ~ Glennie Rodda                   Dr. Alean Stands   &   Pleasant Barefoot, PA-C   - - - - - - - - - - - - - - - - - -    Thank you for choosing [facility] at [facility] to provide your oncology and hematology care.  To afford each patient quality time with our provider, please arrive at least 15 minutes before your scheduled appointment time.   If you have a lab appointment with the Cancer Center please come in thru the Main Entrance and check in at the main information  desk.  You need to re-schedule your appointment should you arrive 10 or more minutes late.  We strive to give you quality time with our providers, and arriving late affects you and other patients whose appointments are after yours.  Also, if you no show three or more times for appointments you may be dismissed from the clinic at the providers discretion.     Again, thank you for choosing [facility].  Our hope is that these requests will decrease the amount of time that you wait before being seen by our physicians.       _____________________________________________________________  Should you have questions after your visit to [facility], please contact our office at [phone] and follow the prompts.  Our office hours are 8:00 a.m. and 4:30 p.m. Monday - Friday.  Please note that voicemails left after 4:00 p.m. may not be returned until the following business day.  We are closed weekends and major holidays.  You do have access to a nurse 24-7, just call the main number to the clinic [phone] and do not press any options, hold on the line and a nurse will answer the phone.    For prescription refill requests, have your pharmacy contact our office and allow 72 hours.

## 2024-07-20 ENCOUNTER — Encounter: Payer: Self-pay | Admitting: Internal Medicine

## 2024-08-07 DIAGNOSIS — E039 Hypothyroidism, unspecified: Secondary | ICD-10-CM | POA: Diagnosis not present

## 2024-08-07 DIAGNOSIS — R7301 Impaired fasting glucose: Secondary | ICD-10-CM | POA: Diagnosis not present

## 2024-08-07 DIAGNOSIS — N401 Enlarged prostate with lower urinary tract symptoms: Secondary | ICD-10-CM | POA: Diagnosis not present

## 2024-08-07 DIAGNOSIS — I10 Essential (primary) hypertension: Secondary | ICD-10-CM | POA: Diagnosis not present

## 2024-08-07 DIAGNOSIS — E782 Mixed hyperlipidemia: Secondary | ICD-10-CM | POA: Diagnosis not present

## 2024-08-07 DIAGNOSIS — E559 Vitamin D deficiency, unspecified: Secondary | ICD-10-CM | POA: Diagnosis not present

## 2024-08-13 DIAGNOSIS — N401 Enlarged prostate with lower urinary tract symptoms: Secondary | ICD-10-CM | POA: Diagnosis not present

## 2024-08-13 DIAGNOSIS — I129 Hypertensive chronic kidney disease with stage 1 through stage 4 chronic kidney disease, or unspecified chronic kidney disease: Secondary | ICD-10-CM | POA: Diagnosis not present

## 2024-08-13 DIAGNOSIS — E782 Mixed hyperlipidemia: Secondary | ICD-10-CM | POA: Diagnosis not present

## 2024-08-13 DIAGNOSIS — Z23 Encounter for immunization: Secondary | ICD-10-CM | POA: Diagnosis not present

## 2024-08-13 DIAGNOSIS — E875 Hyperkalemia: Secondary | ICD-10-CM | POA: Diagnosis not present

## 2024-08-13 DIAGNOSIS — E039 Hypothyroidism, unspecified: Secondary | ICD-10-CM | POA: Diagnosis not present

## 2024-08-13 DIAGNOSIS — R7301 Impaired fasting glucose: Secondary | ICD-10-CM | POA: Diagnosis not present

## 2024-08-13 DIAGNOSIS — E559 Vitamin D deficiency, unspecified: Secondary | ICD-10-CM | POA: Diagnosis not present

## 2024-08-13 DIAGNOSIS — N1832 Chronic kidney disease, stage 3b: Secondary | ICD-10-CM | POA: Diagnosis not present

## 2024-08-13 DIAGNOSIS — J4530 Mild persistent asthma, uncomplicated: Secondary | ICD-10-CM | POA: Diagnosis not present

## 2024-08-13 DIAGNOSIS — D472 Monoclonal gammopathy: Secondary | ICD-10-CM | POA: Diagnosis not present

## 2024-10-09 DIAGNOSIS — D225 Melanocytic nevi of trunk: Secondary | ICD-10-CM | POA: Diagnosis not present

## 2024-10-09 DIAGNOSIS — L821 Other seborrheic keratosis: Secondary | ICD-10-CM | POA: Diagnosis not present

## 2024-10-09 DIAGNOSIS — L570 Actinic keratosis: Secondary | ICD-10-CM | POA: Diagnosis not present

## 2024-10-09 DIAGNOSIS — D2230 Melanocytic nevi of unspecified part of face: Secondary | ICD-10-CM | POA: Diagnosis not present

## 2024-10-09 DIAGNOSIS — D485 Neoplasm of uncertain behavior of skin: Secondary | ICD-10-CM | POA: Diagnosis not present

## 2024-10-09 DIAGNOSIS — L820 Inflamed seborrheic keratosis: Secondary | ICD-10-CM | POA: Diagnosis not present

## 2024-10-09 DIAGNOSIS — Z86018 Personal history of other benign neoplasm: Secondary | ICD-10-CM | POA: Diagnosis not present

## 2024-10-09 DIAGNOSIS — Z85828 Personal history of other malignant neoplasm of skin: Secondary | ICD-10-CM | POA: Diagnosis not present

## 2024-10-09 DIAGNOSIS — L578 Other skin changes due to chronic exposure to nonionizing radiation: Secondary | ICD-10-CM | POA: Diagnosis not present

## 2024-10-22 DIAGNOSIS — I10 Essential (primary) hypertension: Secondary | ICD-10-CM | POA: Diagnosis not present

## 2024-10-22 DIAGNOSIS — D7589 Other specified diseases of blood and blood-forming organs: Secondary | ICD-10-CM | POA: Diagnosis not present

## 2024-10-22 DIAGNOSIS — E119 Type 2 diabetes mellitus without complications: Secondary | ICD-10-CM | POA: Diagnosis not present

## 2024-10-22 DIAGNOSIS — D631 Anemia in chronic kidney disease: Secondary | ICD-10-CM | POA: Diagnosis not present

## 2024-10-22 DIAGNOSIS — R809 Proteinuria, unspecified: Secondary | ICD-10-CM | POA: Diagnosis not present

## 2024-10-22 DIAGNOSIS — N189 Chronic kidney disease, unspecified: Secondary | ICD-10-CM | POA: Diagnosis not present

## 2024-10-22 DIAGNOSIS — E211 Secondary hyperparathyroidism, not elsewhere classified: Secondary | ICD-10-CM | POA: Diagnosis not present

## 2024-10-22 DIAGNOSIS — E559 Vitamin D deficiency, unspecified: Secondary | ICD-10-CM | POA: Diagnosis not present

## 2024-12-03 ENCOUNTER — Other Ambulatory Visit: Payer: Self-pay | Admitting: Internal Medicine
